# Patient Record
Sex: Male | Born: 1940 | Race: Asian | Hispanic: No | Marital: Married | Smoking: Never smoker
Health system: Southern US, Community
[De-identification: ages and names within clinical notes are randomized; demographics above are authoritative.]

## PROBLEM LIST (undated history)

## (undated) DIAGNOSIS — K5000 Crohn's disease of small intestine without complications: Secondary | ICD-10-CM

## (undated) DIAGNOSIS — I10 Essential (primary) hypertension: Secondary | ICD-10-CM

## (undated) DIAGNOSIS — E785 Hyperlipidemia, unspecified: Secondary | ICD-10-CM

## (undated) DIAGNOSIS — K3580 Unspecified acute appendicitis: Secondary | ICD-10-CM

## (undated) DIAGNOSIS — N400 Enlarged prostate without lower urinary tract symptoms: Secondary | ICD-10-CM

## (undated) HISTORY — PX: HERNIA REPAIR: SHX51

## (undated) HISTORY — DX: Essential (primary) hypertension: I10

## (undated) HISTORY — DX: Unspecified acute appendicitis: K35.80

## (undated) HISTORY — DX: Hyperlipidemia, unspecified: E78.5

## (undated) HISTORY — DX: Benign prostatic hyperplasia without lower urinary tract symptoms: N40.0

---

## 1998-03-08 ENCOUNTER — Ambulatory Visit (HOSPITAL_COMMUNITY): Admission: RE | Admit: 1998-03-08 | Discharge: 1998-03-08 | Payer: Self-pay | Admitting: *Deleted

## 1998-03-20 ENCOUNTER — Ambulatory Visit (HOSPITAL_COMMUNITY): Admission: RE | Admit: 1998-03-20 | Discharge: 1998-03-20 | Payer: Self-pay | Admitting: Family Medicine

## 1998-04-03 ENCOUNTER — Ambulatory Visit (HOSPITAL_COMMUNITY): Admission: RE | Admit: 1998-04-03 | Discharge: 1998-04-03 | Payer: Self-pay | Admitting: Family Medicine

## 1998-05-02 ENCOUNTER — Ambulatory Visit (HOSPITAL_COMMUNITY): Admission: RE | Admit: 1998-05-02 | Discharge: 1998-05-02 | Payer: Self-pay | Admitting: Family Medicine

## 2000-02-04 ENCOUNTER — Ambulatory Visit (HOSPITAL_COMMUNITY): Admission: RE | Admit: 2000-02-04 | Discharge: 2000-02-04 | Payer: Self-pay | Admitting: *Deleted

## 2000-02-04 ENCOUNTER — Encounter: Payer: Self-pay | Admitting: *Deleted

## 2007-06-28 ENCOUNTER — Ambulatory Visit (HOSPITAL_BASED_OUTPATIENT_CLINIC_OR_DEPARTMENT_OTHER): Admission: RE | Admit: 2007-06-28 | Discharge: 2007-06-28 | Payer: Self-pay | Admitting: Surgery

## 2011-01-20 NOTE — Op Note (Signed)
NAMEPSALM, Erik Aguirre                   ACCOUNT NO.:  [id number]   MEDICAL RECORD NO.:  [id number]          PATIENT TYPE:  AMB   LOCATION:  [facility]                         FACILITY:  [facility]   PHYSICIAN:  Wilmon Arms. Corliss Skains, M.D. DATE OF BIRTH:  Aug 25, 1941   DATE OF PROCEDURE:  06/28/2007  DATE OF DISCHARGE:                               OPERATIVE REPORT   PREOPERATIVE DIAGNOSIS:  Recurrent left inguinal hernia.   POSTOPERATIVE DIAGNOSIS:  Recurrent left inguinal hernia.   PROCEDURE PERFORMED:  Open repair of left inguinal hernia with mesh   SURGEON:  Wilmon Arms. Corliss Skains, M.D., FACS   ANESTHESIA:  General.   INDICATIONS:  The patient is a 70 year old male who underwent a  laparoscopic left inguinal hernia repair in [place], [place] in 1995.  Apparently the patient recurred his hernia in 2000 and noted a bulge at  that time.  For some reason he chose not to have anything done about it  for the last eight years.  It has gradually become much larger and has  caused more discomfort.  He finally presents for repair.   DESCRIPTION OF PROCEDURE:  The patient brought to the operating room,  placed in supine position on the operating room table.  After an  adequate level of general anesthesia was obtained, the patient's left  groin was shaved, prepped with Betadine and draped in sterile fashion.  A time-out was taken ensure proper patient, proper procedure.  The  patient's left groin was infiltrated with 0.25% Marcaine.  Then an  incision was made above the inguinal ligament.  Dissection was carried  down the external oblique fascia.  The fascia was examined and there was  a split noted in the external oblique fascia.  This area was opened  along direction of its fibers down to the external ring.  Blunt  dissection was used to dissect around the spermatic cord.  This was  retracted with a Penrose drain.  A large direct defect was noted.  Careful blunt as well as cautery dissection was used to free up  the  direct hernia sac.  This was densely adherent to the spermatic cord.  The spermatic cord was carefully skeletonized.  I could identify a  hernia sac which seemed to be ligated with a metallic clip.  However,  this appeared to be the distal portion of the hernia sac heading down  into the scrotum.  We did not have records from the patient's previous  operation but it appears that they may have divided his indirect hernia  sac and ligated it with a clip.  This portion of the hernia sac was  allowed to retract back down into the scrotum.  We further skeletonized  the spermatic cord looking for more proximal internal hernia sac and  none was identified.  Therefore we repaired the direct defect by  imbricating the floor of the inguinal canal with a 0 PDS suture.  3  inches x 6 inches Ultra Pro mesh was cut into keyhole shape and used to  reinforce the floor of the inguinal canal.  It  was secured beginning at  the pubic tubercle with 2-0 Vicryl sutures.  This was attached to the  shelving edge inferiorly and the internal oblique fascia superiorly.  The tails were tucked around the spermatic cord, sewn together and  tucked underneath the external oblique fascia.  The fascia was  reapproximated with 2-0 Vicryl in running fashion.  3-0 Vicryls used to  close the subcutaneous tissues and 4-0 Monocryl was used to close the  skin.  Steri-Strips and clean dressings were applied.  The patient was  then extubated and brought to recovery in stable condition.  All sponge,  instrument, needle counts were correct.      Wilmon Arms. Tsuei, M.D.  Electronically Signed     MKT/MEDQ  D:  06/28/2007  T:  06/29/2007  Job:  [id number]

## 2011-06-17 LAB — I-STAT 8, (EC8 V) (CONVERTED LAB)
Acid-Base Excess: 2
BUN: 25 — ABNORMAL HIGH
Bicarbonate: 27.9 — ABNORMAL HIGH
Chloride: 105
Glucose, Bld: 100 — ABNORMAL HIGH
HCT: 45
Hemoglobin: 15.3
Operator id: 268271
Potassium: 4.2
Sodium: 139
TCO2: 29
pCO2, Ven: 48.2
pH, Ven: 7.371 — ABNORMAL HIGH

## 2013-03-17 DIAGNOSIS — N138 Other obstructive and reflux uropathy: Secondary | ICD-10-CM | POA: Insufficient documentation

## 2013-03-17 DIAGNOSIS — N401 Enlarged prostate with lower urinary tract symptoms: Secondary | ICD-10-CM | POA: Insufficient documentation

## 2013-03-17 DIAGNOSIS — N400 Enlarged prostate without lower urinary tract symptoms: Secondary | ICD-10-CM | POA: Insufficient documentation

## 2013-03-17 DIAGNOSIS — R350 Frequency of micturition: Secondary | ICD-10-CM | POA: Insufficient documentation

## 2013-08-08 ENCOUNTER — Encounter: Payer: Self-pay | Admitting: Internal Medicine

## 2013-08-09 ENCOUNTER — Ambulatory Visit (INDEPENDENT_AMBULATORY_CARE_PROVIDER_SITE_OTHER): Payer: Medicare Other | Admitting: Internal Medicine

## 2013-08-09 ENCOUNTER — Encounter: Payer: Self-pay | Admitting: Internal Medicine

## 2013-08-09 ENCOUNTER — Encounter (INDEPENDENT_AMBULATORY_CARE_PROVIDER_SITE_OTHER): Payer: Self-pay

## 2013-08-09 VITALS — BP 140/84 | HR 74 | Temp 97.9°F | Ht 65.0 in | Wt 123.0 lb

## 2013-08-09 DIAGNOSIS — R058 Other specified cough: Secondary | ICD-10-CM

## 2013-08-09 DIAGNOSIS — R05 Cough: Secondary | ICD-10-CM

## 2013-08-09 DIAGNOSIS — R059 Cough, unspecified: Secondary | ICD-10-CM

## 2013-08-09 DIAGNOSIS — R918 Other nonspecific abnormal finding of lung field: Secondary | ICD-10-CM

## 2013-08-09 NOTE — Patient Instructions (Addendum)
The nodule is tiny and not likely to be anything at all but needs a limited CT scan in 6-12  months per Dr Andria Meuse (I will contact her to arrange)  As long as having any cough or throat congestion, take pepcid 20 mg after breakfast and bedtime  GERD (REFLUX)  is an extremely common cause of respiratory symptoms, many times with no significant heartburn at all.    It can be treated with medication, but also with lifestyle changes including avoidance of late meals, excessive alcohol, smoking cessation, and avoid fatty foods, chocolate, peppermint, colas, red wine, and acidic juices such as orange juice.  NO MINT OR MENTHOL PRODUCTS SO NO COUGH DROPS  USE SUGARLESS CANDY INSTEAD (jolley ranchers or Stover's)  NO OIL BASED VITAMINS - use powdered substitutes - NO FISH OIL

## 2013-08-09 NOTE — Progress Notes (Signed)
   Subjective:    Patient ID: Erik Aguirre, male    DOB: [birth year]   MRN: [id number]  HPI  72 yo male montagnard never smoker  referred to pulmonary clinic 08/09/13 by Dr Erik Aguirre for cough since Oct 2014 with abn cxr.    08/09/2013 1st [facility] office visit/ Erik Aguirre cc acute onset cough Oct 14th 2014  worse when lie down productive of yellow mucus better p abx but still some intermittent throat congestion at bedtime.    No obvious day to day or daytime variabilty or assoc sobor cp or chest tightness, subjective wheeze overt sinus or hb symptoms. No unusual exp hx or h/o childhood pna/ asthma or knowledge of premature birth.  Sleeping ok without nocturnal  or early am exacerbation  of respiratory  c/o's or need for noct saba. Also denies any obvious fluctuation of symptoms with weather or environmental changes or other aggravating or alleviating factors except as outlined above   Current Medications, Allergies, Complete Past Medical History, Past Surgical History, Family History, and Social History were reviewed in [employer] record.         Review of Systems  Constitutional: Positive for appetite change and unexpected weight change. Negative for fever, chills and activity change.  HENT: Negative for congestion, dental problem, postnasal drip, rhinorrhea, sneezing, sore throat, trouble swallowing and voice change.   Eyes: Negative for visual disturbance.  Respiratory: Negative for cough, choking and shortness of breath.   Cardiovascular: Negative for chest pain and leg swelling.  Gastrointestinal: Negative for nausea, vomiting and abdominal pain.  Genitourinary: Negative for difficulty urinating.  Musculoskeletal: Negative for arthralgias.  Skin: Negative for rash.  Psychiatric/Behavioral: Negative for behavioral problems and confusion.       Objective:   Physical Exam  amb vietnamese male nad  Wt Readings from Last 3 Encounters:  08/09/13  123 lb (55.792 kg)      HEENT: nl dentition, turbinates, and orophanx. Nl external ear canals without cough reflex   NECK :  without JVD/Nodes/TM/ nl carotid upstrokes bilaterally   LUNGS: no acc muscle use, clear to A and P bilaterally without cough on insp or exp maneuvers   CV:  RRR  no s3 or murmur or increase in P2, no edema   ABD:  soft and nontender with nl excursion in the supine position. No bruits or organomegaly, bowel sounds nl  MS:  warm without deformities, calf tenderness, cyanosis or clubbing  SKIN: warm and dry without lesions    NEURO:  alert, approp, no deficits         Assessment & Plan:

## 2013-08-11 DIAGNOSIS — R918 Other nonspecific abnormal finding of lung field: Secondary | ICD-10-CM | POA: Insufficient documentation

## 2013-08-11 DIAGNOSIS — R058 Other specified cough: Secondary | ICD-10-CM | POA: Insufficient documentation

## 2013-08-11 DIAGNOSIS — R05 Cough: Secondary | ICD-10-CM | POA: Insufficient documentation

## 2013-08-11 NOTE — Assessment & Plan Note (Signed)
See CT Chest 07/31/13 from [facility] c/w granulomas  Very unlikely to be Ca in this never smoker with central calcification in the larger nodulee but to be sure reasonable to repeat CT in 6-12 months (placed in our tickle file to be sure this gets done but will derfer to Dr Justice Britain to schedule through [facility] again for apples to apples comparison

## 2013-08-11 NOTE — Assessment & Plan Note (Addendum)
Classic Upper airway cough syndrome, so named because it's frequently impossible to sort out how much is  CR/sinusitis with freq throat clearing (which can be related to primary GERD)   vs  causing  secondary (" extra esophageal")  GERD from wide swings in gastric pressure that occur with throat clearing, often  promoting self use of mint and menthol lozenges that reduce the lower esophageal sphincter tone and exacerbate the problem further in a cyclical fashion.   These are the same pts (now being labeled as having "irritable larynx syndrome" by some cough centers) who not infrequently have a history of having failed to tolerate ace inhibitors,  dry powder inhalers or biphosphonates or report having atypical reflux symptoms that don't respond to standard doses of PPI , and are easily confused as having aecopd or asthma flares by even experienced allergists/ pulmonologists.   rec add pepcid prn and follow diet, f/u if not better   See instructions for specific recommendations which were reviewed directly with the patient who was given a copy with highlighter outlining the key components.

## 2013-08-22 ENCOUNTER — Ambulatory Visit (INDEPENDENT_AMBULATORY_CARE_PROVIDER_SITE_OTHER): Payer: Medicare Other | Admitting: Family Medicine

## 2013-08-22 VITALS — BP 130/72 | HR 72 | Temp 97.7°F | Resp 18 | Ht 65.5 in | Wt 120.0 lb

## 2013-08-22 DIAGNOSIS — R634 Abnormal weight loss: Secondary | ICD-10-CM

## 2013-08-22 DIAGNOSIS — R195 Other fecal abnormalities: Secondary | ICD-10-CM

## 2013-08-22 DIAGNOSIS — R05 Cough: Secondary | ICD-10-CM

## 2013-08-22 DIAGNOSIS — Z8639 Personal history of other endocrine, nutritional and metabolic disease: Secondary | ICD-10-CM

## 2013-08-22 DIAGNOSIS — Z8709 Personal history of other diseases of the respiratory system: Secondary | ICD-10-CM

## 2013-08-22 DIAGNOSIS — Z87898 Personal history of other specified conditions: Secondary | ICD-10-CM

## 2013-08-22 DIAGNOSIS — Z862 Personal history of diseases of the blood and blood-forming organs and certain disorders involving the immune mechanism: Secondary | ICD-10-CM

## 2013-08-22 DIAGNOSIS — R059 Cough, unspecified: Secondary | ICD-10-CM

## 2013-08-22 DIAGNOSIS — E782 Mixed hyperlipidemia: Secondary | ICD-10-CM

## 2013-08-22 LAB — POCT CBC
Granulocyte percent: 68.3 %G (ref 37–80)
HCT, POC: 48.8 % (ref 43.5–53.7)
Hemoglobin: 14.9 g/dL (ref 14.1–18.1)
Lymph, poc: 1.9 (ref 0.6–3.4)
MCH, POC: 30.1 pg (ref 27–31.2)
MCHC: 30.5 g/dL — AB (ref 31.8–35.4)
MCV: 98.5 fL — AB (ref 80–97)
MID (cbc): 0.4 (ref 0–0.9)
MPV: 8.5 fL (ref 0–99.8)
POC Granulocyte: 4.9 (ref 2–6.9)
POC LYMPH PERCENT: 26.4 %L (ref 10–50)
POC MID %: 5.3 %M (ref 0–12)
Platelet Count, POC: 270 10*3/uL (ref 142–424)
RBC: 4.95 M/uL (ref 4.69–6.13)
RDW, POC: 14.8 %
WBC: 7.2 10*3/uL (ref 4.6–10.2)

## 2013-08-22 LAB — COMPREHENSIVE METABOLIC PANEL
ALT: 14 U/L (ref 0–53)
Albumin: 4.1 g/dL (ref 3.5–5.2)
CO2: 30 mEq/L (ref 19–32)
Calcium: 9.4 mg/dL (ref 8.4–10.5)
Chloride: 102 mEq/L (ref 96–112)
Potassium: 4.3 mEq/L (ref 3.5–5.3)
Sodium: 142 mEq/L (ref 135–145)
Total Protein: 6.7 g/dL (ref 6.0–8.3)

## 2013-08-22 LAB — LIPID PANEL: Cholesterol: 199 mg/dL (ref 0–200)

## 2013-08-22 LAB — IFOBT (OCCULT BLOOD): IFOBT: POSITIVE

## 2013-08-22 LAB — POCT URINALYSIS DIPSTICK
Bilirubin, UA: NEGATIVE
Glucose, UA: NEGATIVE
Ketones, UA: NEGATIVE
Leukocytes, UA: NEGATIVE
Nitrite, UA: NEGATIVE
Protein, UA: NEGATIVE
Spec Grav, UA: 1.02
Urobilinogen, UA: 0.2
pH, UA: 7

## 2013-08-22 LAB — POCT UA - MICROSCOPIC ONLY
Bacteria, U Microscopic: NEGATIVE
Casts, Ur, LPF, POC: NEGATIVE
Crystals, Ur, HPF, POC: NEGATIVE
Mucus, UA: NEGATIVE
RBC, urine, microscopic: NEGATIVE
WBC, Ur, HPF, POC: NEGATIVE
Yeast, UA: NEGATIVE

## 2013-08-22 LAB — GLUCOSE, POCT (MANUAL RESULT ENTRY): POC Glucose: 102 mg/dl — AB (ref 70–99)

## 2013-08-22 LAB — POCT GLYCOSYLATED HEMOGLOBIN (HGB A1C): Hemoglobin A1C: 6

## 2013-08-22 NOTE — Progress Notes (Signed)
Subjective: 72 year old [place] man who is here with a history of having lost about 10 pounds of weight over the last year unexplained. He eats well. He does not feel bad. He has been to a Dr. Andria Meuse who saw him previously, but due to changes in his insurance that Dr. is no longer in his network so he is coming here. 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 The patient had been set to see Dr. Sherene Sires who recommended a followup CT in a proximal to 6 months. The patient also had a chronic cough. Dr. Sherene Sires placed him on some Pepcid for that, and it is improved some though he still has a little problem there. He has no other things going on that he knows of to cause him to lose weight. He has been told he might be a borderline diabetic. He also does have some nocturia 2 or 3 times.  Dr. Elnoria Howard did a screening colonoscopy on him in 2011 and was told to recheck in 10 years  HEENT unremarkable with no complaints. Cardiovascular no complaints. Respiratory no complaints except as noted above. GI no complaints. Eats well. GU has to get up 2-3 times at night to urinate but otherwise is well. Musculoskeletal unremarkable and he does get regular exercise. Dermatologic unremarkable  Objective: Pleasant alert gentleman in no major distress. His TMs are normal. Eyes PERRLA. He does have xanthelasma on both of his lower orbital rims, more on the left than the right. His throat is clear. Neck supple without significant nodes. Chest is clear to auscultation. Heart regular without murmurs. Abdomen soft without masses or tenderness. Good femoral pulses. Normal male external genitalia with testes descended. No hernias are noted. No masses noted. Digital rectal exam reveals prostate gland B. mildly enlarged but no nodules. Stool was checked for occult blood. Extremities are unremarkable.  Assessment: Weight loss History of pulmonary nodule, to be rechecked  in 6 months History of hyperglycemia Xanthelasma Mild BPH with mild nocturia Stool positive for occult blood ( noted he has had a colonoscopy 3 years ago)  Plan: Check labs including a CBC, glucose, he was sure, hemoglobin A1c, urinalysis, C. met, TSH, and lipids to assess the weight loss.  Results for orders placed in visit on 08/22/13  POCT CBC      Result Value Range   WBC 7.2  4.6 - 10.2 K/uL   Lymph, poc 1.9  0.6 - 3.4   POC LYMPH PERCENT 26.4  10 - 50 %L   MID (cbc) 0.4  0 - 0.9   POC MID % 5.3  0 - 12 %M   POC Granulocyte 4.9  2 - 6.9   Granulocyte percent 68.3  37 - 80 %G   RBC 4.95  4.69 - 6.13 M/uL   Hemoglobin 14.9  14.1 - 18.1 g/dL   HCT, POC 16.1  09.6 - 53.7 %   MCV 98.5 (*) 80 - 97 fL   MCH, POC 30.1  27 - 31.2 pg   MCHC 30.5 (*) 31.8 - 35.4 g/dL   RDW, POC 04.5     Platelet Count, POC 270  142 - 424 K/uL   MPV 8.5  0 - 99.8 fL  GLUCOSE, POCT (MANUAL RESULT ENTRY)      Result Value Range   POC Glucose 102 (*) 70 - 99 mg/dl  IFOBT (OCCULT BLOOD)      Result Value Range  IFOBT Positive    POCT GLYCOSYLATED HEMOGLOBIN (HGB A1C)      Result Value Range   Hemoglobin A1C 6.0    POCT UA - MICROSCOPIC ONLY      Result Value Range   WBC, Ur, HPF, POC neg     RBC, urine, microscopic neg     Bacteria, U Microscopic neg     Mucus, UA neg     Epithelial cells, urine per micros 0-3     Crystals, Ur, HPF, POC neg     Casts, Ur, LPF, POC neg     Yeast, UA neg    POCT URINALYSIS DIPSTICK      Result Value Range   Color, UA yellow     Clarity, UA clear     Glucose, UA neg     Bilirubin, UA neg     Ketones, UA neg     Spec Grav, UA 1.020     Blood, UA trace-lysed     pH, UA 7.0     Protein, UA neg     Urobilinogen, UA 0.2     Nitrite, UA neg     Leukocytes, UA Negative     Return in 3 months  Even though the occult blood was positive I do not feel like additional: Workup is indicated yet since he had a normal exam 3 years ago. However if he continues to  lose weight that may become necessary.

## 2013-08-22 NOTE — Patient Instructions (Signed)
I will let you know the rest of your tests in a few days when they come back from the lab   Return in 3 months for a recheck  Please try to make yourself he asked her her, especially things like more rice or more fried foods which will give you more calories.  Return if worse at any time.

## 2013-08-24 ENCOUNTER — Encounter: Payer: Self-pay | Admitting: Family Medicine

## 2013-08-28 ENCOUNTER — Telehealth: Payer: Self-pay | Admitting: Family Medicine

## 2013-08-28 NOTE — Telephone Encounter (Signed)
Patient call about labs could you please look at them so we can call patient about labs

## 2013-08-29 NOTE — Telephone Encounter (Signed)
Call: Call patient. The labs were good overall. He needs to be drinking more water. Mail them a copy of the labs. Return at any time if further problems

## 2013-08-30 NOTE — Telephone Encounter (Signed)
Pt.notified

## 2013-09-06 ENCOUNTER — Ambulatory Visit (INDEPENDENT_AMBULATORY_CARE_PROVIDER_SITE_OTHER): Payer: Medicare Other | Admitting: Family Medicine

## 2013-09-06 VITALS — BP 130/80 | HR 64 | Temp 98.0°F | Resp 18 | Ht 64.75 in | Wt 120.0 lb

## 2013-09-06 DIAGNOSIS — R059 Cough, unspecified: Secondary | ICD-10-CM

## 2013-09-06 DIAGNOSIS — R05 Cough: Secondary | ICD-10-CM

## 2013-09-06 MED ORDER — HYDROCODONE-HOMATROPINE 5-1.5 MG/5ML PO SYRP: 5.0000 mL | ORAL_SOLUTION | ORAL | Status: DC | PRN

## 2013-09-06 MED ORDER — AMOXICILLIN 875 MG PO TABS: 875.0000 mg | ORAL_TABLET | Freq: Two times a day (BID) | ORAL | Status: DC

## 2013-09-06 NOTE — Patient Instructions (Addendum)
Stop the Pepcid for now. If you're doing worse resumed taking it.  Take the cough syrup 1 teaspoon every 4-6 hours when needed for coughing. It will make you sleepy, so best to use at nighttime.  Get plenty of rest and drink lots of water, tea, juice  If you're not feeling better by Friday, began taking the antibiotic amoxicillin. Do not get it filled yet, but wait to see if you're doing better with just the cough syrup.

## 2013-09-06 NOTE — Progress Notes (Signed)
Subjective: 72 year old patient known to me who started having a cough on Saturday. Buddy daily and today it is been worse. The cough bothers him at nighttime. He is productive of some slight phlegm. He does not smoke. He does not feel bad or have a sore throat.  Last time he was in here we were concerned about his weight loss. He has not lost any more weight in the last couple of weeks which is encouraging.  Objective: TMs are normal. Throat clear but mildly erythematous neck supple without significant nodes. Chest is clear. Heart regular without murmurs.  Assessment: Cough, probable viral etiology  Plan: Hycodan cough syrup Drink lots of fluids If not improving by Friday we will start him on an antibiotic but I do not think it is necessary at this time.

## 2013-12-20 ENCOUNTER — Ambulatory Visit (INDEPENDENT_AMBULATORY_CARE_PROVIDER_SITE_OTHER): Payer: Medicare HMO | Admitting: Family Medicine

## 2013-12-20 VITALS — BP 128/80 | HR 80 | Temp 97.3°F | Resp 16 | Ht 64.5 in | Wt 123.4 lb

## 2013-12-20 DIAGNOSIS — R9389 Abnormal findings on diagnostic imaging of other specified body structures: Secondary | ICD-10-CM

## 2013-12-20 DIAGNOSIS — R7309 Other abnormal glucose: Secondary | ICD-10-CM

## 2013-12-20 DIAGNOSIS — I10 Essential (primary) hypertension: Secondary | ICD-10-CM

## 2013-12-20 DIAGNOSIS — K921 Melena: Secondary | ICD-10-CM

## 2013-12-20 LAB — BASIC METABOLIC PANEL
BUN: 15 mg/dL (ref 6–23)
CO2: 29 mEq/L (ref 19–32)
CREATININE: 1.2 mg/dL (ref 0.50–1.35)
Calcium: 9.5 mg/dL (ref 8.4–10.5)
Chloride: 102 mEq/L (ref 96–112)
GLUCOSE: 102 mg/dL — AB (ref 70–99)
Potassium: 4.9 mEq/L (ref 3.5–5.3)
Sodium: 139 mEq/L (ref 135–145)

## 2013-12-20 LAB — POCT GLYCOSYLATED HEMOGLOBIN (HGB A1C): Hemoglobin A1C: 6

## 2013-12-20 MED ORDER — ATENOLOL-CHLORTHALIDONE 50-25 MG PO TABS: 1.0000 | ORAL_TABLET | Freq: Every day | ORAL | Status: DC

## 2013-12-20 NOTE — Progress Notes (Signed)
[facility] [address], [place] [address] [phone]  Date:  12/20/2013   Name:  Erik MangoKien P Winemiller   DOB:  1940/11/06   MRN:  [id number]  PCP:  Dow AdolphSTEVENS, LISA LAJUANA, PA    Chief Complaint: Medication Refill   History of Present Illness:  Erik Aguirre is a 73 y.o. very pleasant male patient who presents with the following: needs medication refill of atenolol/ chlorthlidone.  Also states he needs follow-up from an abnormal chest x-ray that was taken 6 mths ago showing a nodule.  States cough has improved and he is no longer losing weight.  No complaints today.   Upon chart review it looks like he saw Dr. Sherene SiresWert in the fall, and the plan was to repeat a CT scan of his chest.  CT of his chest from [facility] from 07/2013 is scanned into chart and recommended a follow-up CT in 3 months.   Otherwise he is feeling well and has no other concerns today  Also noted that he did have a positive FOBT in the past and it does not appear that he has had a colonoscopy.   Patient Active Problem List   Diagnosis Date Noted  . 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 Upper airway cough syndrome 08/11/2013    Past Medical History  Diagnosis Date  . Hypertension     History reviewed. No pertinent past surgical history.  History  Substance Use Topics  . Smoking status: Never Smoker   . Smokeless tobacco: Never Used  . Alcohol Use: No    Family History  Problem Relation Age of Onset  . Throat cancer Brother   . Asthma Son     No Known Allergies  Medication list has been reviewed and updated.  Current Outpatient Prescriptions on File Prior to Visit  Medication Sig Dispense Refill  . atenolol-chlorthalidone (TENORETIC) 50-25 MG per tablet Take 1 tablet by mouth daily.      Marland Kitchen. amoxicillin (AMOXIL) 875 MG tablet Take 1 tablet (875 mg total) by mouth 2 (two) times daily.  14 tablet  0  . famotidine (PEPCID) 20 MG tablet Take 20 mg by mouth 2 (two) times daily.      Marland Kitchen.  HYDROcodone-homatropine (HYCODAN) 5-1.5 MG/5ML syrup Take 5 mLs by mouth every 4 (four) hours as needed for cough.  120 mL  0   No current facility-administered medications on file prior to visit.    Review of Systems: Gen: denies fevers, wt loss, night sweats Cardio - denies chest pain, palp  Resp- denies sob, cough, wheeze GI -denies nausea, vomiting, and bloody stools Ext- denies muscle pain or weakness  Wt Readings from Last 3 Encounters:  12/20/13 123 lb 6.4 oz (55.974 kg)  09/06/13 120 lb (54.432 kg)  08/22/13 120 lb (54.432 kg)     Physical Examination: Filed Vitals:   12/20/13 1057  BP: 128/80  Pulse: 80  Temp: 97.3 F (36.3 C)  Resp: 16   Filed Vitals:   12/20/13 1057  Height: 5' 4.5" (1.638 m)  Weight: 123 lb 6.4 oz (55.974 kg)   Body mass index is 20.86 kg/(m^2). Ideal Body Weight: Weight in (lb) to have BMI = 25: 147.6  GEN: WDWN, NAD, Non-toxic, A & O x 3, slim build, looks well HEENT: Atraumatic, Normocephalic. Neck supple. No masses, No LAD. External cholesterol deposits visible under both eyes Ears and Nose: No external deformity. CV: RRR, No M/G/R. No JVD. No thrill. No extra  heart sounds. PULM: CTA B, no wheezes, crackles, rhonchi. No retractions. No resp. distress. No accessory muscle use. EXTR: No c/c/e.  5/5 strength in upper and lower extremities.  PSYCH: Normally interactive. Conversant. Not depressed or anxious appearing.  Calm demeanor.   Results for orders placed in visit on 12/20/13  POCT GLYCOSYLATED HEMOGLOBIN (HGB A1C)      Result Value Ref Range   Hemoglobin A1C 6.0      Assessment and Plan:  HTN (hypertension) - Plan: atenolol-chlorthalidone (TENORETIC) 50-25 MG per tablet, Basic metabolic panel  Elevated glucose - Plan: POCT glycosylated hemoglobin (Hb A1C)  Blood in stool - Plan: Ambulatory referral to Gastroenterology  Abnormal chest CT - Plan: CT Chest Wo Contrast   1. Hypertension -  Refill atenolol/ chlorthalidone.   Check BMP.  BP looks fine.  2. Positive FOBT - refer to GI 3. Pulm nodules - repeat CT per pulmonologist recs  4. Reassured that his hemoglobin A1c is stable. Continue exercise and await BMP  Signed Abbe AmsterdamJessica Copland, MD

## 2013-12-20 NOTE — Patient Instructions (Addendum)
We will order your follow-up CT chest- we will give you a call about this.  Your blood pressure looks good- continue to take your medication and we will be in touch with your other labs asap  Your hemoglobin a1c continues to show that your glucose is borderline- keep working on your diet and we can check again in 6 months.

## 2013-12-21 ENCOUNTER — Encounter: Payer: Self-pay | Admitting: Family Medicine

## 2013-12-29 ENCOUNTER — Ambulatory Visit
Admission: RE | Admit: 2013-12-29 | Discharge: 2013-12-29 | Disposition: A | Discharge status: Home / Self Care | Payer: Commercial Managed Care - HMO | Source: Ambulatory Visit | Attending: Family Medicine | Admitting: Family Medicine

## 2013-12-29 DIAGNOSIS — R9389 Abnormal findings on diagnostic imaging of other specified body structures: Secondary | ICD-10-CM

## 2013-12-31 ENCOUNTER — Other Ambulatory Visit: Payer: Self-pay | Admitting: Family Medicine

## 2013-12-31 ENCOUNTER — Encounter: Payer: Self-pay | Admitting: Family Medicine

## 2013-12-31 DIAGNOSIS — R918 Other nonspecific abnormal finding of lung field: Secondary | ICD-10-CM

## 2014-01-09 ENCOUNTER — Ambulatory Visit (INDEPENDENT_AMBULATORY_CARE_PROVIDER_SITE_OTHER): Payer: Medicare HMO | Admitting: Family Medicine

## 2014-01-09 VITALS — BP 124/76 | HR 65 | Temp 97.6°F | Resp 16 | Ht 63.75 in | Wt 124.0 lb

## 2014-01-09 DIAGNOSIS — R05 Cough: Secondary | ICD-10-CM

## 2014-01-09 DIAGNOSIS — R059 Cough, unspecified: Secondary | ICD-10-CM

## 2014-01-09 LAB — POCT CBC
Granulocyte percent: 61.1 %G (ref 37–80)
HEMATOCRIT: 39.3 % — AB (ref 43.5–53.7)
HEMOGLOBIN: 12.8 g/dL — AB (ref 14.1–18.1)
Lymph, poc: 2.5 (ref 0.6–3.4)
MCH: 30.9 pg (ref 27–31.2)
MCHC: 32.6 g/dL (ref 31.8–35.4)
MCV: 95 fL (ref 80–97)
MID (cbc): 0.5 (ref 0–0.9)
MPV: 7.6 fL (ref 0–99.8)
POC Granulocyte: 4.8 (ref 2–6.9)
POC LYMPH PERCENT: 32.1 %L (ref 10–50)
POC MID %: 6.8 %M (ref 0–12)
Platelet Count, POC: 313 10*3/uL (ref 142–424)
RBC: 4.14 M/uL — AB (ref 4.69–6.13)
RDW, POC: 13.3 %
WBC: 7.8 10*3/uL (ref 4.6–10.2)

## 2014-01-09 MED ORDER — BENZONATATE 100 MG PO CAPS: 100.0000 mg | ORAL_CAPSULE | Freq: Three times a day (TID) | ORAL | Status: DC | PRN

## 2014-01-09 NOTE — Progress Notes (Signed)
[facility] [address], [place] [address] [phone]  Date:  01/09/2014   Name:  Erik Aguirre   DOB:  [birth year]   MRN:  [id number]  PCP:  Dow AdolphSTEVENS, LISA LAJUANA, PA    Chief Complaint: Cough   History of Present Illness:  Erik Aguirre is a 73 y.o. very pleasant male patient who presents with the following:  He was seen here about 3 weeks ago for a BP check.  He is here today wih a cough for about 5 days. He is coughing up some mucus.  He has not noted a fever or chills.  He is coughing more at night.   He had a chest CT last month.   He has not noted a ST, runny nose, or earache.  No GI symptoms   Patient Active Problem List   Diagnosis Date Noted  . 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 Upper airway cough syndrome 08/11/2013    Past Medical History  Diagnosis Date  . Hypertension     No past surgical history on file.  History  Substance Use Topics  . Smoking status: Never Smoker   . Smokeless tobacco: Never Used  . Alcohol Use: No    Family History  Problem Relation Age of Onset  . Throat cancer Brother   . Asthma Son     Allergies  Allergen Reactions  . Lisinopril Cough    Medication list has been reviewed and updated.  Current Outpatient Prescriptions on File Prior to Visit  Medication Sig Dispense Refill  . atenolol-chlorthalidone (TENORETIC) 50-25 MG per tablet Take 1 tablet by mouth daily.  90 tablet  3  . famotidine (PEPCID) 20 MG tablet Take 20 mg by mouth 2 (two) times daily.      Marland Kitchen. amoxicillin (AMOXIL) 875 MG tablet Take 1 tablet (875 mg total) by mouth 2 (two) times daily.  14 tablet  0  . HYDROcodone-homatropine (HYCODAN) 5-1.5 MG/5ML syrup Take 5 mLs by mouth every 4 (four) hours as needed for cough.  120 mL  0   No current facility-administered medications on file prior to visit.    Review of Systems:  As per HPI- otherwise negative.   Physical Examination: Filed Vitals:   01/09/14 1802  BP: 124/76   Pulse: 65  Temp: 97.6 F (36.4 C)  Resp: 16   Filed Vitals:   01/09/14 1802  Height: 5' 3.75" (1.619 m)  Weight: 124 lb (56.246 kg)   Body mass index is 21.46 kg/(m^2). Ideal Body Weight: Weight in (lb) to have BMI = 25: 144.2  GEN: WDWN, NAD, Non-toxic, A & O x 3, looks well, slim build HEENT: Atraumatic, Normocephalic. Neck supple. No masses, No LAD.  Bilateral TM wnl, oropharynx normal.  PEERL,EOMI.   Ears and Nose: No external deformity. CV: RRR, No M/G/R. No JVD. No thrill. No extra heart sounds. PULM: CTA B, no wheezes, crackles, rhonchi. No retractions. No resp. distress. No accessory muscle use. EXTR: No c/c/e NEURO Normal gait.  PSYCH: Normally interactive. Conversant. Not depressed or anxious appearing.  Calm demeanor.   Results for orders placed in visit on 01/09/14  POCT CBC      Result Value Ref Range   WBC 7.8  4.6 - 10.2 K/uL   Lymph, poc 2.5  0.6 - 3.4   POC LYMPH PERCENT 32.1  10 - 50 %L   MID (cbc) 0.5  0 - 0.9   POC MID % 6.8  0 - 12 %M   POC Granulocyte 4.8  2 - 6.9   Granulocyte percent 61.1  37 - 80 %G   RBC 4.14 (*) 4.69 - 6.13 M/uL   Hemoglobin 12.8 (*) 14.1 - 18.1 g/dL   HCT, POC 56.239.3 (*) 13.043.5 - 53.7 %   MCV 95.0  80 - 97 fL   MCH, POC 30.9  27 - 31.2 pg   MCHC 32.6  31.8 - 35.4 g/dL   RDW, POC 86.513.3     Platelet Count, POC 313  142 - 424 K/uL   MPV 7.6  0 - 99.8 fL    Assessment and Plan: Cough - Plan: POCT CBC, benzonatate (TESSALON) 100 MG capsule  Likely viral illness.  Tessalon as needed.  He has been referred to GI as he is anemic.    See patient instructions for more details.    Signed Abbe AmsterdamJessica Copland, MD

## 2014-01-09 NOTE — Patient Instructions (Signed)
Use the tessalon perles as needed for cough.  It seems that you have a viral infection and will likely be better in a few days.  Let me know if you do not feel better in the next few days- Sooner if worse.

## 2014-01-22 ENCOUNTER — Encounter: Payer: Self-pay | Admitting: Family Medicine

## 2014-07-02 ENCOUNTER — Ambulatory Visit (INDEPENDENT_AMBULATORY_CARE_PROVIDER_SITE_OTHER): Payer: Medicare HMO | Admitting: Family Medicine

## 2014-07-02 VITALS — BP 128/76 | HR 64 | Temp 97.4°F | Resp 16 | Ht 66.0 in | Wt 127.4 lb

## 2014-07-02 DIAGNOSIS — I10 Essential (primary) hypertension: Secondary | ICD-10-CM

## 2014-07-02 DIAGNOSIS — I152 Hypertension secondary to endocrine disorders: Secondary | ICD-10-CM | POA: Insufficient documentation

## 2014-07-02 DIAGNOSIS — E1159 Type 2 diabetes mellitus with other circulatory complications: Secondary | ICD-10-CM | POA: Insufficient documentation

## 2014-07-02 DIAGNOSIS — R7303 Prediabetes: Secondary | ICD-10-CM

## 2014-07-02 DIAGNOSIS — E785 Hyperlipidemia, unspecified: Secondary | ICD-10-CM

## 2014-07-02 DIAGNOSIS — E119 Type 2 diabetes mellitus without complications: Secondary | ICD-10-CM | POA: Insufficient documentation

## 2014-07-02 DIAGNOSIS — R7309 Other abnormal glucose: Secondary | ICD-10-CM

## 2014-07-02 DIAGNOSIS — R918 Other nonspecific abnormal finding of lung field: Secondary | ICD-10-CM

## 2014-07-02 DIAGNOSIS — Z23 Encounter for immunization: Secondary | ICD-10-CM

## 2014-07-02 LAB — COMPREHENSIVE METABOLIC PANEL
ALT: 13 U/L (ref 0–53)
AST: 14 U/L (ref 0–37)
Albumin: 4.1 g/dL (ref 3.5–5.2)
Alkaline Phosphatase: 57 U/L (ref 39–117)
BUN: 18 mg/dL (ref 6–23)
CALCIUM: 9.5 mg/dL (ref 8.4–10.5)
CHLORIDE: 101 meq/L (ref 96–112)
CO2: 31 meq/L (ref 19–32)
CREATININE: 1.14 mg/dL (ref 0.50–1.35)
Glucose, Bld: 121 mg/dL — ABNORMAL HIGH (ref 70–99)
Potassium: 4.7 mEq/L (ref 3.5–5.3)
Sodium: 139 mEq/L (ref 135–145)
Total Bilirubin: 0.9 mg/dL (ref 0.2–1.2)
Total Protein: 7 g/dL (ref 6.0–8.3)

## 2014-07-02 LAB — HEMOGLOBIN A1C
Hgb A1c MFr Bld: 6.6 % — ABNORMAL HIGH (ref <5.7)
MEAN PLASMA GLUCOSE: 143 mg/dL — AB (ref <117)

## 2014-07-02 LAB — LIPID PANEL
CHOL/HDL RATIO: 4.9 ratio
Cholesterol: 194 mg/dL (ref 0–200)
HDL: 40 mg/dL (ref >39)
LDL Cholesterol: 109 mg/dL — ABNORMAL HIGH (ref 0–99)
Triglycerides: 224 mg/dL — ABNORMAL HIGH (ref <150)
VLDL: 45 mg/dL — ABNORMAL HIGH (ref 0–40)

## 2014-07-02 NOTE — Patient Instructions (Signed)
Your blood pressure looks fine.  Continue your medication for blood pressure We will set up a CT scan for your lungs and will be in touch with you I will also be in touch with the rest of your labs You got a flu shot and pneumonia shot today.

## 2014-07-02 NOTE — Progress Notes (Addendum)
[facility] [address], [place] [id number] [phone]  Date:  07/02/2014   Name:  Erik BargeKien Tsukamoto   DOB:  [birth year]   MRN:  [id number]  PCP:  Dow AdolphSTEVENS, LISA LAJUANA, PA    Chief Complaint: Diabetes, Hyperlipidemia and Immunizations   History of Present Illness:  Erik Aguirre is a 73 y.o. very pleasant male patient who presents with the following:  Here today to follow-up his chronic health isuses. He has HTN and pre- diabetes. He is fasting today for labs and would like a cholesterol check also  He is on tenoretic for his BP. He also notes that he had a CT of his chest a few months ago and though he was to do a follow-up, and this may now be due.    CT 12/29/2013 IMPRESSION:  1. Multiple tiny pulmonary nodules bilaterally, as above. These are  all nonspecific, with the largest lesion measuring 8 x 5 mm in the  superior segment of the right lower lobe. Given the central  calcification, this may represent an unusual appearing granuloma  with some surrounding scarring. However, follow-up imaging is  recommended to ensure stability of this finding, with a repeat chest  CT recommended in 6 months.  2. Atherosclerosis, including left main and 2 vessel coronary artery  disease. Assessment for potential risk factor modification, dietary  therapy or pharmacologic therapy may be warranted, if clinically  indicated.   He would like a flu shot and also needs prevnar   Lab Results  Component Value Date   HGBA1C 6.0 12/20/2013     Patient Active Problem List   Diagnosis Date Noted  . 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 Upper airway cough syndrome 08/11/2013    Past Medical History  Diagnosis Date  . Hypertension     History reviewed. No pertinent past surgical history.  History  Substance Use Topics  . Smoking status: Never Smoker   . Smokeless tobacco: Never Used  . Alcohol Use: No    Family History  Problem Relation Age of Onset  . Throat  cancer Brother   . Asthma Son     Allergies  Allergen Reactions  . Lisinopril Cough    Medication list has been reviewed and updated.  Current Outpatient Prescriptions on File Prior to Visit  Medication Sig Dispense Refill  . atenolol-chlorthalidone (TENORETIC) 50-25 MG per tablet Take 1 tablet by mouth daily.  90 tablet  3  . benzonatate (TESSALON) 100 MG capsule Take 1 capsule (100 mg total) by mouth 3 (three) times daily as needed for cough.  40 capsule  0  . famotidine (PEPCID) 20 MG tablet Take 20 mg by mouth 2 (two) times daily.       No current facility-administered medications on file prior to visit.    Review of Systems:  As per HPI- otherwise negative.   Physical Examination: Filed Vitals:   07/02/14 1037  BP: 128/76  Pulse: 64  Temp: 97.4 F (36.3 C)  Resp: 16   Filed Vitals:   07/02/14 1037  Height: 5\' 6"  (1.676 m)  Weight: 127 lb 6.4 oz (57.788 kg)   Body mass index is 20.57 kg/(m^2). Ideal Body Weight: Weight in (lb) to have BMI = 25: 154.6  GEN: WDWN, NAD, Non-toxic, A & O x 3, looks well, slim HEENT: Atraumatic, Normocephalic. Neck supple. No masses, No LAD. Ears and Nose: No external deformity. CV: RRR, No M/G/R. No JVD. No thrill. No extra heart sounds.  PULM: CTA B, no wheezes, crackles, rhonchi. No retractions. No resp. distress. No accessory muscle use. ABD: S, NT, ND EXTR: No c/c/e NEURO Normal gait.  PSYCH: Normally interactive. Conversant. Not depressed or anxious appearing.  Calm demeanor.    Assessment and Plan: Pre-diabetes - Plan: Hemoglobin A1c  Essential hypertension - Plan: Comprehensive metabolic panel  Dyslipidemia - Plan: Comprehensive metabolic panel, Lipid panel  Multiple pulmonary nodules - Plan: CT Chest W Contrast  Immunization due - Plan: Pneumococcal conjugate vaccine 13-valent IM, Flu Vaccine QUAD 36+ mos IM  Await labs and ordered CT for him today.   Will be in touch with labs. Also will need to send him a  reminder about colonoscopy with labs     Signed Abbe AmsterdamJessica Eytan Carrigan, MD  Called 10/27 to go over labs.  Recommended that we start a cholesterol medication and he is ok with this.  Will send pravachol to his drug store. Plan recheck in 4 months

## 2014-07-03 ENCOUNTER — Encounter: Payer: Self-pay | Admitting: Family Medicine

## 2014-07-03 MED ORDER — PRAVASTATIN SODIUM 20 MG PO TABS
20.0000 mg | ORAL_TABLET | Freq: Every day | ORAL | Status: DC
Start: 2014-07-03 — End: 2015-06-21

## 2014-07-03 NOTE — Addendum Note (Signed)
Addended by: Abbe AmsterdamOPLAND, Chanan Detwiler C on: 07/03/2014 12:49 PM   Modules accepted: Orders

## 2014-07-10 ENCOUNTER — Ambulatory Visit
Admission: RE | Admit: 2014-07-10 | Discharge: 2014-07-10 | Disposition: A | Discharge status: Home / Self Care | Payer: Commercial Managed Care - HMO | Source: Ambulatory Visit | Attending: Family Medicine | Admitting: Family Medicine

## 2014-07-10 DIAGNOSIS — R918 Other nonspecific abnormal finding of lung field: Secondary | ICD-10-CM

## 2014-07-10 MED ORDER — IOHEXOL 300 MG/ML  SOLN
75.0000 mL | Freq: Once | INTRAMUSCULAR | Status: AC | PRN
  Administered 2014-07-10: 75 mL via INTRAVENOUS

## 2014-07-16 ENCOUNTER — Telehealth: Payer: Self-pay | Admitting: Family Medicine

## 2014-07-16 DIAGNOSIS — R911 Solitary pulmonary nodule: Secondary | ICD-10-CM

## 2014-07-16 NOTE — Telephone Encounter (Signed)
Called to let him know that CT is stable, they do recommend a follow-up in 6 months that I will order.  He states understanding.

## 2014-07-27 ENCOUNTER — Other Ambulatory Visit: Payer: Self-pay | Admitting: Internal Medicine

## 2014-07-27 DIAGNOSIS — R918 Other nonspecific abnormal finding of lung field: Secondary | ICD-10-CM

## 2014-11-01 ENCOUNTER — Ambulatory Visit (INDEPENDENT_AMBULATORY_CARE_PROVIDER_SITE_OTHER): Payer: Commercial Managed Care - HMO | Admitting: Family Medicine

## 2014-11-01 VITALS — BP 122/74 | HR 57 | Temp 97.5°F | Resp 18 | Ht 65.5 in | Wt 127.0 lb

## 2014-11-01 DIAGNOSIS — R1013 Epigastric pain: Secondary | ICD-10-CM

## 2014-11-01 DIAGNOSIS — R7309 Other abnormal glucose: Secondary | ICD-10-CM

## 2014-11-01 DIAGNOSIS — R7303 Prediabetes: Secondary | ICD-10-CM

## 2014-11-01 DIAGNOSIS — E785 Hyperlipidemia, unspecified: Secondary | ICD-10-CM | POA: Insufficient documentation

## 2014-11-01 LAB — COMPREHENSIVE METABOLIC PANEL
ALBUMIN: 3.7 g/dL (ref 3.5–5.2)
ALK PHOS: 52 U/L (ref 39–117)
ALT: 16 U/L (ref 0–53)
AST: 18 U/L (ref 0–37)
BUN: 26 mg/dL — AB (ref 6–23)
CO2: 26 mEq/L (ref 19–32)
CREATININE: 1.28 mg/dL (ref 0.50–1.35)
Calcium: 9.2 mg/dL (ref 8.4–10.5)
Chloride: 98 mEq/L (ref 96–112)
Glucose, Bld: 185 mg/dL — ABNORMAL HIGH (ref 70–99)
Potassium: 4.4 mEq/L (ref 3.5–5.3)
Sodium: 136 mEq/L (ref 135–145)
Total Bilirubin: 0.4 mg/dL (ref 0.2–1.2)
Total Protein: 6.1 g/dL (ref 6.0–8.3)

## 2014-11-01 LAB — POCT CBC
Granulocyte percent: 81.5 %G — AB (ref 37–80)
HCT, POC: 45.3 % (ref 43.5–53.7)
HEMOGLOBIN: 14.5 g/dL (ref 14.1–18.1)
Lymph, poc: 1.8 (ref 0.6–3.4)
MCH, POC: 29.7 pg (ref 27–31.2)
MCHC: 32 g/dL (ref 31.8–35.4)
MCV: 92.9 fL (ref 80–97)
MID (cbc): 0.7 (ref 0–0.9)
MPV: 7.1 fL (ref 0–99.8)
POC GRANULOCYTE: 11.2 — AB (ref 2–6.9)
POC LYMPH PERCENT: 13.1 %L (ref 10–50)
POC MID %: 5.4 %M (ref 0–12)
Platelet Count, POC: 275 10*3/uL (ref 142–424)
RBC: 4.88 M/uL (ref 4.69–6.13)
RDW, POC: 13.9 %
WBC: 13.8 10*3/uL — AB (ref 4.6–10.2)

## 2014-11-01 LAB — AMYLASE: Amylase: 37 U/L (ref 0–105)

## 2014-11-01 LAB — LIPASE

## 2014-11-01 LAB — POCT GLYCOSYLATED HEMOGLOBIN (HGB A1C): Hemoglobin A1C: 6.3

## 2014-11-01 MED ORDER — GI COCKTAIL ~~LOC~~
30.0000 mL | Freq: Once | ORAL | Status: AC
  Administered 2014-11-01: 30 mL via ORAL

## 2014-11-01 MED ORDER — SUCRALFATE 1 G PO TABS
1.0000 g | ORAL_TABLET | Freq: Three times a day (TID) | ORAL | Status: DC
Start: 2014-11-01 — End: 2014-11-06

## 2014-11-01 NOTE — Patient Instructions (Signed)
I think that you may have a problem with your pancreas.  I will call you later on today with the rest of your labs and we will decide what to do next.   In the meantime eat a bland diet, drink plenty of liquids

## 2014-11-01 NOTE — Progress Notes (Signed)
[facility] [address], [place] [address] [phone]  Date:  11/01/2014   Name:  Erik Aguirre   DOB:  1940/09/22   MRN:  [id number]  PCP:  Dow Adolph, PA    Chief Complaint: Abdominal Pain   History of Present Illness:  Brandyn Lowrey is a 74 y.o. very pleasant male patient who presents with the following:  See last OV 07/01/14- noted to have pre-diabetes, HTN and dyslipidemia.  Most recent A1c 6%. He notes epigastric pain for about 3 days, which seems to be getting worse.  He has not noted any difference in his sx with eating.   No vomiting.  He did have diarrhea today- 3 stools.  He has tried some OTC pepto but it did not seem to help.   He does not note any burning in his chest or acid taste in his mouth.  No CP or SOB He has not had this in the past   Patient Active Problem List   Diagnosis Date Noted  . HTN (hypertension) 07/02/2014  . Pre-diabetes 07/02/2014  . 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 Upper airway cough syndrome 08/11/2013    Past Medical History  Diagnosis Date  . Hypertension     History reviewed. No pertinent past surgical history.  History  Substance Use Topics  . Smoking status: Never Smoker   . Smokeless tobacco: Never Used  . Alcohol Use: No    Family History  Problem Relation Age of Onset  . Throat cancer Brother   . Asthma Son     Allergies  Allergen Reactions  . Lisinopril Cough    Medication list has been reviewed and updated.  Current Outpatient Prescriptions on File Prior to Visit  Medication Sig Dispense Refill  . atenolol-chlorthalidone (TENORETIC) 50-25 MG per tablet Take 1 tablet by mouth daily. 90 tablet 3  . famotidine (PEPCID) 20 MG tablet Take 20 mg by mouth 2 (two) times daily.    . pravastatin (PRAVACHOL) 20 MG tablet Take 1 tablet (20 mg total) by mouth daily. 90 tablet 3  . benzonatate (TESSALON) 100 MG capsule Take 1 capsule (100 mg total) by mouth 3 (three) times daily as  needed for cough. (Patient not taking: Reported on 11/01/2014) 40 capsule 0   No current facility-administered medications on file prior to visit.    Review of Systems:  As per HPI- otherwise negative.   Physical Examination: Filed Vitals:   11/01/14 1422  BP: 122/74  Pulse: 57  Temp: 97.5 F (36.4 C)  Resp: 18   Filed Vitals:   11/01/14 1422  Height: 5' 5.5" (1.664 m)  Weight: 127 lb (57.607 kg)   Body mass index is 20.81 kg/(m^2). Ideal Body Weight: Weight in (lb) to have BMI = 25: 152.2  GEN: WDWN, NAD, Non-toxic, A & O x 3, slim build, looks well HEENT: Atraumatic, Normocephalic. Neck supple. No masses, No LAD.  Bilateral TM wnl, oropharynx normal.  PEERL,EOMI.   Ears and Nose: No external deformity. CV: RRR, No M/G/R. No JVD. No thrill. No extra heart sounds. PULM: CTA B, no wheezes, crackles, rhonchi. No retractions. No resp. distress. No accessory muscle use. ABD: S, NT, ND, +BS. No rebound. No HSM.  He endorses the epigastric/ LUQ as the area of discomfort but there is no reproducible tenderness  EXTR: No c/c/e NEURO Normal gait.  PSYCH: Normally interactive. Conversant. Not depressed or anxious appearing.  Calm demeanor.   Reviewed CT of his  chest from 07/2014.  Discussed with radiologist reading CT body today.  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 but OW normal, no sign of AAA at this level GI cocktail: reduced his pain  Results for orders placed or performed in visit on 11/01/14  Comprehensive metabolic panel  Result Value Ref Range   Sodium 136 135 - 145 mEq/L   Potassium 4.4 3.5 - 5.3 mEq/L   Chloride 98 96 - 112 mEq/L   CO2 26 19 - 32 mEq/L   Glucose, Bld 185 (H) 70 - 99 mg/dL   BUN 26 (H) 6 - 23 mg/dL   Creat 1.611.28 0.960.50 - 0.451.35 mg/dL   Total Bilirubin 0.4 0.2 - 1.2 mg/dL   Alkaline Phosphatase 52 39 - 117 U/L   AST 18 0 - 37 U/L   ALT 16 0 - 53 U/L   Total Protein 6.1 6.0 - 8.3 g/dL   Albumin 3.7 3.5 - 5.2 g/dL   Calcium  9.2 8.4 - 40.910.5 mg/dL  Amylase  Result Value Ref Range   Amylase 37 0 - 105 U/L  Lipase  Result Value Ref Range   Lipase <10 0 - 75 U/L  POCT CBC  Result Value Ref Range   WBC 13.8 (A) 4.6 - 10.2 K/uL   Lymph, poc 1.8 0.6 - 3.4   POC LYMPH PERCENT 13.1 10 - 50 %L   MID (cbc) 0.7 0 - 0.9   POC MID % 5.4 0 - 12 %M   POC Granulocyte 11.2 (A) 2 - 6.9   Granulocyte percent 81.5 (A) 37 - 80 %G   RBC 4.88 4.69 - 6.13 M/uL   Hemoglobin 14.5 14.1 - 18.1 g/dL   HCT, POC 81.145.3 91.443.5 - 53.7 %   MCV 92.9 80 - 97 fL   MCH, POC 29.7 27 - 31.2 pg   MCHC 32.0 31.8 - 35.4 g/dL   RDW, POC 78.213.9 %   Platelet Count, POC 275 142 - 424 K/uL   MPV 7.1 0 - 99.8 fL  POCT glycosylated hemoglobin (Hb A1C)  Result Value Ref Range   Hemoglobin A1C 6.3     Assessment and Plan: Abdominal pain, epigastric - Plan: POCT CBC, gi cocktail (Maalox,Lidocaine,Donnatal), Comprehensive metabolic panel, Amylase, Lipase, sucralfate (CARAFATE) 1 G tablet, CANCELED: Comprehensive metabolic panel, CANCELED: Amylase, CANCELED: Lipase  Dyslipidemia - Plan: POCT glycosylated hemoglobin (Hb A1C)  Prediabetes  Epigastric pain/ LUQ pain.  Labs are reassuring that this is not pancreatitis.  He did improve with GI cocktail although sx not completley gone.  Called him with stat labs, plan to try carafate for a few days.  I will call and check on him in the morning.  A1c still in pre-diabetic range  Signed Abbe AmsterdamJessica Olive Zmuda, MD

## 2014-11-02 ENCOUNTER — Telehealth: Payer: Self-pay | Admitting: Family Medicine

## 2014-11-02 NOTE — Telephone Encounter (Signed)
Called him to check on him.  He states that he is about the same. Advised that he may need further imaging or testing and he should come in today or go to the ER if he is worse.  He states he is not worse, would prefer to wait and see how he is tomorrow.  He agrees to come into clinic tomorrow for a recheck if not improved

## 2014-11-03 ENCOUNTER — Ambulatory Visit (HOSPITAL_COMMUNITY)
Admission: RE | Admit: 2014-11-03 | Discharge: 2014-11-03 | Disposition: A | Discharge status: Home / Self Care | Payer: Commercial Managed Care - HMO | Source: Ambulatory Visit | Attending: Family Medicine | Admitting: Family Medicine

## 2014-11-03 ENCOUNTER — Ambulatory Visit (INDEPENDENT_AMBULATORY_CARE_PROVIDER_SITE_OTHER): Payer: Medicare HMO | Admitting: Family Medicine

## 2014-11-03 ENCOUNTER — Inpatient Hospital Stay (HOSPITAL_COMMUNITY)
Admission: EM | Admit: 2014-11-03 | Discharge: 2014-11-06 | DRG: 392 | Disposition: A | Discharge status: Home / Self Care | Payer: Commercial Managed Care - HMO | Attending: Internal Medicine | Admitting: Internal Medicine

## 2014-11-03 ENCOUNTER — Encounter (HOSPITAL_COMMUNITY): Payer: Self-pay

## 2014-11-03 ENCOUNTER — Ambulatory Visit (INDEPENDENT_AMBULATORY_CARE_PROVIDER_SITE_OTHER): Payer: Commercial Managed Care - HMO

## 2014-11-03 ENCOUNTER — Encounter (HOSPITAL_COMMUNITY): Payer: Self-pay | Admitting: Emergency Medicine

## 2014-11-03 VITALS — BP 106/64 | HR 66 | Temp 98.1°F | Resp 16 | Ht 65.0 in | Wt 124.0 lb

## 2014-11-03 DIAGNOSIS — K389 Disease of appendix, unspecified: Secondary | ICD-10-CM | POA: Diagnosis present

## 2014-11-03 DIAGNOSIS — R1010 Upper abdominal pain, unspecified: Secondary | ICD-10-CM

## 2014-11-03 DIAGNOSIS — R110 Nausea: Secondary | ICD-10-CM

## 2014-11-03 DIAGNOSIS — I10 Essential (primary) hypertension: Secondary | ICD-10-CM | POA: Diagnosis present

## 2014-11-03 DIAGNOSIS — K529 Noninfective gastroenteritis and colitis, unspecified: Principal | ICD-10-CM | POA: Diagnosis present

## 2014-11-03 DIAGNOSIS — K5000 Crohn's disease of small intestine without complications: Secondary | ICD-10-CM

## 2014-11-03 DIAGNOSIS — E785 Hyperlipidemia, unspecified: Secondary | ICD-10-CM | POA: Diagnosis present

## 2014-11-03 DIAGNOSIS — R1013 Epigastric pain: Secondary | ICD-10-CM | POA: Insufficient documentation

## 2014-11-03 DIAGNOSIS — Z79899 Other long term (current) drug therapy: Secondary | ICD-10-CM

## 2014-11-03 DIAGNOSIS — R109 Unspecified abdominal pain: Secondary | ICD-10-CM | POA: Diagnosis present

## 2014-11-03 DIAGNOSIS — E1159 Type 2 diabetes mellitus with other circulatory complications: Secondary | ICD-10-CM | POA: Diagnosis present

## 2014-11-03 DIAGNOSIS — I152 Hypertension secondary to endocrine disorders: Secondary | ICD-10-CM | POA: Diagnosis present

## 2014-11-03 DIAGNOSIS — E876 Hypokalemia: Secondary | ICD-10-CM | POA: Diagnosis present

## 2014-11-03 DIAGNOSIS — K3580 Unspecified acute appendicitis: Secondary | ICD-10-CM | POA: Diagnosis not present

## 2014-11-03 HISTORY — DX: Crohn's disease of small intestine without complications: K50.00

## 2014-11-03 LAB — CBC WITH DIFFERENTIAL/PLATELET
BASOS PCT: 0 % (ref 0–1)
Basophils Absolute: 0 10*3/uL (ref 0.0–0.1)
Eosinophils Absolute: 0.1 10*3/uL (ref 0.0–0.7)
Eosinophils Relative: 1 % (ref 0–5)
HCT: 41.5 % (ref 39.0–52.0)
Hemoglobin: 13.8 g/dL (ref 13.0–17.0)
Lymphocytes Relative: 23 % (ref 12–46)
Lymphs Abs: 2.8 10*3/uL (ref 0.7–4.0)
MCH: 30.7 pg (ref 26.0–34.0)
MCHC: 33.3 g/dL (ref 30.0–36.0)
MCV: 92.2 fL (ref 78.0–100.0)
MONO ABS: 0.6 10*3/uL (ref 0.1–1.0)
Monocytes Relative: 5 % (ref 3–12)
NEUTROS PCT: 71 % (ref 43–77)
Neutro Abs: 8.6 10*3/uL — ABNORMAL HIGH (ref 1.7–7.7)
PLATELETS: 274 10*3/uL (ref 150–400)
RBC: 4.5 MIL/uL (ref 4.22–5.81)
RDW: 13.6 % (ref 11.5–15.5)
WBC: 12.1 10*3/uL — ABNORMAL HIGH (ref 4.0–10.5)

## 2014-11-03 LAB — URINALYSIS, ROUTINE W REFLEX MICROSCOPIC
Bilirubin Urine: NEGATIVE
Glucose, UA: 250 mg/dL — AB
KETONES UR: NEGATIVE mg/dL
Leukocytes, UA: NEGATIVE
Nitrite: NEGATIVE
Protein, ur: NEGATIVE mg/dL
Specific Gravity, Urine: 1.039 — ABNORMAL HIGH (ref 1.005–1.030)
UROBILINOGEN UA: 0.2 mg/dL (ref 0.0–1.0)
pH: 5 (ref 5.0–8.0)

## 2014-11-03 LAB — COMPREHENSIVE METABOLIC PANEL
ALT: 16 U/L (ref 0–53)
AST: 20 U/L (ref 0–37)
Albumin: 3.6 g/dL (ref 3.5–5.2)
Alkaline Phosphatase: 65 U/L (ref 39–117)
Anion gap: 8 (ref 5–15)
BUN: 24 mg/dL — ABNORMAL HIGH (ref 6–23)
CALCIUM: 8.9 mg/dL (ref 8.4–10.5)
CHLORIDE: 98 mmol/L (ref 96–112)
CO2: 31 mmol/L (ref 19–32)
Creatinine, Ser: 1.17 mg/dL (ref 0.50–1.35)
GFR calc non Af Amer: 60 mL/min — ABNORMAL LOW (ref >90)
GFR, EST AFRICAN AMERICAN: 70 mL/min — AB (ref >90)
GLUCOSE: 130 mg/dL — AB (ref 70–99)
Potassium: 3.7 mmol/L (ref 3.5–5.1)
SODIUM: 137 mmol/L (ref 135–145)
TOTAL PROTEIN: 6.8 g/dL (ref 6.0–8.3)
Total Bilirubin: 0.3 mg/dL (ref 0.3–1.2)

## 2014-11-03 LAB — POCT CBC
Granulocyte percent: 80.8 %G — AB (ref 37–80)
HCT, POC: 45.6 % (ref 43.5–53.7)
Hemoglobin: 14.6 g/dL (ref 14.1–18.1)
Lymph, poc: 1.9 (ref 0.6–3.4)
MCH, POC: 30.2 pg (ref 27–31.2)
MCHC: 32.1 g/dL (ref 31.8–35.4)
MCV: 93.9 fL (ref 80–97)
MID (cbc): 0.9 (ref 0–0.9)
MPV: 7 fL (ref 0–99.8)
POC Granulocyte: 11.8 — AB (ref 2–6.9)
POC LYMPH PERCENT: 13 %L (ref 10–50)
POC MID %: 6.2 %M (ref 0–12)
Platelet Count, POC: 273 10*3/uL (ref 142–424)
RBC: 4.85 M/uL (ref 4.69–6.13)
RDW, POC: 14.7 %
WBC: 14.6 10*3/uL — AB (ref 4.6–10.2)

## 2014-11-03 LAB — URINE MICROSCOPIC-ADD ON

## 2014-11-03 LAB — LIPASE, BLOOD: LIPASE: 20 U/L (ref 11–59)

## 2014-11-03 MED ORDER — HYDROCODONE-ACETAMINOPHEN 5-325 MG PO TABS: 1.0000 | ORAL_TABLET | ORAL | Status: DC | PRN

## 2014-11-03 MED ORDER — CIPROFLOXACIN IN D5W 400 MG/200ML IV SOLN
400.0000 mg | Freq: Once | INTRAVENOUS | Status: AC
  Administered 2014-11-03: 400 mg via INTRAVENOUS
  Filled 2014-11-03: qty 200

## 2014-11-03 MED ORDER — MORPHINE SULFATE 2 MG/ML IJ SOLN: 2.0000 mg | INTRAMUSCULAR | Status: DC | PRN

## 2014-11-03 MED ORDER — ONDANSETRON HCL 4 MG/2ML IJ SOLN: 4.0000 mg | Freq: Four times a day (QID) | INTRAMUSCULAR | Status: DC | PRN

## 2014-11-03 MED ORDER — SODIUM CHLORIDE 0.9 % IV SOLN
INTRAVENOUS | Status: DC
Start: 2014-11-03 — End: 2014-11-05

## 2014-11-03 MED ORDER — METRONIDAZOLE IN NACL 5-0.79 MG/ML-% IV SOLN
500.0000 mg | Freq: Three times a day (TID) | INTRAVENOUS | Status: DC
  Administered 2014-11-04 – 2014-11-06 (×7): 500 mg via INTRAVENOUS
  Filled 2014-11-03 (×9): qty 100

## 2014-11-03 MED ORDER — ATENOLOL 50 MG PO TABS
50.0000 mg | ORAL_TABLET | Freq: Every day | ORAL | Status: DC
  Administered 2014-11-04 – 2014-11-05 (×2): 50 mg via ORAL
  Filled 2014-11-03 (×3): qty 1

## 2014-11-03 MED ORDER — ACETAMINOPHEN 325 MG PO TABS: 650.0000 mg | ORAL_TABLET | Freq: Four times a day (QID) | ORAL | Status: DC | PRN

## 2014-11-03 MED ORDER — ENOXAPARIN SODIUM 40 MG/0.4ML ~~LOC~~ SOLN
40.0000 mg | Freq: Every day | SUBCUTANEOUS | Status: DC
  Administered 2014-11-04 – 2014-11-05 (×3): 40 mg via SUBCUTANEOUS
  Filled 2014-11-03 (×4): qty 0.4

## 2014-11-03 MED ORDER — ACETAMINOPHEN 650 MG RE SUPP: 650.0000 mg | Freq: Four times a day (QID) | RECTAL | Status: DC | PRN

## 2014-11-03 MED ORDER — CIPROFLOXACIN IN D5W 400 MG/200ML IV SOLN
400.0000 mg | Freq: Two times a day (BID) | INTRAVENOUS | Status: DC
  Administered 2014-11-04 – 2014-11-05 (×4): 400 mg via INTRAVENOUS
  Filled 2014-11-03 (×5): qty 200

## 2014-11-03 MED ORDER — IOHEXOL 300 MG/ML  SOLN
100.0000 mL | Freq: Once | INTRAMUSCULAR | Status: AC | PRN
  Administered 2014-11-03: 80 mL via INTRAVENOUS

## 2014-11-03 MED ORDER — ONDANSETRON HCL 4 MG PO TABS: 4.0000 mg | ORAL_TABLET | Freq: Four times a day (QID) | ORAL | Status: DC | PRN

## 2014-11-03 MED ORDER — IOHEXOL 300 MG/ML  SOLN
50.0000 mL | Freq: Once | INTRAMUSCULAR | Status: AC | PRN
Start: 2014-11-03 — End: 2014-11-03
  Administered 2014-11-03: 50 mL via ORAL

## 2014-11-03 MED ORDER — METRONIDAZOLE IN NACL 5-0.79 MG/ML-% IV SOLN: 500.0000 mg | Freq: Once | INTRAVENOUS | Status: DC

## 2014-11-03 NOTE — ED Notes (Signed)
Pt c/o abdominal pain, sent in after CT showed abnormal wall thickening of ileum and abnormal appendix.  Denies nausea, vomiting, diarrhea.

## 2014-11-03 NOTE — ED Notes (Signed)
Dr Gross at bedside

## 2014-11-03 NOTE — ED Notes (Signed)
Dr. Wofford at bedside 

## 2014-11-03 NOTE — ED Notes (Signed)
Report given to floor All questions answered by this nurse Room is in the process of being cleaned by housekeeping Will transport patient up to floor shortly

## 2014-11-03 NOTE — ED Provider Notes (Signed)
CSN: [id number]     Arrival date & time 11/03/14  1730 History   First MD Initiated Contact with Patient 11/03/14 2041     Chief Complaint  Patient presents with  . Abdominal Pain     (Consider location/radiation/quality/duration/timing/severity/associated sxs/prior Treatment) Patient is a 74 y.o. male presenting with abdominal pain.  Abdominal Pain Pain location: lower abdominal. Pain quality: sharp   Pain radiates to:  Does not radiate Pain severity:  Moderate Onset quality:  Gradual Duration:  5 days Timing:  Constant Progression:  Unchanged Chronicity:  New Context comment:  Saw doctor earlier in the week, given sucralfate.  Pain then moved from epigastrium to lower abdominal about two days ago. Relieved by:  Nothing Exacerbated by: not worse with food. Associated symptoms: no constipation, no diarrhea, no dysuria, no fever, no nausea and no vomiting     Past Medical History  Diagnosis Date  . Hypertension    History reviewed. No pertinent past surgical history. Family History  Problem Relation Age of Onset  . Throat cancer Brother   . Asthma Son    History  Substance Use Topics  . Smoking status: Never Smoker   . Smokeless tobacco: Never Used  . Alcohol Use: No    Review of Systems  Constitutional: Negative for fever.  Gastrointestinal: Positive for abdominal pain. Negative for nausea, vomiting, diarrhea and constipation.  Genitourinary: Negative for dysuria.  All other systems reviewed and are negative.     Allergies  Lisinopril  Home Medications   Prior to Admission medications   Medication Sig Start Date End Date Taking? Authorizing Provider  atenolol-chlorthalidone (TENORETIC) 50-25 MG per tablet Take 1 tablet by mouth daily. 12/20/13  Yes Gwenlyn Found Copland, MD  sucralfate (CARAFATE) 1 G tablet Take 1 tablet (1 g total) by mouth 4 (four) times daily -  with meals and at bedtime. 11/01/14  Yes Gwenlyn Found Copland, MD  benzonatate (TESSALON) 100 MG  capsule Take 1 capsule (100 mg total) by mouth 3 (three) times daily as needed for cough. Patient not taking: Reported on 11/03/2014 01/09/14   Pearline Cables, MD  pravastatin (PRAVACHOL) 20 MG tablet Take 1 tablet (20 mg total) by mouth daily. Patient not taking: Reported on 11/03/2014 07/03/14   Gwenlyn Found Copland, MD   BP 145/83 mmHg  Pulse 74  Temp(Src) 98 F (36.7 C) (Oral)  Resp 20  SpO2 99% Physical Exam  Constitutional: He is oriented to person, place, and time. He appears well-developed and well-nourished. No distress.  HENT:  Head: Normocephalic and atraumatic.  Mouth/Throat: Oropharynx is clear and moist.  Eyes: Conjunctivae are normal. Pupils are equal, round, and reactive to light. No scleral icterus.  Neck: Neck supple.  Cardiovascular: Normal rate, regular rhythm, normal heart sounds and intact distal pulses.   No murmur heard. Pulmonary/Chest: Effort normal and breath sounds normal. No stridor. No respiratory distress. He has no wheezes. He has no rales.  Abdominal: Soft. He exhibits no distension. There is tenderness in the right lower quadrant, suprapubic area and left lower quadrant. There is no rigidity, no rebound and no guarding.  Musculoskeletal: Normal range of motion. He exhibits no edema.  Neurological: He is alert and oriented to person, place, and time.  Skin: Skin is warm and dry. No rash noted.  Psychiatric: He has a normal mood and affect. His behavior is normal.  Nursing note and vitals reviewed.   ED Course  Procedures (including critical care time) Labs Review Labs Reviewed  CBC WITH DIFFERENTIAL/PLATELET - Abnormal; Notable for the following:    WBC 12.1 (*)    Neutro Abs 8.6 (*)    All other components within normal limits  URINALYSIS, ROUTINE W REFLEX MICROSCOPIC - Abnormal; Notable for the following:    Specific Gravity, Urine 1.039 (*)    Glucose, UA 250 (*)    Hgb urine dipstick SMALL (*)    All other components within normal limits   URINE MICROSCOPIC-ADD ON  COMPREHENSIVE METABOLIC PANEL  LIPASE, BLOOD    Imaging Review Dg Abd 1 View  11/03/2014   CLINICAL DATA:  Followup abdominal pain. Does not feel better. Stool is dark brown.  EXAM: ABDOMEN - 1 VIEW  COMPARISON:  None.  FINDINGS: Bowel gas pattern is nonobstructive. Surgical clips are identified within the pelvis. Visualized osseous structures have a normal appearance. No organomegaly.  IMPRESSION: Negative.   Electronically Signed   By: Norva Pavlov M.D.   On: 11/03/2014 13:47   Ct Abdomen Pelvis W Contrast  11/03/2014   CLINICAL DATA:  Epigastric abdominal pain, nausea x4 days, history of hernia repair  EXAM: CT ABDOMEN AND PELVIS WITH CONTRAST  TECHNIQUE: Multidetector CT imaging of the abdomen and pelvis was performed using the standard protocol following bolus administration of intravenous contrast.  CONTRAST:  80 mL Omnipaque 300 IV  COMPARISON:  None.  FINDINGS: Lower chest:  Lung bases are clear.  Hepatobiliary:  Liver is within normal limits.  Gallbladder is unremarkable. No intrahepatic or extrahepatic ductal dilatation.  Pancreas: Within normal limits.  Spleen: Within normal limits.  Adrenals/Urinary Tract: Adrenal glands are unremarkable.  5 mm cyst in the medial left upper kidney (series 5/image 8). Right kidney is within normal limits. No hydronephrosis.  Bladder is mildly trabeculated but otherwise within normal limits.  Stomach/Bowel: Stomach is within normal limits.  No evidence of bowel obstruction.  Abnormal wall thickening involving a loop of ileum in the right mid abdomen (series 2/ images 50-59). Differential considerations include infectious/inflammatory enteritis or small bowel lymphoma.  Appendix is abnormally thickened, measuring up to 8 mm (series 2/image 55), with a 9 mm fluid density lesion at its tip (series 2/image 49). This appearance may reflect acute appendicitis, but is more suggestive of chronic inflammation. An underlying appendiceal  lesion such as mucinous neoplasm, appendiceal mucocele, or less likely appendiceal carcinoid is also possible.  Vascular/Lymphatic: Ectasia of the infrarenal abdominal aorta, measuring 2.6 x 2.7 cm just above the iliac bifurcation (series 2/image 45).  Atherosclerotic calcifications of the abdominal aorta and branch vessels.  No suspicious abdominopelvic lymphadenopathy.  Reproductive: Prostatomegaly.  Other: No abdominopelvic ascites.  Postsurgical changes related to bilateral inguinal hernia repair.  Musculoskeletal: Degenerative changes of the visualized thoracolumbar spine.  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  Abnormal appendix, favored to reflect chronic inflammation, less likely acute appendicitis. Underlying appendiceal lesion/neoplasm is not excluded given the presence of a low-density lesion at the tip of the appendix.  These results will be called to the ordering clinician or representative by the Radiology Department at the imaging location.   Electronically Signed   By: Charline Bills M.D.   On: 11/03/2014 17:10     EKG Interpretation None      MDM   Final diagnoses:  Abdominal pain, unspecified abdominal location  Segmental ileitis, without complications    74 yo male who was referred  to the ED after his PCP obtained a CT scan which showed inflammation of ileus and appendix.  Pt generally well appearing.  I discussed his case with Dr. Michaell CowingGross (general surgery), who will evaluate patient.  Dr. Allena KatzPatel will admit.    Merrie RoofJohn David Dierre Crevier III, MD 11/04/14 [phone]

## 2014-11-03 NOTE — Consult Note (Signed)
[facility]  [address]  [place], [place] [address] Phone: [phone] FAX: [phone]     Erik Aguirre  1941/03/03 [id number]  CARE TEAM:  PCP: Dow Adolph, PA  Outpatient Care Team: Patient Care Team: Dow Adolph, [place] as PCP - General (Physician Assistant)  Inpatient Treatment Team: Treatment Team: Attending Provider: Merrie Roof, MD; Technician: Justice Britain, NT; Registered Nurse: Binnie Rail, RN; Registered Nurse: Vanna Scotland, RN; Consulting Physician: Bishop Limbo, MD  This patient is a 74 y.o.male who presents today for surgical evaluation at the request of Dr Loretha Stapler, [facility] ED.   Reason for evaluation: Abd pain.  ?Ileitis?  Appendicitis?  Pleasant Asian gentleman originally from [place].  No prior abdominal surgeries.  History of left inguinal hernia repairs 2.  Has had some abdominal pain for the past 5 days.  Initially periumbilical but now more in the lower abdomen.  Went to urgent care Center.  Concern perhaps for heartburn.  Started on Carafate.  Did not improve.  Persistent problem.  CAT scan done.  Concern for thickened loop of ileum and appendix may be abnormal.  They recommended going to the emergency room.  Wife and room.  Nurse in room.  Patient denies any nausea or vomiting.  No history of Crohn's or ulcerative colitis.  No sick contacts or travel history.  His never had a colonoscopy.  Denies much heartburn or reflux.  No severe pain with eating usually.  Past Medical History  Diagnosis Date  . Hypertension     History reviewed. No pertinent past surgical history.  History   Social History  . Marital Status: Married    Spouse Name: N/A  . Number of Children: N/A  . Years of Education: N/A   Occupational History  . Retired    Social History Main Topics  . Smoking status: Never Smoker   . Smokeless tobacco: Never Used  . Alcohol Use: No  . Drug Use: No  .  Sexual Activity: Not on file   Other Topics Concern  . Not on file   Social History Narrative    Family History  Problem Relation Age of Onset  . Throat cancer Brother   . Asthma Son     Current Facility-Administered Medications  Medication Dose Route Frequency Provider Last Rate Last Dose  . ciprofloxacin (CIPRO) IVPB 400 mg  400 mg Intravenous Once Candyce Churn III, MD 200 mL/hr at 11/03/14 2211 400 mg at 11/03/14 2211  . metroNIDAZOLE (FLAGYL) IVPB 500 mg  500 mg Intravenous Once Candyce Churn III, MD       Current Outpatient Prescriptions  Medication Sig Dispense Refill  . atenolol-chlorthalidone (TENORETIC) 50-25 MG per tablet Take 1 tablet by mouth daily. 90 tablet 3  . sucralfate (CARAFATE) 1 G tablet Take 1 tablet (1 g total) by mouth 4 (four) times daily -  with meals and at bedtime. 40 tablet 0  . benzonatate (TESSALON) 100 MG capsule Take 1 capsule (100 mg total) by mouth 3 (three) times daily as needed for cough. (Patient not taking: Reported on 11/03/2014) 40 capsule 0  . pravastatin (PRAVACHOL) 20 MG tablet Take 1 tablet (20 mg total) by mouth daily. (Patient not taking: Reported on 11/03/2014) 90 tablet 3     Allergies  Allergen Reactions  . Lisinopril Cough    ROS: Constitutional:  No fevers, chills, sweats.  Weight stable Eyes:  No vision changes, No  discharge HENT:  No sore throats, nasal drainage Lymph: No neck swelling, No bruising easily Pulmonary:  No cough, productive sputum CV: No orthopnea, PND  Patient walks 30 minutes without difficulty.  No exertional chest/neck/shoulder/arm pain. GI: No personal nor family history of GI/colon cancer, inflammatory bowel disease, irritable bowel syndrome, allergy such as Celiac Sprue, dietary/dairy problems, colitis, ulcers nor gastritis.  No recent sick contacts/gastroenteritis.  No travel outside the country.  No changes in diet. Renal: No UTIs, No hematuria Genital:  No drainage, bleeding,  masses Musculoskeletal: No severe joint pain.  Good ROM major joints Skin:  No sores or lesions.  No rashes Heme/Lymph:  No easy bleeding.  No swollen lymph nodes Neuro: No focal weakness/numbness.  No seizures Psych: No suicidal ideation.  No hallucinations  BP 155/86 mmHg  Pulse 68  Temp(Src) 98 F (36.7 C) (Oral)  Resp 20  SpO2 97%  Physical Exam: General: Pt awake/alert/oriented x4 in no major acute distress Eyes: PERRL, normal EOM. Sclera nonicteric Neuro: CN II-XII intact w/o focal sensory/motor deficits. Lymph: No head/neck/groin lymphadenopathy Psych:  No delerium/psychosis/paranoia HENT: Normocephalic, Mucus membranes moist.  No thrush Neck: Supple, No tracheal deviation Chest: No pain.  Good respiratory excursion. CV:  Pulses intact.  Regular rhythm Abdomen: Soft, Nondistended.  Min tender lower abdomen.  No incarcerated hernias.  No umbilical hernia.  No diastases recti. GU: Normal external male genitalia.  Scar in left groin.  No recurrent internal hernias. Ext:  SCDs BLE.  No significant edema.  No cyanosis Skin: No petechiae / purpurea.  No major sores Musculoskeletal: No severe joint pain.  Good ROM major joints   Results:   Labs: Results for orders placed or performed during the hospital encounter of 11/03/14 (from the past 48 hour(s))  Urinalysis, Routine w reflex microscopic     Status: Abnormal   Collection Time: 11/03/14  8:43 PM  Result Value Ref Range   Color, Urine YELLOW YELLOW   APPearance CLEAR CLEAR   Specific Gravity, Urine 1.039 (H) 1.005 - 1.030   pH 5.0 5.0 - 8.0   Glucose, UA 250 (A) NEGATIVE mg/dL   Hgb urine dipstick SMALL (A) NEGATIVE   Bilirubin Urine NEGATIVE NEGATIVE   Ketones, ur NEGATIVE NEGATIVE mg/dL   Protein, ur NEGATIVE NEGATIVE mg/dL   Urobilinogen, UA 0.2 0.0 - 1.0 mg/dL   Nitrite NEGATIVE NEGATIVE   Leukocytes, UA NEGATIVE NEGATIVE  Urine microscopic-add on     Status: None   Collection Time: 11/03/14  8:43 PM  Result  Value Ref Range   WBC, UA 0-2 <3 WBC/hpf   Bacteria, UA RARE RARE  CBC with Differential     Status: Abnormal   Collection Time: 11/03/14  8:50 PM  Result Value Ref Range   WBC 12.1 (H) 4.0 - 10.5 K/uL   RBC 4.50 4.22 - 5.81 MIL/uL   Hemoglobin 13.8 13.0 - 17.0 g/dL   HCT 41.5 39.0 - 52.0 %   MCV 92.2 78.0 - 100.0 fL   MCH 30.7 26.0 - 34.0 pg   MCHC 33.3 30.0 - 36.0 g/dL   RDW 13.6 11.5 - 15.5 %   Platelets 274 150 - 400 K/uL   Neutrophils Relative % 71 43 - 77 %   Neutro Abs 8.6 (H) 1.7 - 7.7 K/uL   Lymphocytes Relative 23 12 - 46 %   Lymphs Abs 2.8 0.7 - 4.0 K/uL   Monocytes Relative 5 3 - 12 %   Monocytes Absolute 0.6 0.1 - 1.0  K/uL   Eosinophils Relative 1 0 - 5 %   Eosinophils Absolute 0.1 0.0 - 0.7 K/uL   Basophils Relative 0 0 - 1 %   Basophils Absolute 0.0 0.0 - 0.1 K/uL  Comprehensive metabolic panel     Status: Abnormal   Collection Time: 11/03/14  8:50 PM  Result Value Ref Range   Sodium 137 135 - 145 mmol/L   Potassium 3.7 3.5 - 5.1 mmol/L   Chloride 98 96 - 112 mmol/L   CO2 31 19 - 32 mmol/L   Glucose, Bld 130 (H) 70 - 99 mg/dL   BUN 24 (H) 6 - 23 mg/dL   Creatinine, Ser 1.17 0.50 - 1.35 mg/dL   Calcium 8.9 8.4 - 10.5 mg/dL   Total Protein 6.8 6.0 - 8.3 g/dL   Albumin 3.6 3.5 - 5.2 g/dL   AST 20 0 - 37 U/L   ALT 16 0 - 53 U/L   Alkaline Phosphatase 65 39 - 117 U/L   Total Bilirubin 0.3 0.3 - 1.2 mg/dL   GFR calc non Af Amer 60 (L) >90 mL/min   GFR calc Af Amer 70 (L) >90 mL/min    Comment: (NOTE) The eGFR has been calculated using the CKD EPI equation. This calculation has not been validated in all clinical situations. eGFR's persistently <90 mL/min signify possible Chronic Kidney Disease.    Anion gap 8 5 - 15  Lipase, blood     Status: None   Collection Time: 11/03/14  8:50 PM  Result Value Ref Range   Lipase 20 11 - 59 U/L    Imaging / Studies: Dg Abd 1 View  11/03/2014   CLINICAL DATA:  Followup abdominal pain. Does not feel better. Stool is  dark brown.  EXAM: ABDOMEN - 1 VIEW  COMPARISON:  None.  FINDINGS: Bowel gas pattern is nonobstructive. Surgical clips are identified within the pelvis. Visualized osseous structures have a normal appearance. No organomegaly.  IMPRESSION: Negative.   Electronically Signed   By: Nolon Nations M.D.   On: 11/03/2014 13:47   Ct Abdomen Pelvis W Contrast  11/03/2014   CLINICAL DATA:  Epigastric abdominal pain, nausea x4 days, history of hernia repair  EXAM: CT ABDOMEN AND PELVIS WITH CONTRAST  TECHNIQUE: Multidetector CT imaging of the abdomen and pelvis was performed using the standard protocol following bolus administration of intravenous contrast.  CONTRAST:  80 mL Omnipaque 300 IV  COMPARISON:  None.  FINDINGS: Lower chest:  Lung bases are clear.  Hepatobiliary:  Liver is within normal limits.  Gallbladder is unremarkable. No intrahepatic or extrahepatic ductal dilatation.  Pancreas: Within normal limits.  Spleen: Within normal limits.  Adrenals/Urinary Tract: Adrenal glands are unremarkable.  5 mm cyst in the medial left upper kidney (series 5/image 8). Right kidney is within normal limits. No hydronephrosis.  Bladder is mildly trabeculated but otherwise within normal limits.  Stomach/Bowel: Stomach is within normal limits.  No evidence of bowel obstruction.  Abnormal wall thickening involving a loop of ileum in the right mid abdomen (series 2/ images 50-59). Differential considerations include infectious/inflammatory enteritis or small bowel lymphoma.  Appendix is abnormally thickened, measuring up to 8 mm (series 2/image 55), with a 9 mm fluid density lesion at its tip (series 2/image 49). This appearance may reflect acute appendicitis, but is more suggestive of chronic inflammation. An underlying appendiceal lesion such as mucinous neoplasm, appendiceal mucocele, or less likely appendiceal carcinoid is also possible.  Vascular/Lymphatic: Ectasia of the infrarenal abdominal aorta,  measuring 2.6 x 2.7 cm just  above the iliac bifurcation (series 2/image 45).  Atherosclerotic calcifications of the abdominal aorta and branch vessels.  No suspicious abdominopelvic lymphadenopathy.  Reproductive: Prostatomegaly.  Other: No abdominopelvic ascites.  Postsurgical changes related to bilateral inguinal hernia repair.  Musculoskeletal: Degenerative changes of the visualized thoracolumbar spine.  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  Abnormal appendix, favored to reflect chronic inflammation, less likely acute appendicitis. Underlying appendiceal lesion/neoplasm is not excluded given the presence of a low-density lesion at the tip of the appendix.  These results will be called to the ordering clinician or representative by the Radiology Department at the imaging location.   Electronically Signed   By: Julian Hy M.D.   On: 11/03/2014 17:10    Medications / Allergies: per chart  Antibiotics: Anti-infectives    Start     Dose/Rate Route Frequency Ordered Stop   11/03/14 2145  ciprofloxacin (CIPRO) IVPB 400 mg     400 mg 200 mL/hr over 60 Minutes Intravenous  Once 11/03/14 2143     11/03/14 2145  metroNIDAZOLE (FLAGYL) IVPB 500 mg     500 mg 100 mL/hr over 60 Minutes Intravenous  Once 11/03/14 2143        Assessment  Erik Aguirre  74 y.o. male       Problem List:  Active Problems:   Segmental ileitis   Abdominal pain   Thickened segment of ileum.  Not at terminal ileum.  Most likely inflammatory such is allergic.  Appendix with perhaps small mass at hip but no major stranding or perforation.  Uncertain etiology  Plan:  IV fluids.  IV antibiotics.  Serial exams  Medical and gastroenterology consultations.  See if gastroenterology thinks the patient may have Crohn's or inflammatory bowel disease.  See if this can resolve  nonoperatively.  Patient may benefit from elective appendectomy at some point if the ileitis resolves and that is the only issue left.  Does not seem consistent with appendicitis.  He does not have peritonitis.  Perhaps can be transition to outpatient care if meets basic discharge goals and has planned follow-up with gastroenterology.  May benefit from capsule endoscopy and colonoscopy to evaluate the area.  There is no peritonitis or shock.  I would not rush to operate on this gentleman at this point.  We will follow closely.  If he has obstructing symptoms and fails antibiotic and/or immunosuppressive management, may benefit from ileal cecal resection with appendectomy.  Would not rush to do that at this point  -VTE prophylaxis- SCDs, etc  -mobilize as tolerated to help recovery    Adin Hector, M.D., F.A.C.S. Gastrointestinal and Minimally Invasive Surgery [facility] [address] [place], [place] [address] [phone] Main / Paging   11/03/2014  Note: Portions of this report may have been transcribed using voice recognition software. Every effort was made to ensure accuracy; however, inadvertent computerized transcription errors may be present.   Any transcriptional errors that result from this process are unintentional.

## 2014-11-03 NOTE — ED Notes (Signed)
Patient sent to ED after abnormal CT scan from earlier today Patient with c/o ongoing abdominal pain which he currently rates 2/10 Patient denies N/V/D Patient alert and oriented x 4 Patient ambulatory from triage without difficulty VS updated and stable Patient denies further needs at this time

## 2014-11-03 NOTE — H&P (Signed)
[facility] History and Physical  Patient: Erik Aguirre  MRN: [id number]  DOB: 1940-10-31  DOS: the patient was seen and examined on 11/03/2014 PCP: Dow Adolph, PA  Chief Complaint: Abdominal pain  HPI: [name] is a 74 y.o. male with Past medical history of hypertension. Patient presents with complaints of abdominal pain presentation since last one to 2 weeks. The pain is located in his umbilical area. The pain is really Endocrine. Patient denies any nausea vomiting diarrhea or constipation or active bleeding. Denies any fever but has some chills. Denies any chest pain shortness of breath cough. Denies any travel outside of status. Denies any recent change in his diet. Denies any recent change in his medication. With this complaint the patient was seen in the urgent care and had an CT scan ordered. A CT scan was showing entritis that the patient was sent here for further workup.  The patient is coming from home. And at his baseline independent for most of his ADL.  Review of Systems: as mentioned in the history of present illness.  A Comprehensive review of the other systems is negative.  Past Medical History  Diagnosis Date  . Hypertension    History reviewed. No pertinent past surgical history. Social History:  reports that he has never smoked. He has never used smokeless tobacco. He reports that he does not drink alcohol or use illicit drugs.  Allergies  Allergen Reactions  . Lisinopril Cough    Family History  Problem Relation Age of Onset  . Throat cancer Brother   . Asthma Son     Prior to Admission medications   Medication Sig Start Date End Date Taking? Authorizing Provider  atenolol-chlorthalidone (TENORETIC) 50-25 MG per tablet Take 1 tablet by mouth daily. 12/20/13  Yes Gwenlyn Found Copland, MD  sucralfate (CARAFATE) 1 G tablet Take 1 tablet (1 g total) by mouth 4 (four) times daily -  with meals and at bedtime. 11/01/14  Yes Gwenlyn Found Copland,  MD  benzonatate (TESSALON) 100 MG capsule Take 1 capsule (100 mg total) by mouth 3 (three) times daily as needed for cough. Patient not taking: Reported on 11/03/2014 01/09/14   Pearline Cables, MD  pravastatin (PRAVACHOL) 20 MG tablet Take 1 tablet (20 mg total) by mouth daily. Patient not taking: Reported on 11/03/2014 07/03/14   Pearline Cables, MD    Physical Exam: Filed Vitals:   11/03/14 2100 11/03/14 2202 11/03/14 2204 11/03/14 2251  BP: 127/79 155/86    Pulse: 64  68 61  Temp:      TempSrc:      Resp:      SpO2: 98%  97% 97%    General: Alert, Awake and Oriented to Time, Place and Person. Appear in mild distress Eyes: PERRL ENT: Oral Mucosa clear moist. Neck: no JVD Cardiovascular: S1 and S2 Present, no Murmur, Peripheral Pulses Present Respiratory: Bilateral Air entry equal and Decreased, Clear to Auscultation, noCrackles, no wheezes Abdomen: Bowel Sound present, Soft and lower abdominal tender Skin: no Rash Extremities: no Pedal edema, no calf tenderness Neurologic: Grossly no focal neuro deficit.  Labs on Admission:  CBC:  Recent Labs Lab 11/01/14 1510 11/03/14 1242 11/03/14 2050  WBC 13.8* 14.6* 12.1*  NEUTROABS  --   --  8.6*  HGB 14.5 14.6 13.8  HCT 45.3 45.6 41.5  MCV 92.9 93.9 92.2  PLT  --   --  274    CMP     Component Value Date/Time  NA 137 11/03/2014 2050   K 3.7 11/03/2014 2050   CL 98 11/03/2014 2050   CO2 31 11/03/2014 2050   GLUCOSE 130* 11/03/2014 2050   BUN 24* 11/03/2014 2050   CREATININE 1.17 11/03/2014 2050   CREATININE 1.28 11/01/2014 1533   CALCIUM 8.9 11/03/2014 2050   PROT 6.8 11/03/2014 2050   ALBUMIN 3.6 11/03/2014 2050   AST 20 11/03/2014 2050   ALT 16 11/03/2014 2050   ALKPHOS 65 11/03/2014 2050   BILITOT 0.3 11/03/2014 2050   GFRNONAA 60* 11/03/2014 2050   GFRAA 70* 11/03/2014 2050     Recent Labs Lab 11/01/14 1533 11/03/14 2050  LIPASE <10 20  AMYLASE 37  --     No results for input(s): CKTOTAL, CKMB,  CKMBINDEX, TROPONINI in the last 168 hours. BNP (last 3 results) No results for input(s): BNP in the last 8760 hours.  ProBNP (last 3 results) No results for input(s): PROBNP in the last 8760 hours.   Radiological Exams on Admission: Dg Abd 1 View  11/03/2014   CLINICAL DATA:  Followup abdominal pain. Does not feel better. Stool is dark brown.  EXAM: ABDOMEN - 1 VIEW  COMPARISON:  None.  FINDINGS: Bowel gas pattern is nonobstructive. Surgical clips are identified within the pelvis. Visualized osseous structures have a normal appearance. No organomegaly.  IMPRESSION: Negative.   Electronically Signed   By: Norva PavlovElizabeth  Brown M.D.   On: 11/03/2014 13:47   Ct Abdomen Pelvis W Contrast  11/03/2014   CLINICAL DATA:  Epigastric abdominal pain, nausea x4 days, history of hernia repair  EXAM: CT ABDOMEN AND PELVIS WITH CONTRAST  TECHNIQUE: Multidetector CT imaging of the abdomen and pelvis was performed using the standard protocol following bolus administration of intravenous contrast.  CONTRAST:  80 mL Omnipaque 300 IV  COMPARISON:  None.  FINDINGS: Lower chest:  Lung bases are clear.  Hepatobiliary:  Liver is within normal limits.  Gallbladder is unremarkable. No intrahepatic or extrahepatic ductal dilatation.  Pancreas: Within normal limits.  Spleen: Within normal limits.  Adrenals/Urinary Tract: Adrenal glands are unremarkable.  5 mm cyst in the medial left upper kidney (series 5/image 8). Right kidney is within normal limits. No hydronephrosis.  Bladder is mildly trabeculated but otherwise within normal limits.  Stomach/Bowel: Stomach is within normal limits.  No evidence of bowel obstruction.  Abnormal wall thickening involving a loop of ileum in the right mid abdomen (series 2/ images 50-59). Differential considerations include infectious/inflammatory enteritis or small bowel lymphoma.  Appendix is abnormally thickened, measuring up to 8 mm (series 2/image 55), with a 9 mm fluid density lesion at its tip  (series 2/image 49). This appearance may reflect acute appendicitis, but is more suggestive of chronic inflammation. An underlying appendiceal lesion such as mucinous neoplasm, appendiceal mucocele, or less likely appendiceal carcinoid is also possible.  Vascular/Lymphatic: Ectasia of the infrarenal abdominal aorta, measuring 2.6 x 2.7 cm just above the iliac bifurcation (series 2/image 45).  Atherosclerotic calcifications of the abdominal aorta and branch vessels.  No suspicious abdominopelvic lymphadenopathy.  Reproductive: Prostatomegaly.  Other: No abdominopelvic ascites.  Postsurgical changes related to bilateral inguinal hernia repair.  Musculoskeletal: Degenerative changes of the visualized thoracolumbar spine.  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  Abnormal appendix, favored to reflect chronic inflammation, less likely acute appendicitis. Underlying appendiceal lesion/neoplasm is not excluded given the presence of a  low-density lesion at the tip of the appendix.  These results will be called to the ordering clinician or representative by the Radiology Department at the imaging location.   Electronically Signed   By: Charline Bills M.D.   On: 11/03/2014 17:10    Assessment/Plan Principal Problem:   Enteritis Active Problems:   HTN (hypertension)   Dyslipidemia   Segmental ileitis   Abdominal pain   1. Enteritis The patient is presenting with complaints of abdominal pain. A CT scan is showing inflammation after his ileum as well as appendix. Possible differential is also brought in as a small bowel lymphoma. Gen. surgery is consulted and will be following up with the patient. We will follow the recommendation. Patient will be treated with Cipro and Flagyl and IV fluids and when necessary Zofran when necessary pain  medications. Patient will remain nothing by mouth except medications.  2. Hypertension. Continuing atenolol and holding of the diuretics.  Advance goals of care discussion: Full code   Consults: Gen. surgery  DVT Prophylaxis: subcutaneous Heparin. Nutrition: Nothing by mouth so medications  Family Communication: Wife was present at bedside, opportunity was given to ask question and all questions were answered satisfactorily at the time of interview. Disposition: Admitted to inpatient in med-surge unit.  Author: Lynden Oxford, MD [facility] Pager: [phone] 11/03/2014, 10:54 PM    If 7PM-7AM, please contact night-coverage [email or web address] Password [email or web address]

## 2014-11-03 NOTE — Progress Notes (Addendum)
° °  Subjective:    Patient ID: Erik Aguirre, male    DOB: 01/20/1941, 74 y.o.   MRN: [id number] This chart was scribed for Elvina SidleKurt Lauenstein, MD by Littie Deedsichard Sun, Medical Scribe. This patient was seen in Room 8 and the patient's care was started at 12:12 PM.   HPI HPI Comments: Erik Aguirre is a 74 y.o. male with a hx of HTN and pre-diabetes who presents to the [facility] for a follow-up for abdominal pain that started 4 days ago. He saw Dr. Patsy Lageropland for his pain 2 days ago; he was given medications and has been taking them as prescribed. Patient has not improved since then. He notes that his stool is a dark brown color. Patient denies fever, vomiting, and diarrhea.  Patient is retired.  Review of Systems  Constitutional: Negative for fever.  Gastrointestinal: Positive for abdominal pain. Negative for vomiting and diarrhea.       Objective:   Physical Exam CONSTITUTIONAL: Well developed/well nourished HEAD: Normocephalic/atraumatic EYES: EOM/PERRL ENMT: Mucous membranes moist NECK: supple no meningeal signs SPINE: entire spine nontender CV: S1/S2 noted, no murmurs/rubs/gallops noted LUNGS: Lungs are clear to auscultation bilaterally, no apparent distress ABDOMEN: soft, nontender with no HSM or mass GU: no cva tenderness NEURO: Pt is awake/alert, moves all extremitiesx4 EXTREMITIES: pulses normal, full ROM SKIN: warm, color normal PSYCH: no abnormalities of mood noted  [facility] reading (PRIMARY) by  Dr. Milus GlazierLauenstein: KUB nonspecific gas pattern  Results for orders placed or performed in visit on 11/03/14  POCT CBC  Result Value Ref Range   WBC 14.6 (A) 4.6 - 10.2 K/uL   Lymph, poc 1.9 0.6 - 3.4   POC LYMPH PERCENT 13.0 10 - 50 %L   MID (cbc) 0.9 0 - 0.9   POC MID % 6.2 0 - 12 %M   POC Granulocyte 11.8 (A) 2 - 6.9   Granulocyte percent 80.8 (A) 37 - 80 %G   RBC 4.85 4.69 - 6.13 M/uL   Hemoglobin 14.6 14.1 - 18.1 g/dL   HCT, POC 13.245.6 44.043.5 - 53.7 %   MCV 93.9 80 - 97 fL     MCH, POC 30.2 27 - 31.2 pg   MCHC 32.1 31.8 - 35.4 g/dL   RDW, POC 10.214.7 %   Platelet Count, POC 273 142 - 424 K/uL   MPV 7.0 0 - 99.8 fL        Assessment & Plan:   Given the increasing white count, CT scan is indicated  This chart was scribed in my presence and reviewed by me personally.    ICD-9-CM ICD-10-CM   1. Pain of upper abdomen 789.09 R10.10 POCT CBC     DG Abd 1 View     CT Abdomen Pelvis W Contrast     Signed, Elvina SidleKurt Lauenstein, MD

## 2014-11-04 ENCOUNTER — Other Ambulatory Visit: Payer: Self-pay | Admitting: Family Medicine

## 2014-11-04 ENCOUNTER — Encounter (HOSPITAL_COMMUNITY): Payer: Self-pay | Admitting: Surgery

## 2014-11-04 DIAGNOSIS — E876 Hypokalemia: Secondary | ICD-10-CM

## 2014-11-04 DIAGNOSIS — R935 Abnormal findings on diagnostic imaging of other abdominal regions, including retroperitoneum: Secondary | ICD-10-CM

## 2014-11-04 DIAGNOSIS — R1031 Right lower quadrant pain: Secondary | ICD-10-CM

## 2014-11-04 LAB — COMPREHENSIVE METABOLIC PANEL
ALK PHOS: 46 U/L (ref 39–117)
ALT: 12 U/L (ref 0–53)
AST: 14 U/L (ref 0–37)
Albumin: 2.9 g/dL — ABNORMAL LOW (ref 3.5–5.2)
Anion gap: 7 (ref 5–15)
BILIRUBIN TOTAL: 0.6 mg/dL (ref 0.3–1.2)
BUN: 19 mg/dL (ref 6–23)
CO2: 30 mmol/L (ref 19–32)
CREATININE: 1.12 mg/dL (ref 0.50–1.35)
Calcium: 8.2 mg/dL — ABNORMAL LOW (ref 8.4–10.5)
Chloride: 101 mmol/L (ref 96–112)
GFR calc Af Amer: 73 mL/min — ABNORMAL LOW (ref >90)
GFR calc non Af Amer: 63 mL/min — ABNORMAL LOW (ref >90)
GLUCOSE: 112 mg/dL — AB (ref 70–99)
POTASSIUM: 3.3 mmol/L — AB (ref 3.5–5.1)
Sodium: 138 mmol/L (ref 135–145)
Total Protein: 5.6 g/dL — ABNORMAL LOW (ref 6.0–8.3)

## 2014-11-04 LAB — CBC
HEMATOCRIT: 38.9 % — AB (ref 39.0–52.0)
Hemoglobin: 12.8 g/dL — ABNORMAL LOW (ref 13.0–17.0)
MCH: 30.1 pg (ref 26.0–34.0)
MCHC: 32.9 g/dL (ref 30.0–36.0)
MCV: 91.5 fL (ref 78.0–100.0)
PLATELETS: 233 10*3/uL (ref 150–400)
RBC: 4.25 MIL/uL (ref 4.22–5.81)
RDW: 13.6 % (ref 11.5–15.5)
WBC: 9.8 10*3/uL (ref 4.0–10.5)

## 2014-11-04 LAB — SEDIMENTATION RATE: SED RATE: 17 mm/h — AB (ref 0–16)

## 2014-11-04 LAB — C-REACTIVE PROTEIN: CRP: 4.8 mg/dL — ABNORMAL HIGH (ref <0.60)

## 2014-11-04 MED ORDER — POTASSIUM CHLORIDE 10 MEQ/100ML IV SOLN
10.0000 meq | INTRAVENOUS | Status: AC
  Administered 2014-11-04 (×3): 10 meq via INTRAVENOUS
  Filled 2014-11-04 (×3): qty 100

## 2014-11-04 NOTE — Progress Notes (Signed)
Patient ID: Erik Aguirre, male   DOB: 1941/03/16, 74 y.o.   MRN: [id number]    Subjective: States abdominal pain is better, not resolved but clearly improved.  R mid abd and RLQ  Objective: Vital signs in last 24 hours: Temp:  [97.4 F (36.3 C)-98.1 F (36.7 C)] 97.7 F (36.5 C) (02/28 0427) Pulse Rate:  [61-91] 91 (02/28 0427) Resp:  [16-20] 18 (02/28 0427) BP: (106-157)/(61-97) 110/61 mmHg (02/28 0427) SpO2:  [95 %-99 %] 95 % (02/28 0427) Weight:  [121 lb 8 oz (55.112 kg)-124 lb (56.246 kg)] 121 lb 8 oz (55.112 kg) (02/27 2334) Last BM Date: 11/03/14  Intake/Output from previous day: 02/27 0701 - 02/28 0700 In: 350 [P.O.:150; IV Piggyback:200] Out: -  Intake/Output this shift:    General appearance: alert, cooperative and no distress GI: abnormal findings:  mild tenderness in the RLQ  Lab Results:   Recent Labs  11/03/14 2050 11/04/14 0417  WBC 12.1* 9.8  HGB 13.8 12.8*  HCT 41.5 38.9*  PLT 274 233   BMET  Recent Labs  11/03/14 2050 11/04/14 0417  NA 137 138  K 3.7 3.3*  CL 98 101  CO2 31 30  GLUCOSE 130* 112*  BUN 24* 19  CREATININE 1.17 1.12  CALCIUM 8.9 8.2*     Studies/Results: Dg Abd 1 View  11/03/2014   CLINICAL DATA:  Followup abdominal pain. Does not feel better. Stool is dark brown.  EXAM: ABDOMEN - 1 VIEW  COMPARISON:  None.  FINDINGS: Bowel gas pattern is nonobstructive. Surgical clips are identified within the pelvis. Visualized osseous structures have a normal appearance. No organomegaly.  IMPRESSION: Negative.   Electronically Signed   By: Norva Pavlov M.D.   On: 11/03/2014 13:47   Ct Abdomen Pelvis W Contrast  11/03/2014   CLINICAL DATA:  Epigastric abdominal pain, nausea x4 days, history of hernia repair  EXAM: CT ABDOMEN AND PELVIS WITH CONTRAST  TECHNIQUE: Multidetector CT imaging of the abdomen and pelvis was performed using the standard protocol following bolus administration of intravenous contrast.  CONTRAST:  80 mL Omnipaque 300 IV   COMPARISON:  None.  FINDINGS: Lower chest:  Lung bases are clear.  Hepatobiliary:  Liver is within normal limits.  Gallbladder is unremarkable. No intrahepatic or extrahepatic ductal dilatation.  Pancreas: Within normal limits.  Spleen: Within normal limits.  Adrenals/Urinary Tract: Adrenal glands are unremarkable.  5 mm cyst in the medial left upper kidney (series 5/image 8). Right kidney is within normal limits. No hydronephrosis.  Bladder is mildly trabeculated but otherwise within normal limits.  Stomach/Bowel: Stomach is within normal limits.  No evidence of bowel obstruction.  Abnormal wall thickening involving a loop of ileum in the right mid abdomen (series 2/ images 50-59). Differential considerations include infectious/inflammatory enteritis or small bowel lymphoma.  Appendix is abnormally thickened, measuring up to 8 mm (series 2/image 55), with a 9 mm fluid density lesion at its tip (series 2/image 49). This appearance may reflect acute appendicitis, but is more suggestive of chronic inflammation. An underlying appendiceal lesion such as mucinous neoplasm, appendiceal mucocele, or less likely appendiceal carcinoid is also possible.  Vascular/Lymphatic: Ectasia of the infrarenal abdominal aorta, measuring 2.6 x 2.7 cm just above the iliac bifurcation (series 2/image 45).  Atherosclerotic calcifications of the abdominal aorta and branch vessels.  No suspicious abdominopelvic lymphadenopathy.  Reproductive: Prostatomegaly.  Other: No abdominopelvic ascites.  Postsurgical changes related to bilateral inguinal hernia repair.  Musculoskeletal: Degenerative changes of the visualized thoracolumbar spine.  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  Abnormal appendix, favored to reflect chronic inflammation, less likely acute appendicitis.  Underlying appendiceal lesion/neoplasm is not excluded given the presence of a low-density lesion at the tip of the appendix.  These results will be called to the ordering clinician or representative by the Radiology Department at the imaging location.   Electronically Signed   By: Charline BillsSriyesh  Krishnan M.D.   On: 11/03/2014 17:10    Anti-infectives: Anti-infectives    Start     Dose/Rate Route Frequency Ordered Stop   11/04/14 1000  ciprofloxacin (CIPRO) IVPB 400 mg     400 mg 200 mL/hr over 60 Minutes Intravenous Every 12 hours 11/03/14 2301     11/03/14 2300  metroNIDAZOLE (FLAGYL) IVPB 500 mg     500 mg 100 mL/hr over 60 Minutes Intravenous Every 8 hours 11/03/14 2254     11/03/14 2145  ciprofloxacin (CIPRO) IVPB 400 mg     400 mg 200 mL/hr over 60 Minutes Intravenous  Once 11/03/14 2143 11/03/14 2311   11/03/14 2145  metroNIDAZOLE (FLAGYL) IVPB 500 mg  Status:  Discontinued     500 mg 100 mL/hr over 60 Minutes Intravenous  Once 11/03/14 2143 11/03/14 2332      Assessment/Plan: RLQ pain, ileitis vs lymphoma.  Abnormal appendix that appears chronic, ?mucocele or carcinoid.  Doubt acute appendicitis. Agree with GI eval for SB, will need at least repeat CT to make sure this resolves.  Likely eventual elective surgery for appendectomy or possible SBR depending on subsequent workup.    LOS: 1 day    [name] T 11/04/2014

## 2014-11-04 NOTE — Consult Note (Signed)
Reason for Consult: Abnormal CT scan Referring Physician: [facility]  [place] HPI: This is a 74 year old male with a PMH of HTN admitted for an abnormal CT scan.  He was complaining of abdominal pain for the past 5 days and initially it was thought to be secondary to GERD.  He did not respond to treatment and then a CT scan was performed, which revealed and abnormal thickening of the ileum in the right mid abdomen.  The raises the possibility of a small bowel lymphoma or an inflammatory process.  Additionally, the appendix was abnormally thickened and Surgery feels that he can be treated electively.    Past Medical History  Diagnosis Date  . Hypertension     History reviewed. No pertinent past surgical history.  Family History  Problem Relation Age of Onset  . Throat cancer Brother   . Asthma Son     Social History:  reports that he has never smoked. He has never used smokeless tobacco. He reports that he does not drink alcohol or use illicit drugs.  Allergies:  Allergies  Allergen Reactions  . Lisinopril Cough    Medications:  Scheduled: . atenolol  50 mg Oral Daily  . ciprofloxacin  400 mg Intravenous Q12H  . enoxaparin (LOVENOX) injection  40 mg Subcutaneous QHS  . metronidazole  500 mg Intravenous Q8H   Continuous: . sodium chloride      Results for orders placed or performed during the hospital encounter of 11/03/14 (from the past 24 hour(s))  Urinalysis, Routine w reflex microscopic     Status: Abnormal   Collection Time: 11/03/14  8:43 PM  Result Value Ref Range   Color, Urine YELLOW YELLOW   APPearance CLEAR CLEAR   Specific Gravity, Urine 1.039 (H) 1.005 - 1.030   pH 5.0 5.0 - 8.0   Glucose, UA 250 (A) NEGATIVE mg/dL   Hgb urine dipstick SMALL (A) NEGATIVE   Bilirubin Urine NEGATIVE NEGATIVE   Ketones, ur NEGATIVE NEGATIVE mg/dL   Protein, ur NEGATIVE NEGATIVE mg/dL   Urobilinogen, UA 0.2 0.0 - 1.0 mg/dL   Nitrite NEGATIVE NEGATIVE   Leukocytes,  UA NEGATIVE NEGATIVE  Urine microscopic-add on     Status: None   Collection Time: 11/03/14  8:43 PM  Result Value Ref Range   WBC, UA 0-2 <3 WBC/hpf   Bacteria, UA RARE RARE  CBC with Differential     Status: Abnormal   Collection Time: 11/03/14  8:50 PM  Result Value Ref Range   WBC 12.1 (H) 4.0 - 10.5 K/uL   RBC 4.50 4.22 - 5.81 MIL/uL   Hemoglobin 13.8 13.0 - 17.0 g/dL   HCT 16.141.5 09.639.0 - 04.552.0 %   MCV 92.2 78.0 - 100.0 fL   MCH 30.7 26.0 - 34.0 pg   MCHC 33.3 30.0 - 36.0 g/dL   RDW 40.913.6 81.111.5 - 91.415.5 %   Platelets 274 150 - 400 K/uL   Neutrophils Relative % 71 43 - 77 %   Neutro Abs 8.6 (H) 1.7 - 7.7 K/uL   Lymphocytes Relative 23 12 - 46 %   Lymphs Abs 2.8 0.7 - 4.0 K/uL   Monocytes Relative 5 3 - 12 %   Monocytes Absolute 0.6 0.1 - 1.0 K/uL   Eosinophils Relative 1 0 - 5 %   Eosinophils Absolute 0.1 0.0 - 0.7 K/uL   Basophils Relative 0 0 - 1 %   Basophils Absolute 0.0 0.0 - 0.1 K/uL  Comprehensive metabolic panel  Status: Abnormal   Collection Time: 11/03/14  8:50 PM  Result Value Ref Range   Sodium 137 135 - 145 mmol/L   Potassium 3.7 3.5 - 5.1 mmol/L   Chloride 98 96 - 112 mmol/L   CO2 31 19 - 32 mmol/L   Glucose, Bld 130 (H) 70 - 99 mg/dL   BUN 24 (H) 6 - 23 mg/dL   Creatinine, Ser 1.61 0.50 - 1.35 mg/dL   Calcium 8.9 8.4 - 09.6 mg/dL   Total Protein 6.8 6.0 - 8.3 g/dL   Albumin 3.6 3.5 - 5.2 g/dL   AST 20 0 - 37 U/L   ALT 16 0 - 53 U/L   Alkaline Phosphatase 65 39 - 117 U/L   Total Bilirubin 0.3 0.3 - 1.2 mg/dL   GFR calc non Af Amer 60 (L) >90 mL/min   GFR calc Af Amer 70 (L) >90 mL/min   Anion gap 8 5 - 15  Lipase, blood     Status: None   Collection Time: 11/03/14  8:50 PM  Result Value Ref Range   Lipase 20 11 - 59 U/L  Comprehensive metabolic panel     Status: Abnormal   Collection Time: 11/04/14  4:17 AM  Result Value Ref Range   Sodium 138 135 - 145 mmol/L   Potassium 3.3 (L) 3.5 - 5.1 mmol/L   Chloride 101 96 - 112 mmol/L   CO2 30 19 - 32  mmol/L   Glucose, Bld 112 (H) 70 - 99 mg/dL   BUN 19 6 - 23 mg/dL   Creatinine, Ser 0.45 0.50 - 1.35 mg/dL   Calcium 8.2 (L) 8.4 - 10.5 mg/dL   Total Protein 5.6 (L) 6.0 - 8.3 g/dL   Albumin 2.9 (L) 3.5 - 5.2 g/dL   AST 14 0 - 37 U/L   ALT 12 0 - 53 U/L   Alkaline Phosphatase 46 39 - 117 U/L   Total Bilirubin 0.6 0.3 - 1.2 mg/dL   GFR calc non Af Amer 63 (L) >90 mL/min   GFR calc Af Amer 73 (L) >90 mL/min   Anion gap 7 5 - 15  CBC     Status: Abnormal   Collection Time: 11/04/14  4:17 AM  Result Value Ref Range   WBC 9.8 4.0 - 10.5 K/uL   RBC 4.25 4.22 - 5.81 MIL/uL   Hemoglobin 12.8 (L) 13.0 - 17.0 g/dL   HCT 40.9 (L) 81.1 - 91.4 %   MCV 91.5 78.0 - 100.0 fL   MCH 30.1 26.0 - 34.0 pg   MCHC 32.9 30.0 - 36.0 g/dL   RDW 78.2 95.6 - 21.3 %   Platelets 233 150 - 400 K/uL  Sedimentation rate     Status: Abnormal   Collection Time: 11/04/14  4:17 AM  Result Value Ref Range   Sed Rate 17 (H) 0 - 16 mm/hr     Dg Abd 1 View  11/03/2014   CLINICAL DATA:  Followup abdominal pain. Does not feel better. Stool is dark brown.  EXAM: ABDOMEN - 1 VIEW  COMPARISON:  None.  FINDINGS: Bowel gas pattern is nonobstructive. Surgical clips are identified within the pelvis. Visualized osseous structures have a normal appearance. No organomegaly.  IMPRESSION: Negative.   Electronically Signed   By: Norva Pavlov M.D.   On: 11/03/2014 13:47   Ct Abdomen Pelvis W Contrast  11/03/2014   CLINICAL DATA:  Epigastric abdominal pain, nausea x4 days, history of hernia repair  EXAM: CT  ABDOMEN AND PELVIS WITH CONTRAST  TECHNIQUE: Multidetector CT imaging of the abdomen and pelvis was performed using the standard protocol following bolus administration of intravenous contrast.  CONTRAST:  80 mL Omnipaque 300 IV  COMPARISON:  None.  FINDINGS: Lower chest:  Lung bases are clear.  Hepatobiliary:  Liver is within normal limits.  Gallbladder is unremarkable. No intrahepatic or extrahepatic ductal dilatation.   Pancreas: Within normal limits.  Spleen: Within normal limits.  Adrenals/Urinary Tract: Adrenal glands are unremarkable.  5 mm cyst in the medial left upper kidney (series 5/image 8). Right kidney is within normal limits. No hydronephrosis.  Bladder is mildly trabeculated but otherwise within normal limits.  Stomach/Bowel: Stomach is within normal limits.  No evidence of bowel obstruction.  Abnormal wall thickening involving a loop of ileum in the right mid abdomen (series 2/ images 50-59). Differential considerations include infectious/inflammatory enteritis or small bowel lymphoma.  Appendix is abnormally thickened, measuring up to 8 mm (series 2/image 55), with a 9 mm fluid density lesion at its tip (series 2/image 49). This appearance may reflect acute appendicitis, but is more suggestive of chronic inflammation. An underlying appendiceal lesion such as mucinous neoplasm, appendiceal mucocele, or less likely appendiceal carcinoid is also possible.  Vascular/Lymphatic: Ectasia of the infrarenal abdominal aorta, measuring 2.6 x 2.7 cm just above the iliac bifurcation (series 2/image 45).  Atherosclerotic calcifications of the abdominal aorta and branch vessels.  No suspicious abdominopelvic lymphadenopathy.  Reproductive: Prostatomegaly.  Other: No abdominopelvic ascites.  Postsurgical changes related to bilateral inguinal hernia repair.  Musculoskeletal: Degenerative changes of the visualized thoracolumbar spine.  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  Abnormal appendix, favored to reflect chronic inflammation, less likely acute appendicitis. Underlying appendiceal lesion/neoplasm is not excluded given the presence of a low-density lesion at the tip of the appendix.  These results will be called to the ordering clinician or representative  by the Radiology Department at the imaging location.   Electronically Signed   By: Charline Bills M.D.   On: 11/03/2014 17:10    ROS:  As stated above in the HPI otherwise negative.  Blood pressure 110/61, pulse 91, temperature 97.7 F (36.5 C), temperature source Oral, resp. rate 18, height  (1.651 m), weight 55.112 kg (121 lb 8 oz), SpO2 95 %.    PE: Gen: NAD, Alert and Oriented HEENT:  [place]/AT, EOMI Neck: Supple, no LAD Lungs: CTA Bilaterally CV: RRR without M/G/R ABM: Soft, NTND, some mild tenderness infraumbilical, +BS Ext: No C/C/E  Assessment/Plan: 1) Enteritis. 2) Chronic appendicitis.   It is difficult to discern the etiology of the small bowel findings, but he reports feeling mildly better.  His WBC has decreased.  This may be an infectious source versus inflammatory or malignant.  I agree with the current antibiotic regimen to see if he will respond.  The area in question is not easy to access with the standard endoscopy cameras, but a capsule endoscopy may be considered.  No evidence of any obstruction on the scan.  Plan: 1) Continue with Cipro/Flagyl.   Kiwana Deblasi D 11/04/2014, 11:24 AM

## 2014-11-04 NOTE — Progress Notes (Signed)
ANTIBIOTIC CONSULT NOTE - INITIAL  Pharmacy Consult for Cipro Indication: Intra-abdominal infection  Allergies  Allergen Reactions  . Lisinopril Cough    Patient Measurements: Height: 5\' 5"  (165.1 cm) Weight: 121 lb 8 oz (55.112 kg) IBW/kg (Calculated) : 61.5   Vital Signs: Temp: 97.9 F (36.6 C) (02/27 2334) Temp Source: Oral (02/27 2334) BP: 157/97 mmHg (02/27 2334) Pulse Rate: 70 (02/27 2334) Intake/Output from previous day:   Intake/Output from this shift:    Labs:  Recent Labs  11/01/14 1510 11/01/14 1533 11/03/14 1242 11/03/14 2050  WBC 13.8*  --  14.6* 12.1*  HGB 14.5  --  14.6 13.8  PLT  --   --   --  274  CREATININE  --  1.28  --  1.17   Estimated Creatinine Clearance: 43.8 mL/min (by C-G formula based on Cr of 1.17). No results for input(s): VANCOTROUGH, VANCOPEAK, VANCORANDOM, GENTTROUGH, GENTPEAK, GENTRANDOM, TOBRATROUGH, TOBRAPEAK, TOBRARND, AMIKACINPEAK, AMIKACINTROU, AMIKACIN in the last 72 hours.   Microbiology: No results found for this or any previous visit (from the past 720 hour(s)).  Medical History: Past Medical History  Diagnosis Date  . Hypertension     Assessment: [age] yoM c/o abdominal pain scan which showed inflammation of ileus and appendix.surgery consulted. Cipro per Rx and flagyl per MD for intra-abdominal infection.   Goal of Therapy:  Treat infection  Plan:   Cipro 400mg  IV q12h  F/u SCr/cultures as needed  Lorenza EvangelistGreen, Delwin Raczkowski R 11/04/2014,12:43 AM

## 2014-11-04 NOTE — Plan of Care (Signed)
Problem: Phase I Progression Outcomes Goal: Pain controlled with appropriate interventions Outcome: Progressing Hurts when pressed around his belly button

## 2014-11-04 NOTE — Plan of Care (Signed)
Problem: Phase I Progression Outcomes Goal: Hemodynamically stable Outcome: Progressing Consult with surgery and GI MD's

## 2014-11-04 NOTE — Progress Notes (Signed)
[facility]                                                                              Patient Demographics  Erik Aguirre, is a 74 y.o. male, DOB - 05-11-1941, ZOX:[id number]  Admit date - 11/03/2014   Admitting Physician Lynden Oxford, MD  Outpatient Primary MD for the patient is STEVENS, Lambert Keto, PA  LOS - 1   Chief Complaint  Patient presents with  . Abdominal Pain      HPI on 11/03/2014 by Dr. Lynden Oxford Erik Aguirre is a 74 y.o. male with Past medical history of hypertension. Patient presents with complaints of abdominal pain presentation since last one to 2 weeks. The pain is located in his umbilical area.  The pain is really Endocrine. Patient denies any nausea vomiting diarrhea or constipation or active bleeding. Denies any fever but has some chills. Denies any chest pain shortness of breath cough. Denies any travel outside of status. Denies any recent change in his diet. Denies any recent change in his medication. With this complaint the patient was seen in the urgent care and had an CT scan ordered. A CT scan was showing entritis that the patient was sent here for further workup. The patient is coming from home. And at his baseline independent for most of his ADL.  Assessment & Plan   Abdominal Pain/ ileitis vs lymphoma -Patient continues to have some abdominal pain, periumbilical and RLQ- however improved -CT abd/pelvis: Infectious/inflammatory enteritis, ?small bowel lymphoma, abnormal appendix -Leukocytosis improved -General surgery consulted and appreciated and recommended GI consult, elective appendectomy, repeat CT -Continue cipro and flagyl, IVF, pain control -Gastroenterology consulted and appreciated  Hypokalemia -Possibly secondary to diuretics -Will replace and continue to monitor BMP  Essential Hypertension -Continue atenolol -Chlorthalidone held  Hyperlipidemia -Statin held due to NPO status  Code Status: Full  Family Communication: Wife at  bedside  Disposition Plan: Admitted.  Pending GI evaluation.   Time Spent in minutes   30 minutes  Procedures  None  Consults   General surgery Gastroenterology  DVT Prophylaxis  Lovenox  Lab Results  Component Value Date   PLT 233 11/04/2014    Medications  Scheduled Meds: . atenolol  50 mg Oral Daily  . ciprofloxacin  400 mg Intravenous Q12H  . enoxaparin (LOVENOX) injection  40 mg Subcutaneous QHS  . metronidazole  500 mg Intravenous Q8H   Continuous Infusions: . sodium chloride     PRN Meds:.acetaminophen **OR** acetaminophen, HYDROcodone-acetaminophen, morphine injection, ondansetron **OR** ondansetron (ZOFRAN) IV  Antibiotics    Anti-infectives    Start     Dose/Rate Route Frequency Ordered Stop   11/04/14 1000  ciprofloxacin (CIPRO) IVPB 400 mg     400 mg 200 mL/hr over 60 Minutes Intravenous Every 12 hours 11/03/14 2301     11/03/14 2300  metroNIDAZOLE (FLAGYL) IVPB 500 mg     500 mg 100 mL/hr over 60 Minutes Intravenous Every 8 hours 11/03/14 2254     11/03/14 2145  ciprofloxacin (CIPRO) IVPB 400 mg     400 mg 200 mL/hr over 60 Minutes Intravenous  Once 11/03/14 2143 11/03/14 2311   11/03/14 2145  metroNIDAZOLE (FLAGYL) IVPB  500 mg  Status:  Discontinued     500 mg 100 mL/hr over 60 Minutes Intravenous  Once 11/03/14 2143 11/03/14 2332      Subjective:   Erik Aguirre seen and examined today. Patient feels his abdominal pain has improved.  He denies chest pain, shortness of breath.   Objective:   Filed Vitals:   11/03/14 2251 11/03/14 2257 11/03/14 2334 11/04/14 0427  BP:  155/86 157/97 110/61  Pulse: 61 63 70 91  Temp:   97.9 F (36.6 C) 97.7 F (36.5 C)  TempSrc:   Oral Oral  Resp:  18 18 18   Height:   5\' 5"  (1.651 m)   Weight:   55.112 kg (121 lb 8 oz)   SpO2: 97% 96% 96% 95%    Wt Readings from Last 3 Encounters:  11/03/14 55.112 kg (121 lb 8 oz)  11/03/14 56.246 kg (124 lb)  11/01/14 57.607 kg (127 lb)     Intake/Output Summary  (Last 24 hours) at 11/04/14 1107 Last data filed at 11/04/14 [id number]  Gross per 24 hour  Intake    350 ml  Output      0 ml  Net    350 ml    Exam  General: Well developed, well nourished, NAD, appears stated age  HEENT: NCAT, mucous membranes moist.   Cardiovascular: S1 S2 auscultated, no rubs, murmurs or gallops. Regular rate and rhythm.  Respiratory: Clear to auscultation bilaterally with equal chest rise  Abdomen: Soft, Umbilical and RLQ TTP, nondistended, + bowel sounds  Extremities: warm dry without cyanosis clubbing or edema  Neuro: AAOx3, nonfocal  Psych: Appropriate  Data Review   Micro Results No results found for this or any previous visit (from the past 240 hour(s)).  Radiology Reports Dg Abd 1 View  11/03/2014   CLINICAL DATA:  Followup abdominal pain. Does not feel better. Stool is dark brown.  EXAM: ABDOMEN - 1 VIEW  COMPARISON:  None.  FINDINGS: Bowel gas pattern is nonobstructive. Surgical clips are identified within the pelvis. Visualized osseous structures have a normal appearance. No organomegaly.  IMPRESSION: Negative.   Electronically Signed   By: Norva PavlovElizabeth  Brown M.D.   On: 11/03/2014 13:47   Ct Abdomen Pelvis W Contrast  11/03/2014   CLINICAL DATA:  Epigastric abdominal pain, nausea x4 days, history of hernia repair  EXAM: CT ABDOMEN AND PELVIS WITH CONTRAST  TECHNIQUE: Multidetector CT imaging of the abdomen and pelvis was performed using the standard protocol following bolus administration of intravenous contrast.  CONTRAST:  80 mL Omnipaque 300 IV  COMPARISON:  None.  FINDINGS: Lower chest:  Lung bases are clear.  Hepatobiliary:  Liver is within normal limits.  Gallbladder is unremarkable. No intrahepatic or extrahepatic ductal dilatation.  Pancreas: Within normal limits.  Spleen: Within normal limits.  Adrenals/Urinary Tract: Adrenal glands are unremarkable.  5 mm cyst in the medial left upper kidney (series 5/image 8). Right kidney is within normal  limits. No hydronephrosis.  Bladder is mildly trabeculated but otherwise within normal limits.  Stomach/Bowel: Stomach is within normal limits.  No evidence of bowel obstruction.  Abnormal wall thickening involving a loop of ileum in the right mid abdomen (series 2/ images 50-59). Differential considerations include infectious/inflammatory enteritis or small bowel lymphoma.  Appendix is abnormally thickened, measuring up to 8 mm (series 2/image 55), with a 9 mm fluid density lesion at its tip (series 2/image 49). This appearance may reflect acute appendicitis, but is more suggestive of chronic inflammation. An  underlying appendiceal lesion such as mucinous neoplasm, appendiceal mucocele, or less likely appendiceal carcinoid is also possible.  Vascular/Lymphatic: Ectasia of the infrarenal abdominal aorta, measuring 2.6 x 2.7 cm just above the iliac bifurcation (series 2/image 45).  Atherosclerotic calcifications of the abdominal aorta and branch vessels.  No suspicious abdominopelvic lymphadenopathy.  Reproductive: Prostatomegaly.  Other: No abdominopelvic ascites.  Postsurgical changes related to bilateral inguinal hernia repair.  Musculoskeletal: Degenerative changes of the visualized thoracolumbar spine.  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  Abnormal appendix, favored to reflect chronic inflammation, less likely acute appendicitis. Underlying appendiceal lesion/neoplasm is not excluded given the presence of a low-density lesion at the tip of the appendix.  These results will be called to the ordering clinician or representative by the Radiology Department at the imaging location.   Electronically Signed   By: Charline Bills M.D.   On: 11/03/2014 17:10    CBC  Recent Labs Lab 11/01/14 1510 11/03/14 1242 11/03/14 2050  11/04/14 0417  WBC 13.8* 14.6* 12.1* 9.8  HGB 14.5 14.6 13.8 12.8*  HCT 45.3 45.6 41.5 38.9*  PLT  --   --  274 233  MCV 92.9 93.9 92.2 91.5  MCH 29.7 30.2 30.7 30.1  MCHC 32.0 32.1 33.3 32.9  RDW  --   --  13.6 13.6  LYMPHSABS  --   --  2.8  --   MONOABS  --   --  0.6  --   EOSABS  --   --  0.1  --   BASOSABS  --   --  0.0  --     Chemistries   Recent Labs Lab 11/01/14 1533 11/03/14 2050 11/04/14 0417  NA 136 137 138  K 4.4 3.7 3.3*  CL 98 98 101  CO2 GLUCOSE 185* 130* 112*  BUN 26* 24* 19  CREATININE 1.28 1.17 1.12  CALCIUM 9.2 8.9 8.2*  AST ALT ALKPHOS 52 65 46  BILITOT 0.4 0.3 0.6   ------------------------------------------------------------------------------------------------------------------ estimated creatinine clearance is 45.8 mL/min (by C-G formula based on Cr of 1.12). ------------------------------------------------------------------------------------------------------------------  Recent Labs  11/01/14 1510  HGBA1C 6.3   ------------------------------------------------------------------------------------------------------------------ No results for input(s): CHOL, HDL, LDLCALC, TRIG, CHOLHDL, LDLDIRECT in the last 72 hours. ------------------------------------------------------------------------------------------------------------------ No results for input(s): TSH, T4TOTAL, T3FREE, THYROIDAB in the last 72 hours.  Invalid input(s): FREET3 ------------------------------------------------------------------------------------------------------------------ No results for input(s): VITAMINB12, FOLATE, FERRITIN, TIBC, IRON, RETICCTPCT in the last 72 hours.  Coagulation profile No results for input(s): INR, PROTIME in the last 168 hours.  No results for input(s): DDIMER in the last 72 hours.  Cardiac Enzymes No results for input(s): CKMB, TROPONINI, MYOGLOBIN in the last 168 hours.  Invalid input(s):  CK ------------------------------------------------------------------------------------------------------------------ Invalid input(s): POCBNP    Travante Knee D.O. on 11/04/2014 at 11:07 AM  Between 7am to 7pm - Pager - [phone]  After 7pm go to [email or web address] - password [email or web address]  And look for the night coverage person covering for me after hours  [facility] Office  [phone]

## 2014-11-05 LAB — BASIC METABOLIC PANEL
Anion gap: 7 (ref 5–15)
BUN: 17 mg/dL (ref 6–23)
CHLORIDE: 108 mmol/L (ref 96–112)
CO2: 25 mmol/L (ref 19–32)
Calcium: 8.3 mg/dL — ABNORMAL LOW (ref 8.4–10.5)
Creatinine, Ser: 1.2 mg/dL (ref 0.50–1.35)
GFR calc non Af Amer: 58 mL/min — ABNORMAL LOW (ref >90)
GFR, EST AFRICAN AMERICAN: 67 mL/min — AB (ref >90)
GLUCOSE: 93 mg/dL (ref 70–99)
POTASSIUM: 3.1 mmol/L — AB (ref 3.5–5.1)
Sodium: 140 mmol/L (ref 135–145)

## 2014-11-05 LAB — CBC
HCT: 39.1 % (ref 39.0–52.0)
HEMOGLOBIN: 12.8 g/dL — AB (ref 13.0–17.0)
MCH: 30.3 pg (ref 26.0–34.0)
MCHC: 32.7 g/dL (ref 30.0–36.0)
MCV: 92.7 fL (ref 78.0–100.0)
PLATELETS: 244 10*3/uL (ref 150–400)
RBC: 4.22 MIL/uL (ref 4.22–5.81)
RDW: 13.6 % (ref 11.5–15.5)
WBC: 7.7 10*3/uL (ref 4.0–10.5)

## 2014-11-05 MED ORDER — SODIUM CHLORIDE 0.9 % IV SOLN
INTRAVENOUS | Status: DC
  Administered 2014-11-05 (×2): via INTRAVENOUS
  Filled 2014-11-05 (×4): qty 1000

## 2014-11-05 MED ORDER — POTASSIUM CHLORIDE 10 MEQ/100ML IV SOLN
10.0000 meq | INTRAVENOUS | Status: AC
  Administered 2014-11-05 (×6): 10 meq via INTRAVENOUS
  Filled 2014-11-05 (×5): qty 100

## 2014-11-05 NOTE — Plan of Care (Signed)
Problem: Phase III Progression Outcomes Goal: Voiding independently Outcome: Completed/Met Date Met:  11/05/14 Voiding but not saving in urinal.

## 2014-11-05 NOTE — Progress Notes (Signed)
Subjective: Feeling better.  Much less abdominal pain.  Objective: Vital signs in last 24 hours: Temp:  [98.2 F (36.8 C)-98.3 F (36.8 C)] 98.2 F (36.8 C) (02/29 0443) Pulse Rate:  [60-99] 64 (02/29 0443) Resp:  [16-18] 18 (02/29 0443) BP: (118-142)/(76-83) 118/80 mmHg (02/29 0443) SpO2:  [95 %-100 %] 96 % (02/29 0443) Weight:  [55.974 kg (123 lb 6.4 oz)] 55.974 kg (123 lb 6.4 oz) (02/29 0500) Last BM Date: 11/04/14 (loose per pt x1)  Intake/Output from previous day: 02/28 0701 - 02/29 0700 In: 1617.5 [I.V.:917.5; IV Piggyback:700] Out: -  Intake/Output this shift:    General appearance: alert and no distress GI: soft, non-tender; bowel sounds normal; no masses,  no organomegaly  Lab Results:  Recent Labs  11/03/14 2050 11/04/14 0417 11/05/14 0500  WBC 12.1* 9.8 7.7  HGB 13.8 12.8* 12.8*  HCT 41.5 38.9* 39.1  PLT 274 233 244   BMET  Recent Labs  11/03/14 2050 11/04/14 0417 11/05/14 0500  NA 137 138 140  K 3.7 3.3* 3.1*  CL 98 101 108  CO2 GLUCOSE 130* 112* 93  BUN 24* 19 17  CREATININE 1.17 1.12 1.20  CALCIUM 8.9 8.2* 8.3*   LFT  Recent Labs  11/04/14 0417  PROT 5.6*  ALBUMIN 2.9*  AST 14  ALT 12  ALKPHOS 46  BILITOT 0.6   PT/INR No results for input(s): LABPROT, INR in the last 72 hours. Hepatitis Panel No results for input(s): HEPBSAG, HCVAB, HEPAIGM, HEPBIGM in the last 72 hours. C-Diff No results for input(s): CDIFFTOX in the last 72 hours. Fecal Lactopherrin No results for input(s): FECLLACTOFRN in the last 72 hours.  Studies/Results: Dg Abd 1 View  11/03/2014   CLINICAL DATA:  Followup abdominal pain. Does not feel better. Stool is dark brown.  EXAM: ABDOMEN - 1 VIEW  COMPARISON:  None.  FINDINGS: Bowel gas pattern is nonobstructive. Surgical clips are identified within the pelvis. Visualized osseous structures have a normal appearance. No organomegaly.  IMPRESSION: Negative.   Electronically Signed   By: Norva Pavlov  M.D.   On: 11/03/2014 13:47   Ct Abdomen Pelvis W Contrast  11/03/2014   CLINICAL DATA:  Epigastric abdominal pain, nausea x4 days, history of hernia repair  EXAM: CT ABDOMEN AND PELVIS WITH CONTRAST  TECHNIQUE: Multidetector CT imaging of the abdomen and pelvis was performed using the standard protocol following bolus administration of intravenous contrast.  CONTRAST:  80 mL Omnipaque 300 IV  COMPARISON:  None.  FINDINGS: Lower chest:  Lung bases are clear.  Hepatobiliary:  Liver is within normal limits.  Gallbladder is unremarkable. No intrahepatic or extrahepatic ductal dilatation.  Pancreas: Within normal limits.  Spleen: Within normal limits.  Adrenals/Urinary Tract: Adrenal glands are unremarkable.  5 mm cyst in the medial left upper kidney (series 5/image 8). Right kidney is within normal limits. No hydronephrosis.  Bladder is mildly trabeculated but otherwise within normal limits.  Stomach/Bowel: Stomach is within normal limits.  No evidence of bowel obstruction.  Abnormal wall thickening involving a loop of ileum in the right mid abdomen (series 2/ images 50-59). Differential considerations include infectious/inflammatory enteritis or small bowel lymphoma.  Appendix is abnormally thickened, measuring up to 8 mm (series 2/image 55), with a 9 mm fluid density lesion at its tip (series 2/image 49). This appearance may reflect acute appendicitis, but is more suggestive of chronic inflammation. An underlying appendiceal lesion such as mucinous neoplasm, appendiceal mucocele, or less likely appendiceal  carcinoid is also possible.  Vascular/Lymphatic: Ectasia of the infrarenal abdominal aorta, measuring 2.6 x 2.7 cm just above the iliac bifurcation (series 2/image 45).  Atherosclerotic calcifications of the abdominal aorta and branch vessels.  No suspicious abdominopelvic lymphadenopathy.  Reproductive: Prostatomegaly.  Other: No abdominopelvic ascites.  Postsurgical changes related to bilateral inguinal hernia  repair.  Musculoskeletal: Degenerative changes of the visualized thoracolumbar spine.  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  Abnormal appendix, favored to reflect chronic inflammation, less likely acute appendicitis. Underlying appendiceal lesion/neoplasm is not excluded given the presence of a low-density lesion at the tip of the appendix.  These results will be called to the ordering clinician or representative by the Radiology Department at the imaging location.   Electronically Signed   By: Charline BillsSriyesh  Krishnan M.D.   On: 11/03/2014 17:10    Medications:  Scheduled: . atenolol  50 mg Oral Daily  . ciprofloxacin  400 mg Intravenous Q12H  . enoxaparin (LOVENOX) injection  40 mg Subcutaneous QHS  . metronidazole  500 mg Intravenous Q8H  . potassium chloride  10 mEq Intravenous Q1 Hr x 5   Continuous: . sodium chloride 0.9 % 1,000 mL with potassium chloride 20 mEq infusion 75 mL/hr at 11/05/14 0636    Assessment/Plan: 1) Enteritis - Presumed infectious.   He is much better with antibiotics.    Plan: 1) Okay to D/C home today from GI standpoint. 2) Treated with Cipro/Flagyl for a total of 10 days. 3) I will have him follow up in 2 weeks.   LOS: 2 days   Erik Aguirre D 11/05/2014, 7:21 AM

## 2014-11-05 NOTE — Plan of Care (Signed)
Problem: Phase I Progression Outcomes Goal: Pain controlled with appropriate interventions Outcome: Progressing C/o pain 3/10 in abdomen. Denies need for prns.

## 2014-11-05 NOTE — Progress Notes (Signed)
Paged Camila LiOsman again. Unsure if got message.

## 2014-11-05 NOTE — Progress Notes (Addendum)
Paged MD on call Camila Lisman via [email or web address] about K+ of 3.1

## 2014-11-05 NOTE — Progress Notes (Signed)
[facility]                                                                              Patient Demographics  Erik Aguirre, is a 74 y.o. male, DOB - 05/31/1941, ZOX:[id number]  Admit date - 11/03/2014   Admitting Physician Lynden OxfordPranav Patel, MD  Outpatient Primary MD for the patient is STEVENS, Lambert KetoLISA LAJUANA, PA  LOS - 2   Chief Complaint  Patient presents with  . Abdominal Pain      HPI on 11/03/2014 by Dr. Lynden OxfordPranav Patel Erik Aguirre is a 74 y.o. male with Past medical history of hypertension. Patient presents with complaints of abdominal pain presentation since last one to 2 weeks. The pain is located in his umbilical area.  The pain is really Endocrine. Patient denies any nausea vomiting diarrhea or constipation or active bleeding. Denies any fever but has some chills. Denies any chest pain shortness of breath cough. Denies any travel outside of status. Denies any recent change in his diet. Denies any recent change in his medication. With this complaint the patient was seen in the urgent care and had an CT scan ordered. A CT scan was showing entritis that the patient was sent here for further workup. The patient is coming from home. And at his baseline independent for most of his ADL.  Assessment & Plan   Abdominal Pain/ Enteritis vs lymphoma -Patient continues to have some abdominal pain, periumbilical and RLQ- however improved -CT abd/pelvis: Infectious/inflammatory enteritis, ?small bowel lymphoma, abnormal appendix -Leukocytosis improved -General surgery consulted and appreciated and recommended GI consult, elective appendectomy, continue antibiotic course, then repeat CT  -Continue cipro and flagyl, IVF, pain control -Gastroenterology consulted and appreciated and recommended 10 days of antibiotics with outpatient follow up 2 weeks -Will advance diet today  -If patient is able to tolerate, will plan on d/c 11/06/2014  Hypokalemia -Possibly secondary to diuretics -Will replace and  continue to monitor BMP  Essential Hypertension -Continue atenolol -Chlorthalidone held  Hyperlipidemia -Statin was held due to NPO status -Will restart once patient is able to tolerate diet  Code Status: Full  Family Communication: Wife at bedside  Disposition Plan: Admitted.  Will advance patient's diet to clear liquid and as tolerated.  Possible d/c 11/06/14  Time Spent in minutes   30 minutes  Procedures  None  Consults   General surgery Gastroenterology  DVT Prophylaxis  Lovenox  Lab Results  Component Value Date   PLT 244 11/05/2014    Medications  Scheduled Meds: . atenolol  50 mg Oral Daily  . ciprofloxacin  400 mg Intravenous Q12H  . enoxaparin (LOVENOX) injection  40 mg Subcutaneous QHS  . metronidazole  500 mg Intravenous Q8H   Continuous Infusions: . sodium chloride 0.9 % 1,000 mL with potassium chloride 20 mEq infusion 75 mL/hr at 11/05/14 0636   PRN Meds:.acetaminophen **OR** acetaminophen, HYDROcodone-acetaminophen, morphine injection, ondansetron **OR** ondansetron (ZOFRAN) IV  Antibiotics    Anti-infectives    Start     Dose/Rate Route Frequency Ordered Stop   11/04/14 1000  ciprofloxacin (CIPRO) IVPB 400 mg     400 mg 200 mL/hr over 60 Minutes Intravenous Every 12 hours 11/03/14 2301  11/03/14 2300  metroNIDAZOLE (FLAGYL) IVPB 500 mg     500 mg 100 mL/hr over 60 Minutes Intravenous Every 8 hours 11/03/14 2254     11/03/14 2145  ciprofloxacin (CIPRO) IVPB 400 mg     400 mg 200 mL/hr over 60 Minutes Intravenous  Once 11/03/14 2143 11/03/14 2311   11/03/14 2145  metroNIDAZOLE (FLAGYL) IVPB 500 mg  Status:  Discontinued     500 mg 100 mL/hr over 60 Minutes Intravenous  Once 11/03/14 2143 11/03/14 2332      Subjective:   Erik Barge seen and examined today. Patient feels his abdominal pain has improved.  He denies chest pain, shortness of breath, nausea, vomiting.   Objective:   Filed Vitals:   11/04/14 2053 11/05/14 0443 11/05/14  0500 11/05/14 0946  BP: 142/83 118/80  126/69  Pulse: 62 64  60  Temp: 98.3 F (36.8 C) 98.2 F (36.8 C)    TempSrc: Oral Oral    Resp: 18 18    Height:      Weight:   55.974 kg (123 lb 6.4 oz)   SpO2: 98% 96%      Wt Readings from Last 3 Encounters:  11/05/14 55.974 kg (123 lb 6.4 oz)  11/03/14 56.246 kg (124 lb)  11/01/14 57.607 kg (127 lb)     Intake/Output Summary (Last 24 hours) at 11/05/14 1251 Last data filed at 11/05/14 1147  Gross per 24 hour  Intake 2886.25 ml  Output      0 ml  Net 2886.25 ml    Exam  General: Well developed, well nourished, NAD  HEENT: NCAT, mucous membranes moist.   Cardiovascular: S1 S2 auscultated, RRR   Respiratory: Clear to auscultation  Abdomen: Soft, RLQ TTP, nondistended, + bowel sounds  Neuro: AAOx3, nonfocal   Data Review   Micro Results No results found for this or any previous visit (from the past 240 hour(s)).  Radiology Reports Dg Abd 1 View  11/03/2014   CLINICAL DATA:  Followup abdominal pain. Does not feel better. Stool is dark brown.  EXAM: ABDOMEN - 1 VIEW  COMPARISON:  None.  FINDINGS: Bowel gas pattern is nonobstructive. Surgical clips are identified within the pelvis. Visualized osseous structures have a normal appearance. No organomegaly.  IMPRESSION: Negative.   Electronically Signed   By: Norva Pavlov M.D.   On: 11/03/2014 13:47   Ct Abdomen Pelvis W Contrast  11/03/2014   CLINICAL DATA:  Epigastric abdominal pain, nausea x4 days, history of hernia repair  EXAM: CT ABDOMEN AND PELVIS WITH CONTRAST  TECHNIQUE: Multidetector CT imaging of the abdomen and pelvis was performed using the standard protocol following bolus administration of intravenous contrast.  CONTRAST:  80 mL Omnipaque 300 IV  COMPARISON:  None.  FINDINGS: Lower chest:  Lung bases are clear.  Hepatobiliary:  Liver is within normal limits.  Gallbladder is unremarkable. No intrahepatic or extrahepatic ductal dilatation.  Pancreas: Within normal  limits.  Spleen: Within normal limits.  Adrenals/Urinary Tract: Adrenal glands are unremarkable.  5 mm cyst in the medial left upper kidney (series 5/image 8). Right kidney is within normal limits. No hydronephrosis.  Bladder is mildly trabeculated but otherwise within normal limits.  Stomach/Bowel: Stomach is within normal limits.  No evidence of bowel obstruction.  Abnormal wall thickening involving a loop of ileum in the right mid abdomen (series 2/ images 50-59). Differential considerations include infectious/inflammatory enteritis or small bowel lymphoma.  Appendix is abnormally thickened, measuring up to 8 mm (series 2/image  55), with a 9 mm fluid density lesion at its tip (series 2/image 49). This appearance may reflect acute appendicitis, but is more suggestive of chronic inflammation. An underlying appendiceal lesion such as mucinous neoplasm, appendiceal mucocele, or less likely appendiceal carcinoid is also possible.  Vascular/Lymphatic: Ectasia of the infrarenal abdominal aorta, measuring 2.6 x 2.7 cm just above the iliac bifurcation (series 2/image 45).  Atherosclerotic calcifications of the abdominal aorta and branch vessels.  No suspicious abdominopelvic lymphadenopathy.  Reproductive: Prostatomegaly.  Other: No abdominopelvic ascites.  Postsurgical changes related to bilateral inguinal hernia repair.  Musculoskeletal: Degenerative changes of the visualized thoracolumbar spine.  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  Abnormal appendix, favored to reflect chronic inflammation, less likely acute appendicitis. Underlying appendiceal lesion/neoplasm is not excluded given the presence of a low-density lesion at the tip of the appendix.  These results will be called to the ordering clinician or representative by the Radiology  Department at the imaging location.   Electronically Signed   By: Charline Bills M.D.   On: 11/03/2014 17:10    CBC  Recent Labs Lab 11/01/14 1510 11/03/14 1242 11/03/14 2050 11/04/14 0417 11/05/14 0500  WBC 13.8* 14.6* 12.1* 9.8 7.7  HGB 14.5 14.6 13.8 12.8* 12.8*  HCT 45.3 45.6 41.5 38.9* 39.1  PLT  --   --  274 233 244  MCV 92.9 93.9 92.2 91.5 92.7  MCH 29.7 30.2 30.7 30.1 30.3  MCHC 32.0 32.1 33.3 32.9 32.7  RDW  --   --  13.6 13.6 13.6  LYMPHSABS  --   --  2.8  --   --   MONOABS  --   --  0.6  --   --   EOSABS  --   --  0.1  --   --   BASOSABS  --   --  0.0  --   --     Chemistries   Recent Labs Lab 11/01/14 1533 11/03/14 2050 11/04/14 0417 11/05/14 0500  NA 136 137 138 140  K 4.4 3.7 3.3* 3.1*  CL 98 98 101 108  CO2 GLUCOSE 185* 130* 112* 93  BUN 26* 24* 19 17  CREATININE 1.28 1.17 1.12 1.20  CALCIUM 9.2 8.9 8.2* 8.3*  AST --   ALT --   ALKPHOS 52 65 46  --   BILITOT 0.4 0.3 0.6  --    ------------------------------------------------------------------------------------------------------------------ estimated creatinine clearance is 43.4 mL/min (by C-G formula based on Cr of 1.2). ------------------------------------------------------------------------------------------------------------------ No results for input(s): HGBA1C in the last 72 hours. ------------------------------------------------------------------------------------------------------------------ No results for input(s): CHOL, HDL, LDLCALC, TRIG, CHOLHDL, LDLDIRECT in the last 72 hours. ------------------------------------------------------------------------------------------------------------------ No results for input(s): TSH, T4TOTAL, T3FREE, THYROIDAB in the last 72 hours.  Invalid input(s): FREET3 ------------------------------------------------------------------------------------------------------------------ No results for input(s): VITAMINB12,  FOLATE, FERRITIN, TIBC, IRON, RETICCTPCT in the last 72 hours.  Coagulation profile No results for input(s): INR, PROTIME in the last 168 hours.  No results for input(s): DDIMER in the last 72 hours.  Cardiac Enzymes No results for input(s): CKMB, TROPONINI, MYOGLOBIN in the last 168 hours.  Invalid input(s): CK ------------------------------------------------------------------------------------------------------------------ Invalid input(s): POCBNP    Chiniqua Kilcrease D.O. on 11/05/2014 at 12:51 PM  Between 7am to 7pm - Pager - [phone]  After 7pm go to [email or web address] - password [facility]  And look for the night coverage person covering for me after hours  [facility] Office  [phone]

## 2014-11-05 NOTE — Progress Notes (Signed)
Subjective: He says he feels much better.  No pain or distension.  Started clears this AM.    Objective: Vital signs in last 24 hours: Temp:  [98.2 F (36.8 C)-98.3 F (36.8 C)] 98.2 F (36.8 C) (02/29 0443) Pulse Rate:  [60-99] 60 (02/29 0946) Resp:  [16-18] 18 (02/29 0443) BP: (118-142)/(69-83) 126/69 mmHg (02/29 0946) SpO2:  [95 %-100 %] 96 % (02/29 0443) Weight:  [123 lb 6.4 oz (55.974 kg)] 123 lb 6.4 oz (55.974 kg) (02/29 0500) Last BM Date: 11/04/14 (loose per pt x1) Nothing PO recorded 1 stool recorded Diet: clears started this AM Afebrile, VSS K+ 3.1, WBC is normal. Intake/Output from previous day: 02/28 0701 - 02/29 0700 In: 1617.5 [I.V.:917.5; IV Piggyback:700] Out: -   Intake/Output this shift:    General appearance: alert, cooperative and no distress GI: soft, non-tender; bowel sounds normal; no masses,  no organomegaly  Lab Results:   Recent Labs  11/04/14 0417 11/05/14 0500  WBC 9.8 7.7  HGB 12.8* 12.8*  HCT 38.9* 39.1  PLT 233 244    BMET  Recent Labs  11/04/14 0417 11/05/14 0500  NA 138 140  K 3.3* 3.1*  CL 101 108  CO2 30 25  GLUCOSE 112* 93  BUN 19 17  CREATININE 1.12 1.20  CALCIUM 8.2* 8.3*   PT/INR No results for input(s): LABPROT, INR in the last 72 hours.   Recent Labs Lab 11/01/14 1533 11/03/14 2050 11/04/14 0417  AST ALT ALKPHOS 52 65 46  BILITOT 0.4 0.3 0.6  PROT 6.1 6.8 5.6*  ALBUMIN 3.7 3.6 2.9*     Lipase     Component Value Date/Time   LIPASE 20 11/03/2014 2050     Studies/Results: Dg Abd 1 View  11/03/2014   CLINICAL DATA:  Followup abdominal pain. Does not feel better. Stool is dark brown.  EXAM: ABDOMEN - 1 VIEW  COMPARISON:  None.  FINDINGS: Bowel gas pattern is nonobstructive. Surgical clips are identified within the pelvis. Visualized osseous structures have a normal appearance. No organomegaly.  IMPRESSION: Negative.   Electronically Signed   By: Norva Pavlov M.D.   On:  11/03/2014 13:47   Ct Abdomen Pelvis W Contrast  11/03/2014   CLINICAL DATA:  Epigastric abdominal pain, nausea x4 days, history of hernia repair  EXAM: CT ABDOMEN AND PELVIS WITH CONTRAST  TECHNIQUE: Multidetector CT imaging of the abdomen and pelvis was performed using the standard protocol following bolus administration of intravenous contrast.  CONTRAST:  80 mL Omnipaque 300 IV  COMPARISON:  None.  FINDINGS: Lower chest:  Lung bases are clear.  Hepatobiliary:  Liver is within normal limits.  Gallbladder is unremarkable. No intrahepatic or extrahepatic ductal dilatation.  Pancreas: Within normal limits.  Spleen: Within normal limits.  Adrenals/Urinary Tract: Adrenal glands are unremarkable.  5 mm cyst in the medial left upper kidney (series 5/image 8). Right kidney is within normal limits. No hydronephrosis.  Bladder is mildly trabeculated but otherwise within normal limits.  Stomach/Bowel: Stomach is within normal limits.  No evidence of bowel obstruction.  Abnormal wall thickening involving a loop of ileum in the right mid abdomen (series 2/ images 50-59). Differential considerations include infectious/inflammatory enteritis or small bowel lymphoma.  Appendix is abnormally thickened, measuring up to 8 mm (series 2/image 55), with a 9 mm fluid density lesion at its tip (series 2/image 49). This appearance may reflect acute appendicitis, but is more suggestive of chronic inflammation. An  underlying appendiceal lesion such as mucinous neoplasm, appendiceal mucocele, or less likely appendiceal carcinoid is also possible.  Vascular/Lymphatic: Ectasia of the infrarenal abdominal aorta, measuring 2.6 x 2.7 cm just above the iliac bifurcation (series 2/image 45).  Atherosclerotic calcifications of the abdominal aorta and branch vessels.  No suspicious abdominopelvic lymphadenopathy.  Reproductive: Prostatomegaly.  Other: No abdominopelvic ascites.  Postsurgical changes related to bilateral inguinal hernia repair.   Musculoskeletal: Degenerative changes of the visualized thoracolumbar spine.  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  Abnormal appendix, favored to reflect chronic inflammation, less likely acute appendicitis. Underlying appendiceal lesion/neoplasm is not excluded given the presence of a low-density lesion at the tip of the appendix.  These results will be called to the ordering clinician or representative by the Radiology Department at the imaging location.   Electronically Signed   By: Charline BillsSriyesh  Krishnan M.D.   On: 11/03/2014 17:10    Medications: . atenolol  50 mg Oral Daily  . ciprofloxacin  400 mg Intravenous Q12H  . enoxaparin (LOVENOX) injection  40 mg Subcutaneous QHS  . metronidazole  500 mg Intravenous Q8H  . potassium chloride  10 mEq Intravenous Q1 Hr x 5    Assessment/Plan RLQ pain; ileitis vs lymphoma Day 3 of IV Cipro/flagyl Abnormal appendix probable chronic inflammation/possible neoplasm Hx of hypertension  Hypokalemia DVT:  Lovenox/SCD  Plan:  Agree with ongoing antibiotics, and advancing diet as he tolerates it,; replace K+. If he does well on antibiotics and PO, complete course for enteritis and then follow up with CT and discuss what to do about Appendix after repeat CT.    LOS: 2 days    [place] Coco 11/05/2014

## 2014-11-06 LAB — BASIC METABOLIC PANEL
Anion gap: 6 (ref 5–15)
BUN: 16 mg/dL (ref 6–23)
CALCIUM: 8.7 mg/dL (ref 8.4–10.5)
CO2: 26 mmol/L (ref 19–32)
CREATININE: 1.09 mg/dL (ref 0.50–1.35)
Chloride: 108 mmol/L (ref 96–112)
GFR, EST AFRICAN AMERICAN: 76 mL/min — AB (ref >90)
GFR, EST NON AFRICAN AMERICAN: 65 mL/min — AB (ref >90)
Glucose, Bld: 113 mg/dL — ABNORMAL HIGH (ref 70–99)
Potassium: 4.1 mmol/L (ref 3.5–5.1)
Sodium: 140 mmol/L (ref 135–145)

## 2014-11-06 MED ORDER — METRONIDAZOLE 500 MG PO TABS: 500.0000 mg | ORAL_TABLET | Freq: Three times a day (TID) | ORAL | Status: DC

## 2014-11-06 MED ORDER — CIPROFLOXACIN HCL 500 MG PO TABS: 500.0000 mg | ORAL_TABLET | Freq: Two times a day (BID) | ORAL | Status: AC

## 2014-11-06 NOTE — Discharge Summary (Signed)
Physician Discharge Summary  Erik Aguirre WUJ:[id number] DOB: 1940/11/30 DOA: 11/03/2014  PCP: Dow AdolphSTEVENS, LISA LAJUANA, PA  Admit date: 11/03/2014 Discharge date: 11/06/2014  Time spent: 45 minutes  Recommendations for Outpatient Follow-up:  Patient will be discharged home. He should follow-up with his primary care physician within one week of discharge. Patient will also need follow-up with Dr. Elnoria HowardHung, gastroenterologist within 2 weeks of discharge. Patient should also follow-up with general surgery. Patient to continue his medications as prescribed. Patient should follow a soft diet and advance to a heart healthy diet as tolerated.  Discharge Diagnoses:  Abdominal pain/enteritis  Hypokalemia Essential hypertension   Hyperlipidemia  Discharge Condition: Stable  Diet recommendation: Soft diet, advance slowly as tolerated to heart healthy  Filed Weights   11/03/14 2334 11/05/14 0500 11/05/14 2114  Weight: 55.112 kg (121 lb 8 oz) 55.974 kg (123 lb 6.4 oz) 58.877 kg (129 lb 12.8 oz)    History of present illness:  on 11/03/2014 by Dr. Lynden OxfordPranav Patel Erik BargeKien Kindall is a 74 y.o. male with Past medical history of hypertension. Patient presents with complaints of abdominal pain presentation since last one to 2 weeks. The pain is located in his umbilical area. The pain is really Endocrine. Patient denies any nausea vomiting diarrhea or constipation or active bleeding. Denies any fever but has some chills. Denies any chest pain shortness of breath cough. Denies any travel outside of status. Denies any recent change in his diet. Denies any recent change in his medication. With this complaint the patient was seen in the urgent care and had an CT scan ordered. A CT scan was showing entritis that the patient was sent here for further workup. The patient is coming from home. And at his baseline independent for most of his ADL.  Hospital Course:  Abdominal Pain/ Enteritis vs lymphoma -Patient continues to have  some abdominal pain, periumbilical and RLQ- however improved -CT abd/pelvis: Infectious/inflammatory enteritis, ?small bowel lymphoma, abnormal appendix -Leukocytosis improved -General surgery consulted and appreciated and recommended GI consult, elective appendectomy, continue antibiotic course, then repeat CT  -Initially placed on cipro and flagyl, IVF, pain control -Gastroenterology consulted and appreciated and recommended 10 days of antibiotics with outpatient follow up 2 weeks -Diet advanced and patient tolerated it well.  -He will need to continue PO Cipro and Flagyl and follow up with GI and Surgery   Hypokalemia -Possibly secondary to diuretics -Will replace and continue to monitor BMP  Essential Hypertension -Continue atenolol -Chlorthalidone held, but may be resumed at discharge  Hyperlipidemia -Statin was held due to NPO status however it seems the patient stopped his pravastatin due to symptoms. -Patient should follow-up with his primary care physician regarding his hyperlipidemia control.  Procedures  None  Consults  General surgery Gastroenterology  Discharge Exam: Filed Vitals:   11/06/14 0540  BP: 106/69  Pulse: 61  Temp: 98 F (36.7 C)  Resp: 18    Exam  General: Well developed, well nourished, NAD  HEENT: NCAT, mucous membranes moist.   Cardiovascular: S1 S2 auscultated, RRR  Respiratory: Clear to auscultation  Abdomen: Soft, RLQ TTP, nondistended, + bowel sounds  Neuro: AAOx3, nonfocal  Discharge Instructions      Discharge Instructions    Discharge instructions    Complete by:  As directed   Patient will be discharged home. He should follow-up with his primary care physician within one week of discharge. Patient will also need follow-up with Dr. Elnoria HowardHung, gastroenterologist within 2 weeks of discharge. Patient should also follow-up  with general surgery. Patient to continue his medications as prescribed. Patient should follow a soft diet  and advance to a heart healthy diet as tolerated.            Medication List    STOP taking these medications        sucralfate 1 G tablet  Commonly known as:  CARAFATE      TAKE these medications        atenolol-chlorthalidone 50-25 MG per tablet  Commonly known as:  TENORETIC  Take 1 tablet by mouth daily.     benzonatate 100 MG capsule  Commonly known as:  TESSALON  Take 1 capsule (100 mg total) by mouth 3 (three) times daily as needed for cough.     ciprofloxacin 500 MG tablet  Commonly known as:  CIPRO  Take 1 tablet (500 mg total) by mouth 2 (two) times daily.     metroNIDAZOLE 500 MG tablet  Commonly known as:  FLAGYL  Take 1 tablet (500 mg total) by mouth 3 (three) times daily.     pravastatin 20 MG tablet  Commonly known as:  PRAVACHOL  Take 1 tablet (20 mg total) by mouth daily.       Allergies  Allergen Reactions  . Lisinopril Cough   Follow-up Information    Follow up with HUNG,PATRICK D, MD. Schedule an appointment as soon as possible for a visit in 2 weeks.   Specialty:  Gastroenterology   Contact information:   [address] [place] [address] [phone]       Follow up with STEVENS, Lambert Keto, PA. Schedule an appointment as soon as possible for a visit in 1 week.   Specialty:  Physician Assistant   Why:  Hospital follow-up   Contact information:   [address] [place] [address] [phone]       Follow up with Ardeth Sportsman., MD. Schedule an appointment as soon as possible for a visit in 3 weeks.   Specialty:  General Surgery   Why:  Hospital follow up   Contact information:   [address] [place] [address] [phone]        The results of significant diagnostics from this hospitalization (including imaging, microbiology, ancillary and laboratory) are listed below for reference.    Significant Diagnostic Studies: Dg Abd 1 View  11/03/2014   CLINICAL DATA:  Followup  abdominal pain. Does not feel better. Stool is dark brown.  EXAM: ABDOMEN - 1 VIEW  COMPARISON:  None.  FINDINGS: Bowel gas pattern is nonobstructive. Surgical clips are identified within the pelvis. Visualized osseous structures have a normal appearance. No organomegaly.  IMPRESSION: Negative.   Electronically Signed   By: Norva Pavlov M.D.   On: 11/03/2014 13:47   Ct Abdomen Pelvis W Contrast  11/03/2014   CLINICAL DATA:  Epigastric abdominal pain, nausea x4 days, history of hernia repair  EXAM: CT ABDOMEN AND PELVIS WITH CONTRAST  TECHNIQUE: Multidetector CT imaging of the abdomen and pelvis was performed using the standard protocol following bolus administration of intravenous contrast.  CONTRAST:  80 mL Omnipaque 300 IV  COMPARISON:  None.  FINDINGS: Lower chest:  Lung bases are clear.  Hepatobiliary:  Liver is within normal limits.  Gallbladder is unremarkable. No intrahepatic or extrahepatic ductal dilatation.  Pancreas: Within normal limits.  Spleen: Within normal limits.  Adrenals/Urinary Tract: Adrenal glands are unremarkable.  5 mm cyst in the medial left upper kidney (series 5/image 8).  Right kidney is within normal limits. No hydronephrosis.  Bladder is mildly trabeculated but otherwise within normal limits.  Stomach/Bowel: Stomach is within normal limits.  No evidence of bowel obstruction.  Abnormal wall thickening involving a loop of ileum in the right mid abdomen (series 2/ images 50-59). Differential considerations include infectious/inflammatory enteritis or small bowel lymphoma.  Appendix is abnormally thickened, measuring up to 8 mm (series 2/image 55), with a 9 mm fluid density lesion at its tip (series 2/image 49). This appearance may reflect acute appendicitis, but is more suggestive of chronic inflammation. An underlying appendiceal lesion such as mucinous neoplasm, appendiceal mucocele, or less likely appendiceal carcinoid is also possible.  Vascular/Lymphatic: Ectasia of the  infrarenal abdominal aorta, measuring 2.6 x 2.7 cm just above the iliac bifurcation (series 2/image 45).  Atherosclerotic calcifications of the abdominal aorta and branch vessels.  No suspicious abdominopelvic lymphadenopathy.  Reproductive: Prostatomegaly.  Other: No abdominopelvic ascites.  Postsurgical changes related to bilateral inguinal hernia repair.  Musculoskeletal: Degenerative changes of the visualized thoracolumbar spine.  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  Abnormal appendix, favored to reflect chronic inflammation, less likely acute appendicitis. Underlying appendiceal lesion/neoplasm is not excluded given the presence of a low-density lesion at the tip of the appendix.  These results will be called to the ordering clinician or representative by the Radiology Department at the imaging location.   Electronically Signed   By: Charline Bills M.D.   On: 11/03/2014 17:10    Microbiology: No results found for this or any previous visit (from the past 240 hour(s)).   Labs: Basic Metabolic Panel:  Recent Labs Lab 11/01/14 1533 11/03/14 2050 11/04/14 0417 11/05/14 0500 11/06/14 0447  NA 136 137 138 140 140  K 4.4 3.7 3.3* 3.1* 4.1  CL 98 98 101 108 108  CO2 GLUCOSE 185* 130* 112* 93 113*  BUN 26* 24* CREATININE 1.28 1.17 1.12 1.20 1.09  CALCIUM 9.2 8.9 8.2* 8.3* 8.7   Liver Function Tests:  Recent Labs Lab 11/01/14 1533 11/03/14 2050 11/04/14 0417  AST ALT ALKPHOS 52 65 46  BILITOT 0.4 0.3 0.6  PROT 6.1 6.8 5.6*  ALBUMIN 3.7 3.6 2.9*    Recent Labs Lab 11/01/14 1533 11/03/14 2050  LIPASE <10 20  AMYLASE 37  --    No results for input(s): AMMONIA in the last 168 hours. CBC:  Recent Labs Lab 11/01/14 1510 11/03/14 1242 11/03/14 2050  11/04/14 0417 11/05/14 0500  WBC 13.8* 14.6* 12.1* 9.8 7.7  NEUTROABS  --   --  8.6*  --   --   HGB 14.5 14.6 13.8 12.8* 12.8*  HCT 45.3 45.6 41.5 38.9* 39.1  MCV 92.9 93.9 92.2 91.5 92.7  PLT  --   --  274 233 244   Cardiac Enzymes: No results for input(s): CKTOTAL, CKMB, CKMBINDEX, TROPONINI in the last 168 hours. BNP: BNP (last 3 results) No results for input(s): BNP in the last 8760 hours.  ProBNP (last 3 results) No results for input(s): PROBNP in the last 8760 hours.  CBG: No results for input(s): GLUCAP in the last 168 hours.     SignedEdsel Petrin  [facility] 11/06/2014, 8:52 AM

## 2014-11-06 NOTE — Discharge Instructions (Signed)
?  au b?ng °(Abdominal Pain) °Có nhi?u nguyên nhân d?n ??n ?au b?ng. Thông th??ng ?au b?ng là do m?t b?nh gây ra và s? không ?? n?u không ?i?u tr?. B?nh này có th? ???c theo dõi và ?i?u tr? t?i nhà. Chuyên gia ch?m sóc s?c kh?e s? ti?n hành khám th?c th? và có th? yêu c?u làm xét nghi?m máu và ch?p X quang ?? xác ??nh m?c ?? nghiêm tr?ng c?a c?n ?au b?ng. Tuy nhiên, trong nhi?u tr??ng h?p, ph?i m?t nhi?u th?i gian h?n ?? xác ??nh rõ nguyên nhân gây ?au b?ng. Tr??c khi tìm ra nguyên nhân, chuyên gia ch?m sóc s?c kh?e có th? không bi?t li?u quý v? có c?n làm thêm xét nghi?m ho?c ti?p t?c ?i?u tr? hay không. °H??NG D?N CH?M SÓC T?I NHÀ  °Theo dõi c?n ?au b?ng xem có b?t k? thay ??i nào không. Nh?ng hành ??ng sau có th? giúp lo?i b? b?t c? c?m giác khó ch?u nào quý v? ?ang b?. °· Ch? s? d?ng thu?c không c?n kê ??n ho?c thu?c c?n kê ??n theo ch? d?n c?a chuyên gia ch?m sóc s?c kh?e. °· Không dùng thu?c nhu?n tràng tr? khi ???c chuyên gia ch?m sóc s?c kh?e ch? ??nh. °· Th? dùng m?t ch? ?? ?n l?ng (n??c lu?c th?t, trà, ho?c n??c) theo ch? d?n c?a chuyên gia ch?m sóc s?c kh?e. Chuy?n d?n sang m?t ch? ?? ?n nh? n?u ch?u ???c. °?I KHÁM N?U: °· Quý v? b? ?au b?ng không rõ nguyên nhân. °· Quý v? b? ?au b?ng kèm theo bu?n nôn ho?c tiêu ch?y. °· Quý v? b? ?au khi ?i ti?u ho?c ?i ngoài. °· Quý v? b? c?n ?au b?ng làm th?c gi?c vào ban ?êm. °· Quý v? b? ?au b?ng n?ng thêm ho?c ?? h?n khi ?n. °· Quý v? b? ?au b?ng n?ng thêm khi ?n ?? ?n nhi?u ch?t béo. °· Quý v? b? s?t. °NGAY L?P T?C ?I KHÁM N?U:  °· C?n ?au không kh?i trong vòng 2 gi?. °· Quý v? v?n ti?p t?c b? ói (nôn m?a). °· Ch? có th? c?m th?y ?au ? m?t s? ph?n b?ng, ch?ng h?n nh? ? ph?n b?ng bên ph?i ho?c bên d??i trái. °· Phân c?a quý v? có máu ho?c có màu ?en nh? h?c ín. °??M B?O QUÝ V?: °· Hi?u rõ các h??ng d?n này. °· S? theo dõi tình tr?ng c?a mình. °· S? yêu c?u tr? giúp ngay l?p t?c n?u quý v? c?m th?y không kh?e ho?c th?y tr?m tr?ng h?n. °Document Released: 08/24/2005  Document Revised: 08/29/2013 °[employer]® Patient Information ©2015 [employer]. This information is not intended to replace advice given to you by your health care provider. Make sure you discuss any questions you have with your health care provider. ° °

## 2014-11-26 ENCOUNTER — Ambulatory Visit (HOSPITAL_COMMUNITY)
Admission: RE | Admit: 2014-11-26 | Discharge: 2014-11-26 | Disposition: A | Discharge status: Home / Self Care | Payer: Commercial Managed Care - HMO | Source: Ambulatory Visit | Attending: Family Medicine | Admitting: Family Medicine

## 2014-11-26 ENCOUNTER — Ambulatory Visit (INDEPENDENT_AMBULATORY_CARE_PROVIDER_SITE_OTHER): Payer: Commercial Managed Care - HMO | Admitting: Family Medicine

## 2014-11-26 ENCOUNTER — Encounter (HOSPITAL_COMMUNITY): Payer: Self-pay

## 2014-11-26 ENCOUNTER — Inpatient Hospital Stay (HOSPITAL_COMMUNITY)
Admission: EM | Admit: 2014-11-26 | Discharge: 2014-11-28 | DRG: 343 | Disposition: A | Discharge status: Home / Self Care | Payer: Commercial Managed Care - HMO | Attending: Surgery | Admitting: Surgery

## 2014-11-26 VITALS — BP 116/64 | HR 72 | Temp 98.0°F | Resp 18 | Ht 66.5 in | Wt 123.0 lb

## 2014-11-26 DIAGNOSIS — K4090 Unilateral inguinal hernia, without obstruction or gangrene, not specified as recurrent: Secondary | ICD-10-CM | POA: Diagnosis present

## 2014-11-26 DIAGNOSIS — I10 Essential (primary) hypertension: Secondary | ICD-10-CM | POA: Diagnosis present

## 2014-11-26 DIAGNOSIS — Z888 Allergy status to other drugs, medicaments and biological substances status: Secondary | ICD-10-CM

## 2014-11-26 DIAGNOSIS — I152 Hypertension secondary to endocrine disorders: Secondary | ICD-10-CM | POA: Diagnosis present

## 2014-11-26 DIAGNOSIS — E785 Hyperlipidemia, unspecified: Secondary | ICD-10-CM | POA: Diagnosis present

## 2014-11-26 DIAGNOSIS — R1031 Right lower quadrant pain: Secondary | ICD-10-CM

## 2014-11-26 DIAGNOSIS — E1159 Type 2 diabetes mellitus with other circulatory complications: Secondary | ICD-10-CM | POA: Diagnosis present

## 2014-11-26 DIAGNOSIS — R1013 Epigastric pain: Secondary | ICD-10-CM

## 2014-11-26 DIAGNOSIS — Z825 Family history of asthma and other chronic lower respiratory diseases: Secondary | ICD-10-CM | POA: Diagnosis not present

## 2014-11-26 DIAGNOSIS — R1030 Lower abdominal pain, unspecified: Secondary | ICD-10-CM | POA: Diagnosis present

## 2014-11-26 DIAGNOSIS — Z808 Family history of malignant neoplasm of other organs or systems: Secondary | ICD-10-CM | POA: Diagnosis not present

## 2014-11-26 DIAGNOSIS — K3580 Unspecified acute appendicitis: Secondary | ICD-10-CM

## 2014-11-26 DIAGNOSIS — K3589 Other acute appendicitis without perforation or gangrene: Secondary | ICD-10-CM

## 2014-11-26 DIAGNOSIS — E119 Type 2 diabetes mellitus without complications: Secondary | ICD-10-CM | POA: Diagnosis present

## 2014-11-26 HISTORY — DX: Crohn's disease of small intestine without complications: K50.00

## 2014-11-26 HISTORY — DX: Unspecified acute appendicitis: K35.80

## 2014-11-26 LAB — POCT CBC
Granulocyte percent: 56.5 %G (ref 37–80)
HCT, POC: 43.4 % — AB (ref 43.5–53.7)
Hemoglobin: 13.8 g/dL — AB (ref 14.1–18.1)
Lymph, poc: 2.5 (ref 0.6–3.4)
MCH: 29.8 pg (ref 27–31.2)
MCHC: 31.7 g/dL — AB (ref 31.8–35.4)
MCV: 94 fL (ref 80–97)
MID (cbc): 0.5 (ref 0–0.9)
MPV: 6.7 fL (ref 0–99.8)
POC Granulocyte: 3.8 (ref 2–6.9)
POC LYMPH %: 36.7 % (ref 10–50)
POC MID %: 6.8 %M (ref 0–12)
Platelet Count, POC: 351 10*3/uL (ref 142–424)
RBC: 4.62 M/uL — AB (ref 4.69–6.13)
RDW, POC: 15.1 %
WBC: 6.7 10*3/uL (ref 4.6–10.2)

## 2014-11-26 LAB — CBC WITH DIFFERENTIAL/PLATELET
BASOS PCT: 0 % (ref 0–1)
Basophils Absolute: 0 10*3/uL (ref 0.0–0.1)
Eosinophils Absolute: 0.1 10*3/uL (ref 0.0–0.7)
Eosinophils Relative: 2 % (ref 0–5)
HEMATOCRIT: 42.7 % (ref 39.0–52.0)
Hemoglobin: 14.2 g/dL (ref 13.0–17.0)
LYMPHS ABS: 2.3 10*3/uL (ref 0.7–4.0)
Lymphocytes Relative: 40 % (ref 12–46)
MCH: 30.6 pg (ref 26.0–34.0)
MCHC: 33.3 g/dL (ref 30.0–36.0)
MCV: 92 fL (ref 78.0–100.0)
Monocytes Absolute: 0.5 10*3/uL (ref 0.1–1.0)
Monocytes Relative: 8 % (ref 3–12)
NEUTROS ABS: 2.9 10*3/uL (ref 1.7–7.7)
Neutrophils Relative %: 50 % (ref 43–77)
PLATELETS: 299 10*3/uL (ref 150–400)
RBC: 4.64 MIL/uL (ref 4.22–5.81)
RDW: 13.9 % (ref 11.5–15.5)
WBC: 5.8 10*3/uL (ref 4.0–10.5)

## 2014-11-26 LAB — URINALYSIS, ROUTINE W REFLEX MICROSCOPIC
BILIRUBIN URINE: NEGATIVE
Glucose, UA: 100 mg/dL — AB
Hgb urine dipstick: NEGATIVE
KETONES UR: NEGATIVE mg/dL
Leukocytes, UA: NEGATIVE
NITRITE: NEGATIVE
PROTEIN: NEGATIVE mg/dL
Specific Gravity, Urine: 1.039 — ABNORMAL HIGH (ref 1.005–1.030)
UROBILINOGEN UA: 0.2 mg/dL (ref 0.0–1.0)
pH: 8 (ref 5.0–8.0)

## 2014-11-26 LAB — BASIC METABOLIC PANEL
Anion gap: 7 (ref 5–15)
BUN: 18 mg/dL (ref 6–23)
CHLORIDE: 102 mmol/L (ref 96–112)
CO2: 29 mmol/L (ref 19–32)
CREATININE: 0.99 mg/dL (ref 0.50–1.35)
Calcium: 9.1 mg/dL (ref 8.4–10.5)
GFR calc Af Amer: >90 mL/min (ref >90)
GFR calc non Af Amer: 79 mL/min — ABNORMAL LOW (ref >90)
Glucose, Bld: 78 mg/dL (ref 70–99)
Potassium: 3.9 mmol/L (ref 3.5–5.1)
Sodium: 138 mmol/L (ref 135–145)

## 2014-11-26 MED ORDER — IOHEXOL 300 MG/ML  SOLN
100.0000 mL | Freq: Once | INTRAMUSCULAR | Status: AC | PRN
  Administered 2014-11-26: 100 mL via INTRAVENOUS

## 2014-11-26 MED ORDER — ONDANSETRON HCL 4 MG/2ML IJ SOLN: 4.0000 mg | Freq: Four times a day (QID) | INTRAMUSCULAR | Status: DC | PRN

## 2014-11-26 MED ORDER — SODIUM CHLORIDE 0.9 % IV SOLN
INTRAVENOUS | Status: DC
  Administered 2014-11-26: 23:00:00 via INTRAVENOUS

## 2014-11-26 MED ORDER — SODIUM CHLORIDE 0.9 % IV SOLN
INTRAVENOUS | Status: DC
  Administered 2014-11-27: 22:00:00 via INTRAVENOUS

## 2014-11-26 MED ORDER — MORPHINE SULFATE 2 MG/ML IJ SOLN
1.0000 mg | INTRAMUSCULAR | Status: DC | PRN
  Administered 2014-11-27: 4 mg via INTRAVENOUS
  Administered 2014-11-27 (×2): 1 mg via INTRAVENOUS
  Filled 2014-11-26: qty 1
  Filled 2014-11-26: qty 2
  Filled 2014-11-26: qty 1

## 2014-11-26 MED ORDER — ENOXAPARIN SODIUM 40 MG/0.4ML ~~LOC~~ SOLN
40.0000 mg | SUBCUTANEOUS | Status: DC
  Administered 2014-11-26: 40 mg via SUBCUTANEOUS
  Filled 2014-11-26 (×2): qty 0.4

## 2014-11-26 MED ORDER — PANTOPRAZOLE SODIUM 40 MG IV SOLR
40.0000 mg | Freq: Every day | INTRAVENOUS | Status: DC
  Administered 2014-11-27 (×2): 40 mg via INTRAVENOUS
  Filled 2014-11-26 (×3): qty 40

## 2014-11-26 MED ORDER — IOHEXOL 300 MG/ML  SOLN
50.0000 mL | Freq: Once | INTRAMUSCULAR | Status: AC | PRN
  Administered 2014-11-26: 50 mL via ORAL

## 2014-11-26 MED ORDER — PIPERACILLIN-TAZOBACTAM 3.375 G IVPB
3.3750 g | Freq: Three times a day (TID) | INTRAVENOUS | Status: DC
  Administered 2014-11-27 – 2014-11-28 (×5): 3.375 g via INTRAVENOUS
  Filled 2014-11-26 (×8): qty 50

## 2014-11-26 NOTE — H&P (Addendum)
Erik Aguirre is an 74 y.o. male.   Chief Complaint: Lower abdominal pain HPI: this is a 74 year old male with a 3 day history of persistent, mild lower abdominal pain. No fever or chills. No nausea or vomiting. Bowels are moving well. He is eating well. He presented to Dr. Patsy Lageropland at urgent care. His white blood cell count was normal.  CT scan was performed. He appeared to have more inflammatory changes around the tip of the appendix and then the prior CT scan which was done on 11/03/2014. He was admitted at that time with a diagnosis of terminal ileitis and possible appendicitis/mucocele. His appendix was dilated at that time. He responded to antibiotics. He was discharged and was to have a planned follow-up with Dr. Michaell CowingGross  to schedule elective appendectomy. He finished his antibiotics 10 days ago and then the pain started 3 days ago as above. We were asked to see him because of the return of his pain and the new CT scan findings.  Past Medical History  Diagnosis Date  . Hypertension   . Hyperlipidemia     History reviewed. No pertinent past surgical history.  Family History  Problem Relation Age of Onset  . Throat cancer Brother   . Asthma Son    Social History:  reports that he has never smoked. He has never used smokeless tobacco. He reports that he does not drink alcohol or use illicit drugs.  Allergies:  Allergies  Allergen Reactions  . Lisinopril Cough     (Not in a hospital admission)  Results for orders placed or performed during the hospital encounter of 11/26/14 (from the past 48 hour(s))  Urinalysis, Routine w reflex microscopic     Status: Abnormal   Collection Time: 11/26/14  9:30 PM  Result Value Ref Range   Color, Urine YELLOW YELLOW   APPearance CLEAR CLEAR   Specific Gravity, Urine 1.039 (H) 1.005 - 1.030   pH 8.0 5.0 - 8.0   Glucose, UA 100 (A) NEGATIVE mg/dL   Hgb urine dipstick NEGATIVE NEGATIVE   Bilirubin Urine NEGATIVE NEGATIVE   Ketones, ur NEGATIVE  NEGATIVE mg/dL   Protein, ur NEGATIVE NEGATIVE mg/dL   Urobilinogen, UA 0.2 0.0 - 1.0 mg/dL   Nitrite NEGATIVE NEGATIVE   Leukocytes, UA NEGATIVE NEGATIVE    Comment: MICROSCOPIC NOT DONE ON URINES WITH NEGATIVE PROTEIN, BLOOD, LEUKOCYTES, NITRITE, OR GLUCOSE <1000 mg/dL.   No results found.  Review of Systems  Constitutional: Negative for fever, chills and malaise/fatigue.  Respiratory: Negative for shortness of breath.   Cardiovascular: Negative for chest pain.  Gastrointestinal: Positive for abdominal pain. Negative for nausea, vomiting and diarrhea.  Genitourinary: Negative for dysuria.    Blood pressure 183/109, pulse 71, temperature 97.9 F (36.6 C), temperature source Oral, resp. rate 20, SpO2 100 %. Physical Exam  Constitutional: He appears well-developed and well-nourished. No distress.  HENT:  Head: Normocephalic and atraumatic.  Eyes: No scleral icterus.  Cardiovascular: Normal rate and regular rhythm.   Respiratory: Effort normal and breath sounds normal.  GI: Soft. Bowel sounds are normal. There is tenderness (mild tenderness in lower abdominal areas with no guarding or mass).  Musculoskeletal: He exhibits no edema.  Neurological: He is alert.  Skin: Skin is warm and dry.  Psychiatric: He has a normal mood and affect. His behavior is normal.     Assessment/Plan 1. Recurrent appendicitis  2. Uncontrolled hypertension  3.  Recent h/o terminal ileitis of unknown etiology.  Plan: Admit to the hospital.  IV antibiotics. Medical consult to treat the hypertension. Will likely need appendectomy this admission, possibly tomorrow. We will reexamine him and discussed with him in the morning.  Paymon Rosensteel J 11/26/2014, 10:07 PM

## 2014-11-26 NOTE — Consult Note (Signed)
[facility] Consult Note  Erik Aguirre ZOX:[id number] DOB: [birth year] DOA: 11/26/2014  Referring physician: Avel Peace, MD PCP: Dow Adolph, [place]   Chief Complaint: Hypertension  HPI: Erik Aguirre is a 74 y.o. male presents with abdominal pain. Patient presented to the ED with lower abdominal pain. He states that pain has been going on for three days. He states that he has not been taking his medications for his blood pressure. As a result his pressure is elevated. Patient is normally on tenoretic for his blood pressure. He has no headache no chest pain noted. No palpitations noted either. Patient has no vomiting or nausea. He has actually been eating well and has been having normal bowel movements. The plan is to possibly take him to surgery in AM and needs his pressure to be under control.   Review of Systems:  Complete 12 point ROS is negative other than noted above in HPI   Past Medical History  Diagnosis Date  . Hypertension   . Hyperlipidemia    History reviewed. No pertinent past surgical history. Social History:  reports that he has never smoked. He has never used smokeless tobacco. He reports that he does not drink alcohol or use illicit drugs.  Allergies  Allergen Reactions  . Lisinopril Cough    Family History  Problem Relation Age of Onset  . Throat cancer Brother   . Asthma Son      Prior to Admission medications   Medication Sig Start Date End Date Taking? Authorizing Provider  atenolol-chlorthalidone (TENORETIC) 50-25 MG per tablet Take 1 tablet by mouth daily. 12/20/13  Yes Gwenlyn Found Copland, MD  benzonatate (TESSALON) 100 MG capsule Take 1 capsule (100 mg total) by mouth 3 (three) times daily as needed for cough. Patient not taking: Reported on 11/03/2014 01/09/14   Pearline Cables, MD  metroNIDAZOLE (FLAGYL) 500 MG tablet Take 1 tablet (500 mg total) by mouth 3 (three) times daily. Patient not taking: Reported on 11/26/2014 11/06/14   Nita Sells  Mikhail, DO  pravastatin (PRAVACHOL) 20 MG tablet Take 1 tablet (20 mg total) by mouth daily. Patient not taking: Reported on 11/26/2014 07/03/14   Pearline Cables, MD   Physical Exam: Filed Vitals:   11/26/14 2131  BP: 183/109  Pulse: 71  Temp: 97.9 F (36.6 C)  TempSrc: Oral  Resp: 20  SpO2: 100%    Wt Readings from Last 3 Encounters:  11/26/14 55.792 kg (123 lb)  11/05/14 58.877 kg (129 lb 12.8 oz)  11/03/14 56.246 kg (124 lb)    General:  Appears calm and comfortable Eyes: PERRL, normal lids, irises & conjunctiva ENT: grossly normal hearing, lips & tongue Neck: no LAD, masses or thyromegaly Cardiovascular: RRR, no m/r/g. No LE edema. Respiratory: CTA bilaterally, no w/r/r. Normal respiratory effort. Abdomen: soft, ntnd Skin: no rash or induration seen on limited exam Musculoskeletal: grossly normal tone BUE/BLE Psychiatric: grossly normal mood and affect, speech fluent and appropriate Neurologic: grossly non-focal.          Labs on Admission:  Basic Metabolic Panel: No results for input(s): NA, K, CL, CO2, GLUCOSE, BUN, CREATININE, CALCIUM, MG, PHOS in the last 168 hours. Liver Function Tests: No results for input(s): AST, ALT, ALKPHOS, BILITOT, PROT, ALBUMIN in the last 168 hours. No results for input(s): LIPASE, AMYLASE in the last 168 hours. No results for input(s): AMMONIA in the last 168 hours. CBC:  Recent Labs Lab 11/26/14 1701 11/26/14 2202  WBC 6.7 5.8  NEUTROABS  --  2.9  HGB 13.8* 14.2  HCT 43.4* 42.7  MCV 94.0 92.0  PLT  --  299   Cardiac Enzymes: No results for input(s): CKTOTAL, CKMB, CKMBINDEX, TROPONINI in the last 168 hours.  BNP (last 3 results) No results for input(s): BNP in the last 8760 hours.  ProBNP (last 3 results) No results for input(s): PROBNP in the last 8760 hours.  CBG: No results for input(s): GLUCAP in the last 168 hours.  Radiological Exams on Admission: Ct Abdomen Pelvis W Contrast  11/26/2014   CLINICAL  DATA:  Worsening subacute epigastric abdominal pain. Initial encounter.  EXAM: CT ABDOMEN AND PELVIS WITH CONTRAST  TECHNIQUE: Multidetector CT imaging of the abdomen and pelvis was performed using the standard protocol following bolus administration of intravenous contrast.  CONTRAST:  50mL OMNIPAQUE IOHEXOL 300 MG/ML SOLN, OMNIPAQUE IOHEXOL 300 MG/ML SOLN  COMPARISON:  CT of the abdomen and pelvis from 11/03/2014  FINDINGS: The visualized lung bases are clear.  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 The spleen is otherwise unremarkable. The liver is within normal limits. The gallbladder is within normal limits. The pancreas and adrenal glands are unremarkable.  The kidneys are unremarkable in appearance. There is no evidence of hydronephrosis. No renal or ureteral stones are seen. No perinephric stranding is appreciated.  No free fluid is identified. The previously noted inflamed loop of mid ileum has resolved. The visualized small bowel is partially filled with contrast and is unremarkable in appearance. The stomach is within normal limits. No acute vascular abnormalities are seen. 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 The aneurysmal portion measures 3.3 cm in transverse dimension and 2.5 cm in AP dimension. 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 There is mild ectasia of the left common iliac artery.  The appendix is dilated, slightly more prominent than on the prior study, extending towards the midline, measuring up to 1.1 cm in diameter. Surrounding soft tissue inflammation is mildly increased, extending along the mesentery. This is concerning for mildly worsening relatively chronic appendicitis. A few mildly prominent adjacent nodes are seen.  The bladder is mildly  distended and grossly unremarkable. The prostate remains normal in size, though centrally increased enhancement is again noted. Would correlate with PSA, to assess for underlying mass, and consider further evaluation as deemed clinically appropriate. No inguinal lymphadenopathy is seen.  No acute osseous abnormalities are identified.  IMPRESSION: 1. Mildly worsened appendicitis noted, with dilatation of the appendix to 1.1 cm in diameter, and mildly increased surrounding soft tissue inflammation, extending along the mesentery. Few mildly prominent adjacent lymph nodes seen. No evidence of perforation or abscess formation. Given that mild appendicitis was noted on the CT from February, this appears to reflect gradually worsening somewhat chronic appendicitis. 2. Previously noted mid ileal bowel inflammation has resolved; this likely reflected an acute infectious or inflammatory ileal process. The distal ileum is unremarkable in appearance; there is no evidence of terminal ileitis. 3. 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 This measures up to 3.3 cm in transverse dimension. Mild associated mural thrombus noted. Recommend followup by ultrasound in 3 years. This recommendation follows ACR consensus guidelines: White Paper of the ACR Incidental Findings Committee II on Vascular Findings. J  Am Coll Radiol 2013; 10:789-794 4. 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 Spleen otherwise unremarkable appearance. 5. Centrally increased prostatic enhancement again noted, though the prostate remains normal in size. Would correlate with PSA, to assess for underlying mass, and consider further evaluation as deemed clinically appropriate.  These results were called by telephone at the time of interpretation on 11/26/2014 at 10:23 pm to [facility], who verbally acknowledged these results.   Electronically Signed   By: Roanna RaiderJeffery  Chang M.D.   On: 11/26/2014 22:23     Assessment/Plan Active Problems:   HTN (hypertension)   Appendicitis   1. Hypertension -would start now on clonidine 0.1mg  po TID with sips of water -would monitor pressures closely overnight -titrate medications according to pressure readings  2. Appendicitis -per surgery to possibly go for surgery in am    Code Status: Full Code (must indicate code status--if unknown or must be presumed, indicate so) DVT Prophylaxis:SCD Family Communication: Wife (indicate person spoken with, if applicable, with phone number if by telephone) Disposition Plan: Home (indicate anticipated LOS)  Time spent: 60min  [facility],Susannah Carbin A [facility] Pager [phone]

## 2014-11-26 NOTE — ED Provider Notes (Signed)
CSN: [id number]     Arrival date & time 11/26/14  2119 History   First MD Initiated Contact with Patient 11/26/14 2127     Chief Complaint  Patient presents with  . Abdominal Pain     (Consider location/radiation/quality/duration/timing/severity/associated sxs/prior Treatment) HPI Comments: Patient here complaining of right lower quadrant abdominal pain 2 days. Patient seem has Dr. prior to arrival and had an outpatient abdominal CT which was positive for appendicitis. This report was called to me. He denies fever or chills. No treatment use prior to arrival  Patient is a 74 y.o. male presenting with abdominal pain. The history is provided by the patient.  Abdominal Pain   Past Medical History  Diagnosis Date  . Hypertension   . Hyperlipidemia    History reviewed. No pertinent past surgical history. Family History  Problem Relation Age of Onset  . Throat cancer Brother   . Asthma Son    History  Substance Use Topics  . Smoking status: Never Smoker   . Smokeless tobacco: Never Used  . Alcohol Use: No    Review of Systems  Gastrointestinal: Positive for abdominal pain.  All other systems reviewed and are negative.     Allergies  Lisinopril  Home Medications   Prior to Admission medications   Medication Sig Start Date End Date Taking? Authorizing Provider  atenolol-chlorthalidone (TENORETIC) 50-25 MG per tablet Take 1 tablet by mouth daily. 12/20/13  Yes Gwenlyn Found Copland, MD  benzonatate (TESSALON) 100 MG capsule Take 1 capsule (100 mg total) by mouth 3 (three) times daily as needed for cough. Patient not taking: Reported on 11/03/2014 01/09/14   Pearline Cables, MD  metroNIDAZOLE (FLAGYL) 500 MG tablet Take 1 tablet (500 mg total) by mouth 3 (three) times daily. Patient not taking: Reported on 11/26/2014 11/06/14   Nita Sells Mikhail, DO  pravastatin (PRAVACHOL) 20 MG tablet Take 1 tablet (20 mg total) by mouth daily. Patient not taking: Reported on 11/26/2014 07/03/14    Gwenlyn Found Copland, MD   BP 183/109 mmHg  Pulse 71  Temp(Src) 97.9 F (36.6 C) (Oral)  Resp 20  SpO2 100% Physical Exam  Constitutional: He is oriented to person, place, and time. He appears well-developed and well-nourished.  Non-toxic appearance. No distress.  HENT:  Head: Normocephalic and atraumatic.  Eyes: Conjunctivae, EOM and lids are normal. Pupils are equal, round, and reactive to light.  Neck: Normal range of motion. Neck supple. No tracheal deviation present. No thyroid mass present.  Cardiovascular: Normal rate, regular rhythm and normal heart sounds.  Exam reveals no gallop.   No murmur heard. Pulmonary/Chest: Effort normal and breath sounds normal. No stridor. No respiratory distress. He has no decreased breath sounds. He has no wheezes. He has no rhonchi. He has no rales.  Abdominal: Soft. Normal appearance and bowel sounds are normal. He exhibits no distension. There is tenderness in the right lower quadrant. There is positive Murphy's sign. There is no rigidity, no rebound, no guarding and no CVA tenderness.    Musculoskeletal: Normal range of motion. He exhibits no edema or tenderness.  Neurological: He is alert and oriented to person, place, and time. He has normal strength. No cranial nerve deficit or sensory deficit. GCS eye subscore is 4. GCS verbal subscore is 5. GCS motor subscore is 6.  Skin: Skin is warm and dry. No abrasion and no rash noted.  Psychiatric: He has a normal mood and affect. His speech is normal and behavior is normal.  Nursing  note and vitals reviewed.   ED Course  Procedures (including critical care time) Labs Review Labs Reviewed  CBC WITH DIFFERENTIAL/PLATELET  BASIC METABOLIC PANEL  URINALYSIS, ROUTINE W REFLEX MICROSCOPIC    Imaging Review No results found.   EKG Interpretation None      MDM   Final diagnoses:  None    Will consult general surgery for admission    Lorre NickAnthony Markeisha Mancias, MD 11/26/14 2152

## 2014-11-26 NOTE — ED Notes (Signed)
Pt is a known appendicitis from CT, Dr Johna SheriffHoxworth is coming to see this patient.

## 2014-11-26 NOTE — Progress Notes (Signed)
[facility] [address], [place] [id number] [phone]  Date:  11/26/2014   Name:  Erik Aguirre   DOB:  [birth year]   MRN:  [id number]  PCP:  Dow AdolphSTEVENS, LISA LAJUANA, PA    Chief Complaint: Follow-up and Abdominal Pain   History of Present Illness:  Erik Aguirre is a 74 y.o. very pleasant male patient who presents with the following:  He was seen here on 2/25 with epigastric pain; he followed up on 2/27 and was worse, he was admitted to the hospital from 2/27 to 11/06/14 with enteritis and appendiceal inflammation.  His CT showed infectious vs inflammatory enteritis vs possible lymphoma.  He improved rapidly with abx and was discharged with plans for GI follow-up and elevtive appendectomy.    He states that he still has some pain, altough he is much better than he was.  He completed his PO flagyl and cipro. He did feel that he was continuing to get better, but over the last 3 days he has been getting worse again.   No vomiting, no diarrhea.  He is eating, sleeping and having stools normally.    He has not yet followed up with Dr. Elnoria HowardHung or with general surgery.  Apparently he tried to schedule with Dr. Haywood PaoHung's office but was told that they did not accept his insurance so he was not sure what to do next.  There is some language barrier that my limit his ability to navigate the health care system.    Patient Active Problem List   Diagnosis Date Noted  . 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 Abdominal pain 11/03/2014  . Enteritis 11/03/2014  . Dyslipidemia 11/01/2014  . HTN (hypertension) 07/02/2014  . Pre-diabetes 07/02/2014  . 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 Upper airway cough syndrome 08/11/2013    Past Medical History  Diagnosis Date  . Hypertension   . Hyperlipidemia     History reviewed. No pertinent past surgical history.  History  Substance Use Topics  . Smoking status: Never Smoker   . Smokeless tobacco: Never Used  . Alcohol Use: No    Family  History  Problem Relation Age of Onset  . Throat cancer Brother   . Asthma Son     Allergies  Allergen Reactions  . Lisinopril Cough    Medication list has been reviewed and updated.  Current Outpatient Prescriptions on File Prior to Visit  Medication Sig Dispense Refill  . atenolol-chlorthalidone (TENORETIC) 50-25 MG per tablet Take 1 tablet by mouth daily. 90 tablet 3  . pravastatin (PRAVACHOL) 20 MG tablet Take 1 tablet (20 mg total) by mouth daily. 90 tablet 3  . benzonatate (TESSALON) 100 MG capsule Take 1 capsule (100 mg total) by mouth 3 (three) times daily as needed for cough. (Patient not taking: Reported on 11/03/2014) 40 capsule 0  . metroNIDAZOLE (FLAGYL) 500 MG tablet Take 1 tablet (500 mg total) by mouth 3 (three) times daily. (Patient not taking: Reported on 11/26/2014) 24 tablet 0   No current facility-administered medications on file prior to visit.    Review of Systems:  As per HPI- otherwise negative.   Physical Examination: Filed Vitals:   11/26/14 1527  BP: 116/64  Pulse: 72  Temp: 98 F (36.7 C)  Resp: 18   Filed Vitals:   11/26/14 1527  Height: 5' 6.5" (1.689 m)  Weight: 123 lb (55.792 kg)   Body mass index is 19.56 kg/(m^2). Ideal Body Weight: Weight in (lb) to have  BMI = 25: 156.9  GEN: WDWN, NAD, Non-toxic, A & O x 3, thin man who appears well HEENT: Atraumatic, Normocephalic. Neck supple. No masses, No LAD. Ears and Nose: No external deformity. CV: RRR, No M/G/R. No JVD. No thrill. No extra heart sounds. PULM: CTA B, no wheezes, crackles, rhonchi. No retractions. No resp. distress. No accessory muscle use. ABD: S, ND, +BS. No rebound. No HSM.  He has tenderness in the epigastrium and the RLQ.  No guarding.   EXTR: No c/c/e NEURO Normal gait.  PSYCH: Normally interactive. Conversant. Not depressed or anxious appearing.  Calm demeanor.   Results for orders placed or performed in visit on 11/26/14  POCT CBC  Result Value Ref Range   WBC  6.7 4.6 - 10.2 K/uL   Lymph, poc 2.5 0.6 - 3.4   POC LYMPH PERCENT 36.7 10 - 50 %L   MID (cbc) 0.5 0 - 0.9   POC MID % 6.8 0 - 12 %M   POC Granulocyte 3.8 2 - 6.9   Granulocyte percent 56.5 37 - 80 %G   RBC 4.62 (A) 4.69 - 6.13 M/uL   Hemoglobin 13.8 (A) 14.1 - 18.1 g/dL   HCT, POC 47.8 (A) 29.5 - 53.7 %   MCV 94.0 80 - 97 fL   MCH, POC 29.8 27 - 31.2 pg   MCHC 31.7 (A) 31.8 - 35.4 g/dL   RDW, POC 62.1 %   Platelet Count, POC 351 142 - 424 K/uL   MPV 6.7 0 - 99.8 fL    Assessment and Plan: Abdominal pain, epigastric - Plan: POCT CBC, Comprehensive metabolic panel, CT Abdomen Pelvis W Contrast  RLQ abdominal pain - Plan: POCT CBC, Comprehensive metabolic panel, CT Abdomen Pelvis W Contrast  Here today with recurrent abd pain.  Spoke with general surgeon on call who recommended that we repeat his CT scan to make sure he does not now have acute appendicitis.  CT scan did show worsening of his appendiceal findings. Dr. Abbey Chatters kindly spoke with me and recommended that he be admitted tonight for appendectomy in the am.  Called pt on his cell phone to relay this information, and asked [facility] radiology staff member to escort him to the ER.  Called and alerted EDP, confirmed with charge nurse that pt had arrived in the ED.   Signed Abbe Amsterdam, MD

## 2014-11-26 NOTE — Patient Instructions (Signed)
[facility] CT. DO NOT register as ED patient.

## 2014-11-27 ENCOUNTER — Inpatient Hospital Stay (HOSPITAL_COMMUNITY): Payer: Commercial Managed Care - HMO | Admitting: Anesthesiology

## 2014-11-27 ENCOUNTER — Encounter (HOSPITAL_COMMUNITY): Admission: EM | Disposition: A | Discharge status: Home / Self Care | Payer: Self-pay | Source: Home / Self Care

## 2014-11-27 ENCOUNTER — Encounter (HOSPITAL_COMMUNITY): Payer: Self-pay

## 2014-11-27 HISTORY — PX: LAPAROSCOPIC APPENDECTOMY: SHX408

## 2014-11-27 LAB — SURGICAL PCR SCREEN
MRSA, PCR: NEGATIVE
Staphylococcus aureus: NEGATIVE

## 2014-11-27 LAB — CBC
HEMATOCRIT: 38.8 % — AB (ref 39.0–52.0)
HEMOGLOBIN: 12.8 g/dL — AB (ref 13.0–17.0)
MCH: 30.7 pg (ref 26.0–34.0)
MCHC: 33 g/dL (ref 30.0–36.0)
MCV: 93 fL (ref 78.0–100.0)
PLATELETS: 280 10*3/uL (ref 150–400)
RBC: 4.17 MIL/uL — ABNORMAL LOW (ref 4.22–5.81)
RDW: 14 % (ref 11.5–15.5)
WBC: 5.3 10*3/uL (ref 4.0–10.5)

## 2014-11-27 LAB — COMPREHENSIVE METABOLIC PANEL
ALK PHOS: 41 U/L (ref 39–117)
ALT: 13 U/L (ref 0–53)
AST: 15 U/L (ref 0–37)
Albumin: 3.8 g/dL (ref 3.5–5.2)
BILIRUBIN TOTAL: 0.4 mg/dL (ref 0.2–1.2)
BUN: 20 mg/dL (ref 6–23)
CO2: 31 meq/L (ref 19–32)
Calcium: 9.4 mg/dL (ref 8.4–10.5)
Chloride: 102 mEq/L (ref 96–112)
Creat: 0.97 mg/dL (ref 0.50–1.35)
GLUCOSE: 101 mg/dL — AB (ref 70–99)
POTASSIUM: 4.5 meq/L (ref 3.5–5.3)
SODIUM: 141 meq/L (ref 135–145)
TOTAL PROTEIN: 6.6 g/dL (ref 6.0–8.3)

## 2014-11-27 SURGERY — APPENDECTOMY, LAPAROSCOPIC
Anesthesia: General | Site: Abdomen

## 2014-11-27 MED ORDER — DEXAMETHASONE SODIUM PHOSPHATE 10 MG/ML IJ SOLN
INTRAMUSCULAR | Status: AC
  Filled 2014-11-27: qty 1

## 2014-11-27 MED ORDER — CLONIDINE HCL 0.1 MG PO TABS
0.1000 mg | ORAL_TABLET | Freq: Three times a day (TID) | ORAL | Status: DC
  Administered 2014-11-27 (×2): 0.1 mg via ORAL
  Filled 2014-11-27 (×7): qty 1

## 2014-11-27 MED ORDER — LACTATED RINGERS IV SOLN
INTRAVENOUS | Status: DC
  Administered 2014-11-27: 16:00:00 via INTRAVENOUS
  Administered 2014-11-27: 1000 mL via INTRAVENOUS

## 2014-11-27 MED ORDER — EPHEDRINE SULFATE 50 MG/ML IJ SOLN
INTRAMUSCULAR | Status: DC | PRN
  Administered 2014-11-27 (×2): 5 mg via INTRAVENOUS

## 2014-11-27 MED ORDER — ENOXAPARIN SODIUM 40 MG/0.4ML ~~LOC~~ SOLN
40.0000 mg | SUBCUTANEOUS | Status: DC
  Filled 2014-11-27: qty 0.4

## 2014-11-27 MED ORDER — CHLORHEXIDINE GLUCONATE 4 % EX LIQD
1.0000 "application " | Freq: Once | CUTANEOUS | Status: DC
  Filled 2014-11-27: qty 15

## 2014-11-27 MED ORDER — FENTANYL CITRATE 0.05 MG/ML IJ SOLN
INTRAMUSCULAR | Status: AC
  Filled 2014-11-27: qty 5

## 2014-11-27 MED ORDER — DEXAMETHASONE SODIUM PHOSPHATE 10 MG/ML IJ SOLN
INTRAMUSCULAR | Status: DC | PRN
  Administered 2014-11-27: 10 mg via INTRAVENOUS

## 2014-11-27 MED ORDER — PROMETHAZINE HCL 25 MG/ML IJ SOLN
6.2500 mg | INTRAMUSCULAR | Status: DC | PRN
Start: 2014-11-27 — End: 2014-11-27

## 2014-11-27 MED ORDER — PROPOFOL 10 MG/ML IV BOLUS
INTRAVENOUS | Status: DC | PRN
  Administered 2014-11-27: 130 mg via INTRAVENOUS

## 2014-11-27 MED ORDER — KETOROLAC TROMETHAMINE 30 MG/ML IJ SOLN
INTRAMUSCULAR | Status: DC | PRN
  Administered 2014-11-27: 30 mg via INTRAVENOUS

## 2014-11-27 MED ORDER — BUPIVACAINE-EPINEPHRINE 0.25% -1:200000 IJ SOLN
INTRAMUSCULAR | Status: DC | PRN
  Administered 2014-11-27: 50 mL

## 2014-11-27 MED ORDER — NEOSTIGMINE METHYLSULFATE 10 MG/10ML IV SOLN
INTRAVENOUS | Status: DC | PRN
  Administered 2014-11-27: 4 mg via INTRAVENOUS

## 2014-11-27 MED ORDER — KETOROLAC TROMETHAMINE 30 MG/ML IJ SOLN
INTRAMUSCULAR | Status: AC
  Filled 2014-11-27: qty 1

## 2014-11-27 MED ORDER — LACTATED RINGERS IR SOLN
Status: DC | PRN
  Administered 2014-11-27: 1000 mL

## 2014-11-27 MED ORDER — FENTANYL CITRATE 0.05 MG/ML IJ SOLN
INTRAMUSCULAR | Status: DC | PRN
  Administered 2014-11-27: 100 ug via INTRAVENOUS

## 2014-11-27 MED ORDER — CISATRACURIUM BESYLATE (PF) 10 MG/5ML IV SOLN
INTRAVENOUS | Status: DC | PRN
  Administered 2014-11-27: 2 mg via INTRAVENOUS
  Administered 2014-11-27: 6 mg via INTRAVENOUS

## 2014-11-27 MED ORDER — GLYCOPYRROLATE 0.2 MG/ML IJ SOLN
INTRAMUSCULAR | Status: DC | PRN
  Administered 2014-11-27: 0.6 mg via INTRAVENOUS

## 2014-11-27 MED ORDER — 0.9 % SODIUM CHLORIDE (POUR BTL) OPTIME
TOPICAL | Status: DC | PRN
  Administered 2014-11-27: 1000 mL

## 2014-11-27 MED ORDER — ONDANSETRON HCL 4 MG/2ML IJ SOLN
INTRAMUSCULAR | Status: DC | PRN
  Administered 2014-11-27: 4 mg via INTRAVENOUS

## 2014-11-27 MED ORDER — CISATRACURIUM BESYLATE 20 MG/10ML IV SOLN
INTRAVENOUS | Status: AC
  Filled 2014-11-27: qty 10

## 2014-11-27 MED ORDER — HYDROMORPHONE HCL 1 MG/ML IJ SOLN: 0.2500 mg | INTRAMUSCULAR | Status: DC | PRN

## 2014-11-27 MED ORDER — SUCCINYLCHOLINE CHLORIDE 20 MG/ML IJ SOLN
INTRAMUSCULAR | Status: DC | PRN
  Administered 2014-11-27: 80 mg via INTRAVENOUS

## 2014-11-27 MED ORDER — GLYCOPYRROLATE 0.2 MG/ML IJ SOLN
INTRAMUSCULAR | Status: AC
  Filled 2014-11-27: qty 1

## 2014-11-27 MED ORDER — MEPERIDINE HCL 50 MG/ML IJ SOLN: 6.2500 mg | INTRAMUSCULAR | Status: DC | PRN

## 2014-11-27 MED ORDER — GLYCOPYRROLATE 0.2 MG/ML IJ SOLN
INTRAMUSCULAR | Status: AC
  Filled 2014-11-27: qty 2

## 2014-11-27 MED ORDER — BUPIVACAINE-EPINEPHRINE 0.25% -1:200000 IJ SOLN
INTRAMUSCULAR | Status: AC
  Filled 2014-11-27: qty 1

## 2014-11-27 MED ORDER — PROPOFOL 10 MG/ML IV BOLUS
INTRAVENOUS | Status: AC
  Filled 2014-11-27: qty 20

## 2014-11-27 MED ORDER — EPHEDRINE SULFATE 50 MG/ML IJ SOLN
INTRAMUSCULAR | Status: AC
  Filled 2014-11-27: qty 1

## 2014-11-27 MED ORDER — ONDANSETRON HCL 4 MG/2ML IJ SOLN
INTRAMUSCULAR | Status: AC
  Filled 2014-11-27: qty 2

## 2014-11-27 SURGICAL SUPPLY — 37 items
APPLIER CLIP 5 13 M/L LIGAMAX5 (MISCELLANEOUS)
APPLIER CLIP ROT 10 11.4 M/L (STAPLE)
CLIP APPLIE 5 13 M/L LIGAMAX5 (MISCELLANEOUS) IMPLANT
CLIP APPLIE ROT 10 11.4 M/L (STAPLE) IMPLANT
CUTTER FLEX LINEAR 45M (STAPLE) ×3 IMPLANT
DECANTER SPIKE VIAL GLASS SM (MISCELLANEOUS) ×3 IMPLANT
DEVICE TROCAR PUNCTURE CLOSURE (ENDOMECHANICALS) IMPLANT
DISSECTOR ULTRASONIC 39CM (MISCELLANEOUS) ×3 IMPLANT
DRAPE LAPAROSCOPIC ABDOMINAL (DRAPES) ×3 IMPLANT
DRAPE WARM FLUID 44X44 (DRAPE) ×3 IMPLANT
DRSG TEGADERM 2-3/8X2-3/4 SM (GAUZE/BANDAGES/DRESSINGS) ×6 IMPLANT
DRSG TEGADERM 4X4.75 (GAUZE/BANDAGES/DRESSINGS) ×3 IMPLANT
ELECT REM PT RETURN 9FT ADLT (ELECTROSURGICAL) ×3
ELECTRODE REM PT RTRN 9FT ADLT (ELECTROSURGICAL) ×2 IMPLANT
ENDOLOOP SUT PDS II  0 18 (SUTURE)
ENDOLOOP SUT PDS II 0 18 (SUTURE) IMPLANT
GLOVE ECLIPSE 8.0 STRL XLNG CF (GLOVE) ×6 IMPLANT
GLOVE INDICATOR 8.0 STRL GRN (GLOVE) ×6 IMPLANT
GOWN STRL REUS W/TWL XL LVL3 (GOWN DISPOSABLE) ×9 IMPLANT
KIT BASIN OR (CUSTOM PROCEDURE TRAY) ×3 IMPLANT
PEN SKIN MARKING BROAD (MISCELLANEOUS) ×3 IMPLANT
POUCH SPECIMEN RETRIEVAL 10MM (ENDOMECHANICALS) ×3 IMPLANT
RELOAD 45 VASCULAR/THIN (ENDOMECHANICALS) IMPLANT
RELOAD STAPLE TA45 3.5 REG BLU (ENDOMECHANICALS) ×3 IMPLANT
SET IRRIG TUBING LAPAROSCOPIC (IRRIGATION / IRRIGATOR) ×3 IMPLANT
SHEARS HARMONIC ACE PLUS 36CM (ENDOMECHANICALS) IMPLANT
SLEEVE XCEL OPT CAN 5 100 (ENDOMECHANICALS) ×3 IMPLANT
SUT MNCRL AB 4-0 PS2 18 (SUTURE) ×3 IMPLANT
SUT VIC AB 2-0 SH 27 (SUTURE) ×1
SUT VIC AB 2-0 SH 27X BRD (SUTURE) ×2 IMPLANT
TOWEL OR 17X26 10 PK STRL BLUE (TOWEL DISPOSABLE) ×3 IMPLANT
TOWEL OR NON WOVEN STRL DISP B (DISPOSABLE) ×3 IMPLANT
TRAY FOLEY CATH 14FRSI W/METER (CATHETERS) IMPLANT
TRAY FOLEY CATH 16FRSI W/METER (SET/KITS/TRAYS/PACK) ×3 IMPLANT
TRAY LAPAROSCOPIC (CUSTOM PROCEDURE TRAY) ×3 IMPLANT
TROCAR BLADELESS OPT 5 100 (ENDOMECHANICALS) ×3 IMPLANT
TROCAR XCEL 12X100 BLDLESS (ENDOMECHANICALS) ×3 IMPLANT

## 2014-11-27 NOTE — Anesthesia Procedure Notes (Signed)
Procedure Name: Intubation Date/Time: 11/27/2014 3:22 PM Performed by: Early OsmondEARGLE, Mallarie Voorhies E Pre-anesthesia Checklist: Patient identified, Emergency Drugs available, Suction available and Patient being monitored Patient Re-evaluated:Patient Re-evaluated prior to inductionOxygen Delivery Method: Circle System Utilized Preoxygenation: Pre-oxygenation with 100% oxygen Intubation Type: IV induction, Rapid sequence and Cricoid Pressure applied Ventilation: Mask ventilation without difficulty Laryngoscope Size: Miller and 2 Grade View: Grade I Tube type: Oral Tube size: 7.5 mm Number of attempts: 1 Airway Equipment and Method: Stylet Placement Confirmation: ETT inserted through vocal cords under direct vision,  positive ETCO2 and breath sounds checked- equal and bilateral Secured at: 21 cm Tube secured with: Tape Dental Injury: Teeth and Oropharynx as per pre-operative assessment

## 2014-11-27 NOTE — Interval H&P Note (Signed)
History and Physical Interval Note:  11/27/2014 2:35 PM  Erik BargeKien Stanek  has presented today for surgery, with the diagnosis of appendicitis  The various methods of treatment have been discussed with the patient and family. After consideration of risks, benefits and other options for treatment, the patient has consented to  Procedure(s): APPENDECTOMY LAPAROSCOPIC WITH POSSIBLE ILEOCECECTOMY (N/A) ILEOCECETOMY (N/A) as a surgical intervention .  The patient's history has been reviewed, patient examined, no change in status, stable for surgery.  I have reviewed the patient's chart and labs.  Questions were answered to the patient's satisfaction.     [name]

## 2014-11-27 NOTE — Progress Notes (Signed)
Subjective: No new complaints, pain in RLQ.  Overall he looks pretty good.  Objective: Vital signs in last 24 hours: Temp:  [97.6 F (36.4 C)-98.2 F (36.8 C)] 98.2 F (36.8 C) (03/22 0546) Pulse Rate:  [62-72] 62 (03/22 0546) Resp:  [18-20] 20 (03/22 0546) BP: (116-183)/(64-109) 135/83 mmHg (03/22 0546) SpO2:  [98 %-100 %] 100 % (03/22 0546) Weight:  [53 kg (116 lb 13.5 oz)-55.792 kg (123 lb)] 53 kg (116 lb 13.5 oz) (03/21 2300) Last BM Date: 11/26/14 NPO Afebrile, VSS WBC is normal this AM CT scan 11/26/14 shows:  Mildly worsened appendicitis noted, with dilatation of the appendix to 1.1 cm in diameter, and mildly increased surrounding soft tissue inflammation, extending along the mesentery. Few mildly prominent adjacent lymph nodes seen. No evidence of perforation or abscess formation. Given that mild appendicitis was noted on the CT from February, this appears to reflect gradually worsening somewhat chronic appendicitis. 2. Previously noted mid ileal bowel inflammation has resolved; this likely reflected an acute infectious or inflammatory ileal process. The distal ileum is unremarkable in appearance; there is no evidence of terminal ileitis. 3.  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 Spleen otherwise unremarkable appearance.   Intake/Output from previous day: 03/21 0701 - 03/22 0700 In: 713.3 [I.V.:613.3; IV Piggyback:100] Out: -  Intake/Output this shift:    General appearance: alert, cooperative and no distress GI: soft, sore in RLQ.  +BS  Lab Results:   Recent Labs  11/26/14 2202 11/27/14 0538  WBC 5.8 5.3  HGB 14.2 12.8*  HCT 42.7 38.8*  PLT 299 280    BMET  Recent Labs  11/26/14 1650 11/26/14 2202  NA 141 138  K 4.5 3.9  CL 102 102  CO2 31 29  GLUCOSE 101* 78  BUN 20 18  CREATININE 0.97 0.99  CALCIUM 9.4 9.1   PT/INR No results for input(s): LABPROT, INR in the last 72  hours.   Recent Labs Lab 11/26/14 1650  AST 15  ALT 13  ALKPHOS 41  BILITOT 0.4  PROT 6.6  ALBUMIN 3.8     Lipase     Component Value Date/Time   LIPASE 20 11/03/2014 2050     Studies/Results: Ct Abdomen Pelvis W Contrast  11/26/2014   CLINICAL DATA:  Worsening subacute epigastric abdominal pain. Initial encounter.  EXAM: CT ABDOMEN AND PELVIS WITH CONTRAST  TECHNIQUE: Multidetector CT imaging of the abdomen and pelvis was performed using the standard protocol following bolus administration of intravenous contrast.  CONTRAST:  50mL OMNIPAQUE IOHEXOL 300 MG/ML SOLN, OMNIPAQUE IOHEXOL 300 MG/ML SOLN  COMPARISON:  CT of the abdomen and pelvis from 11/03/2014  FINDINGS: The visualized lung bases are clear.  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 The spleen is otherwise unremarkable. The liver is within normal limits. The gallbladder is within normal limits. The pancreas and adrenal glands are unremarkable.  The kidneys are unremarkable in appearance. There is no evidence of hydronephrosis. No renal or ureteral stones are seen. No perinephric stranding is appreciated.  No free fluid is identified. The previously noted inflamed loop of mid ileum has resolved. The visualized small bowel is partially filled with contrast and is unremarkable in appearance. The stomach is within normal limits. No acute vascular abnormalities are seen. 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 The aneurysmal portion measures 3.3 cm in transverse dimension and 2.5 cm in AP dimension. 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 There is mild ectasia of the left common iliac artery.  The appendix is dilated, slightly more prominent than on the prior study, extending towards the midline, measuring  up to 1.1 cm in diameter. Surrounding soft tissue inflammation is mildly increased, extending along the mesentery. This is concerning for mildly worsening relatively chronic appendicitis. A few mildly prominent adjacent nodes are seen.  The bladder is mildly distended and grossly unremarkable. The prostate remains normal in size, though centrally increased enhancement is again noted. Would correlate with PSA, to assess for underlying mass, and consider further evaluation as deemed clinically appropriate. No inguinal lymphadenopathy is seen.  No acute osseous abnormalities are identified.  IMPRESSION: 1. Mildly worsened appendicitis noted, with dilatation of the appendix to 1.1 cm in diameter, and mildly increased surrounding soft tissue inflammation, extending along the mesentery. Few mildly prominent adjacent lymph nodes seen. No evidence of perforation or abscess formation. Given that mild appendicitis was noted on the CT from February, this appears to reflect gradually worsening somewhat chronic appendicitis. 2. Previously noted mid ileal bowel inflammation has resolved; this likely reflected an acute infectious or inflammatory ileal process. The distal ileum is unremarkable in appearance; there is no evidence of terminal ileitis. 3. 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 This measures up to 3.3 cm in transverse dimension. Mild associated mural thrombus noted. Recommend followup by ultrasound in 3 years. This recommendation follows ACR consensus guidelines: White Paper of the ACR Incidental Findings Committee II on Vascular Findings. J Am Coll Radiol 2013; 16:109-604 4. 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 Spleen otherwise unremarkable appearance. 5. Centrally increased prostatic enhancement again noted, though the prostate  remains normal in size. Would correlate with PSA, to assess for underlying mass, and consider further evaluation as deemed clinically appropriate.  These results were called by telephone at the time of interpretation on 11/26/2014 at 10:23 pm to the physician at [facility], who verbally acknowledged these results.   Electronically Signed   By: Roanna Raider M.D.   On: 11/26/2014 22:23    Medications: . cloNIDine  0.1 mg Oral TID  . enoxaparin (LOVENOX) injection  40 mg Subcutaneous Q24H  . pantoprazole (PROTONIX) IV  40 mg Intravenous QHS  . piperacillin-tazobactam (ZOSYN)  IV  3.375 g Intravenous 3 times per day   . sodium chloride 100 mL/hr at 11/26/14 2337   Prior to Admission medications   Medication Sig Start Date End Date Taking? Authorizing Provider  atenolol-chlorthalidone (TENORETIC) 50-25 MG per tablet Take 1 tablet by mouth daily. 12/20/13  Yes Gwenlyn Found Copland, MD  benzonatate (TESSALON) 100 MG capsule Take 1 capsule (100 mg total) by mouth 3 (three) times daily as needed for cough. Patient not taking: Reported on 11/03/2014 01/09/14   Pearline Cables, MD  metroNIDAZOLE (FLAGYL) 500 MG tablet Take 1 tablet (500 mg total) by mouth 3 (three) times daily. Patient not taking: Reported on 11/26/2014 11/06/14   Nita Sells Mikhail, DO  pravastatin (PRAVACHOL) 20 MG tablet Take 1 tablet (20 mg total) by mouth daily. Patient not taking: Reported on 11/26/2014 07/03/14   Pearline Cables, MD     Assessment/Plan 1. Recurrent appendicitis 2. Uncontrolled hypertension 3. Recent h/o  terminal ileitis of unknown etiology. 4.  DVT:  SCD today, Lovenox SCD tomorrow.    Plan:  OR today.  Dr. Michaell CowingGross has discussed surgery with pt.  I have  Changed Lovenox to 1800 tomorrow.    LOS: 1 day    Nocole Zammit 11/27/2014

## 2014-11-27 NOTE — Anesthesia Postprocedure Evaluation (Signed)
  Anesthesia Post-op Note  Patient: Erik Aguirre  Procedure(s) Performed: Procedure(s) (LRB): APPENDECTOMY LAPAROSCOPIC  (N/A)  Patient Location: PACU  Anesthesia Type: General  Level of Consciousness: awake and alert   Airway and Oxygen Therapy: Patient Spontanous Breathing  Post-op Pain: mild  Post-op Assessment: Post-op Vital signs reviewed, Patient's Cardiovascular Status Stable, Respiratory Function Stable, Patent Airway and No signs of Nausea or vomiting  Last Vitals:  Filed Vitals:   11/27/14 1712  BP: 148/83  Pulse: 66  Temp: 36.4 C  Resp: 14    Post-op Vital Signs: stable   Complications: No apparent anesthesia complications

## 2014-11-27 NOTE — Anesthesia Preprocedure Evaluation (Addendum)
Anesthesia Evaluation  Patient identified by MRN, date of birth, ID band Patient awake    Reviewed: Allergy & Precautions, NPO status , Patient's Chart, lab work & pertinent test results  Airway Mallampati: II  TM Distance: >3 FB Neck ROM: Full    Dental no notable dental hx.    Pulmonary neg pulmonary ROS,  breath sounds clear to auscultation  Pulmonary exam normal       Cardiovascular hypertension, Pt. on medications Rhythm:Regular Rate:Normal     Neuro/Psych negative neurological ROS  negative psych ROS   GI/Hepatic negative GI ROS, Neg liver ROS,   Endo/Other  negative endocrine ROS  Renal/GU negative Renal ROS     Musculoskeletal negative musculoskeletal ROS (+)   Abdominal   Peds  Hematology negative hematology ROS (+)   Anesthesia Other Findings   Reproductive/Obstetrics negative OB ROS                            Anesthesia Physical Anesthesia Plan  ASA: II  Anesthesia Plan: General   Post-op Pain Management:    Induction: Intravenous, Rapid sequence and Cricoid pressure planned  Airway Management Planned: Oral ETT  Additional Equipment:   Intra-op Plan:   Post-operative Plan: Extubation in OR  Informed Consent: I have reviewed the patients History and Physical, chart, labs and discussed the procedure including the risks, benefits and alternatives for the proposed anesthesia with the patient or authorized representative who has indicated his/her understanding and acceptance.   Dental advisory given  Plan Discussed with: CRNA  Anesthesia Plan Comments:         Anesthesia Quick Evaluation

## 2014-11-27 NOTE — Plan of Care (Signed)
Problem: Consults Goal: Nutrition Consult-if indicated Outcome: Progressing Pt has lost weight, will consult nutrition for ensure orders per protocol

## 2014-11-27 NOTE — Discharge Instructions (Signed)
LAPAROSCOPIC SURGERY: POST OP INSTRUCTIONS ° °1. DIET: Follow a light bland diet the first 24 hours after arrival home, such as soup, liquids, crackers, etc.  Be sure to include lots of fluids daily.  Avoid fast food or heavy meals as your are more likely to get nauseated.  Eat a low fat the next few days after surgery.   °2. Take your usually prescribed home medications unless otherwise directed. °3. PAIN CONTROL: °a. Pain is best controlled by a usual combination of three different methods TOGETHER: °i. Ice/Heat °ii. Over the counter pain medication °iii. Prescription pain medication °b. Most patients will experience some swelling and bruising around the incisions.  Ice packs or heating pads (30-60 minutes up to 6 times a day) will help. Use ice for the first few days to help decrease swelling and bruising, then switch to heat to help relax tight/sore spots and speed recovery.  Some people prefer to use ice alone, heat alone, alternating between ice & heat.  Experiment to what works for you.  Swelling and bruising can take several weeks to resolve.   °c. It is helpful to take an over-the-counter pain medication regularly for the first few weeks.  Choose one of the following that works best for you: °i. Naproxen (Aleve, etc)  Two 220mg tabs twice a day °ii. Ibuprofen (Advil, etc) Three 200mg tabs four times a day (every meal & bedtime) °iii. Acetaminophen (Tylenol, etc) 500-650mg four times a day (every meal & bedtime) °d. A  prescription for pain medication (such as oxycodone, hydrocodone, etc) should be given to you upon discharge.  Take your pain medication as prescribed.  °i. If you are having problems/concerns with the prescription medicine (does not control pain, nausea, vomiting, rash, itching, etc), please call us [phone] to see if we need to switch you to a different pain medicine that will work better for you and/or control your side effect better. °ii. If you need a refill on your pain medication,  please contact your pharmacy.  They will contact our office to request authorization. Prescriptions will not be filled after 5 pm or on week-ends. °4. Avoid getting constipated.  Between the surgery and the pain medications, it is common to experience some constipation.  Increasing fluid intake and taking a fiber supplement (such as Metamucil, Citrucel, FiberCon, MiraLax, etc) 1-2 times a day regularly will usually help prevent this problem from occurring.  A mild laxative (prune juice, Milk of Magnesia, MiraLax, etc) should be taken according to package directions if there are no bowel movements after 48 hours.   °5. Watch out for diarrhea.  If you have many loose bowel movements, simplify your diet to bland foods & liquids for a few days.  Stop any stool softeners and decrease your fiber supplement.  Switching to mild anti-diarrheal medications (Kayopectate, Pepto Bismol) can help.  If this worsens or does not improve, please call us. °6. Wash / shower every day.  You may shower over the dressings as they are waterproof.  Continue to shower over incision(s) after the dressing is off. °7. Remove your waterproof bandages 5 days after surgery.  You may leave the incision open to air.  You may replace a dressing/Band-Aid to cover the incision for comfort if you wish.  °8. ACTIVITIES as tolerated:   °a. You may resume regular (light) daily activities beginning the next day--such as daily self-care, walking, climbing stairs--gradually increasing activities as tolerated.  If you can walk 30 minutes without difficulty, it   is safe to try more intense activity such as jogging, treadmill, bicycling, low-impact aerobics, swimming, etc. b. Save the most intensive and strenuous activity for last such as sit-ups, heavy lifting, contact sports, etc  Refrain from any heavy lifting or straining until you are off narcotics for pain control.   c. DO NOT PUSH THROUGH PAIN.  Let pain be your guide: If it hurts to do something, don't  do it.  Pain is your body warning you to avoid that activity for another week until the pain goes down. d. You may drive when you are no longer taking prescription pain medication, you can comfortably wear a seatbelt, and you can safely maneuver your car and apply brakes. e. Dennis Bast may have sexual intercourse when it is comfortable.  9. FOLLOW UP in our office a. Please call [facility] at [phone] to set up an appointment to see your surgeon in the office for a follow-up appointment approximately 2-3 weeks after your surgery. b. Make sure that you call for this appointment the day you arrive home to insure a convenient appointment time. 10. IF YOU HAVE DISABILITY OR FAMILY LEAVE FORMS, BRING THEM TO THE OFFICE FOR PROCESSING.  DO NOT GIVE THEM TO YOUR DOCTOR.   WHEN TO CALL us [phone]: 1. Poor pain control 2. Reactions / problems with new medications (rash/itching, nausea, etc)  3. Fever over 101.5 F (38.5 C) 4. Inability to urinate 5. Nausea and/or vomiting 6. Worsening swelling or bruising 7. Continued bleeding from incision. 8. Increased pain, redness, or drainage from the incision   The clinic staff is available to answer your questions during regular business hours (8:30am-5pm).  Please dont hesitate to call and ask to speak to one of our nurses for clinical concerns.   If you have a medical emergency, go to the nearest emergency room or call 911.  A surgeon from [facility] is always on call at the [facility], [place], [place], [place],   [address] ? MAIN: [phone] ? TOLL FREE: [phone] ?  FAX [phone] [email or web address]  GETTING TO GOOD BOWEL HEALTH. Irregular bowel habits such as constipation and diarrhea can lead to many problems over time.  Having one soft bowel movement a day is the most important way to prevent further problems.  The anorectal canal is designed to handle  stretching and feces to safely manage our ability to get rid of solid waste (feces, poop, stool) out of our body.  BUT, hard constipated stools can act like ripping concrete bricks and diarrhea can be a burning fire to this very sensitive area of our body, causing inflamed hemorrhoids, anal fissures, increasing risk is perirectal abscesses, abdominal pain/bloating, an making irritable bowel worse.     The goal: ONE SOFT BOWEL MOVEMENT A DAY!  To have soft, regular bowel movements:   Drink at least 8 tall glasses of water a day.    Take plenty of fiber.  Fiber is the undigested part of plant food that passes into the colon, acting s natures broom to encourage bowel motility and movement.  Fiber can absorb and hold large amounts of water. This results in a larger, bulkier stool, which is soft and easier to pass. Work gradually over several weeks up to 6 servings a day of fiber (25g a day even more if needed) in the form of: o Vegetables -- Root (potatoes, carrots, turnips), leafy green (lettuce, salad greens, celery, spinach), or  cooked high residue (cabbage, broccoli, etc) o Fruit -- Fresh (unpeeled skin & pulp), Dried (prunes, apricots, cherries, etc ),  or stewed ( applesauce)  o Whole grain breads, pasta, etc (whole wheat)  o Bran cereals   Bulking Agents -- This type of water-retaining fiber generally is easily obtained each day by one of the following:  o Psyllium bran -- The psyllium plant is remarkable because its ground seeds can retain so much water. This product is available as Metamucil, Konsyl, Effersyllium, Per Diem Fiber, or the less expensive generic preparation in drug and health food stores. Although labeled a laxative, it really is not a laxative.  o Methylcellulose -- This is another fiber derived from wood which also retains water. It is available as Citrucel. o Polyethylene Glycol - and artificial fiber commonly called Miralax or Glycolax.  It is helpful for people with gassy or  bloated feelings with regular fiber o Flax Seed - a less gassy fiber than psyllium  No reading or other relaxing activity while on the toilet. If bowel movements take longer than 5 minutes, you are too constipated  AVOID CONSTIPATION.  High fiber and water intake usually takes care of this.  Sometimes a laxative is needed to stimulate more frequent bowel movements, but   Laxatives are not a good long-term solution as it can wear the colon out. o Osmotics (Milk of Magnesia, Fleets phosphosoda, Magnesium citrate, MiraLax, GoLytely) are safer than  o Stimulants (Senokot, Castor Oil, Dulcolax, Ex Lax)    o Do not take laxatives for more than 7days in a row.   IF SEVERELY CONSTIPATED, try a Bowel Retraining Program: o Do not use laxatives.  o Eat a diet high in roughage, such as bran cereals and leafy vegetables.  o Drink six (6) ounces of prune or apricot juice each morning.  o Eat two (2) large servings of stewed fruit each day.  o Take one (1) heaping tablespoon of a psyllium-based bulking agent twice a day. Use sugar-free sweetener when possible to avoid excessive calories.  o Eat a normal breakfast.  o Set aside 15 minutes after breakfast to sit on the toilet, but do not strain to have a bowel movement.  o If you do not have a bowel movement by the third day, use an enema and repeat the above steps.   Controlling diarrhea o Switch to liquids and simpler foods for a few days to avoid stressing your intestines further. o Avoid dairy products (especially milk & ice cream) for a short time.  The intestines often can lose the ability to digest lactose when stressed. o Avoid foods that cause gassiness or bloating.  Typical foods include beans and other legumes, cabbage, broccoli, and dairy foods.  Every person has some sensitivity to other foods, so listen to our body and avoid those foods that trigger problems for you. o Adding fiber (Citrucel, Metamucil, psyllium, Miralax) gradually can help  thicken stools by absorbing excess fluid and retrain the intestines to act more normally.  Slowly increase the dose over a few weeks.  Too much fiber too soon can backfire and cause cramping & bloating. o Probiotics (such as active yogurt, Align, etc) may help repopulate the intestines and colon with normal bacteria and calm down a sensitive digestive tract.  Most studies show it to be of mild help, though, and such products can be costly. o Medicines: - Bismuth subsalicylate (ex. Kayopectate, Pepto Bismol) every 30 minutes for up to 6 doses can help  control diarrhea.  Avoid if pregnant. - Loperamide (Immodium) can slow down diarrhea.  Start with two tablets (  total) first and then try one tablet every 6 hours.  Avoid if you are having fevers or severe pain.  If you are not better or start feeling worse, stop all medicines and call your doctor for advice o Call your doctor if you are getting worse or not better.  Sometimes further testing (cultures, endoscopy, X-ray studies, bloodwork, etc) may be needed to help diagnose and treat the cause of the diarrhea.  GETTING TO GOOD BOWEL HEALTH. Irregular bowel habits such as constipation and diarrhea can lead to many problems over time.  Having one soft bowel movement a day is the most important way to prevent further problems.  The anorectal canal is designed to handle stretching and feces to safely manage our ability to get rid of solid waste (feces, poop, stool) out of our body.  BUT, hard constipated stools can act like ripping concrete bricks and diarrhea can be a burning fire to this very sensitive area of our body, causing inflamed hemorrhoids, anal fissures, increasing risk is perirectal abscesses, abdominal pain/bloating, an making irritable bowel worse.     The goal: ONE SOFT BOWEL MOVEMENT A DAY!  To have soft, regular bowel movements:   Drink at least 8 tall glasses of water a day.    Take plenty of fiber.  Fiber is the undigested part of plant  food that passes into the colon, acting s natures broom to encourage bowel motility and movement.  Fiber can absorb and hold large amounts of water. This results in a larger, bulkier stool, which is soft and easier to pass. Work gradually over several weeks up to 6 servings a day of fiber (25g a day even more if needed) in the form of: o Vegetables -- Root (potatoes, carrots, turnips), leafy green (lettuce, salad greens, celery, spinach), or cooked high residue (cabbage, broccoli, etc) o Fruit -- Fresh (unpeeled skin & pulp), Dried (prunes, apricots, cherries, etc ),  or stewed ( applesauce)  o Whole grain breads, pasta, etc (whole wheat)  o Bran cereals   Bulking Agents -- This type of water-retaining fiber generally is easily obtained each day by one of the following:  o Psyllium bran -- The psyllium plant is remarkable because its ground seeds can retain so much water. This product is available as Metamucil, Konsyl, Effersyllium, Per Diem Fiber, or the less expensive generic preparation in drug and health food stores. Although labeled a laxative, it really is not a laxative.  o Methylcellulose -- This is another fiber derived from wood which also retains water. It is available as Citrucel. o Polyethylene Glycol - and artificial fiber commonly called Miralax or Glycolax.  It is helpful for people with gassy or bloated feelings with regular fiber o Flax Seed - a less gassy fiber than psyllium  No reading or other relaxing activity while on the toilet. If bowel movements take longer than 5 minutes, you are too constipated  AVOID CONSTIPATION.  High fiber and water intake usually takes care of this.  Sometimes a laxative is needed to stimulate more frequent bowel movements, but   Laxatives are not a good long-term solution as it can wear the colon out. o Osmotics (Milk of Magnesia, Fleets phosphosoda, Magnesium citrate, MiraLax, GoLytely) are safer than  o Stimulants (Senokot, Castor Oil,  Dulcolax, Ex Lax)    o Do not take laxatives for more than 7days in a  row.   IF SEVERELY CONSTIPATED, try a Bowel Retraining Program: o Do not use laxatives.  o Eat a diet high in roughage, such as bran cereals and leafy vegetables.  o Drink six (6) ounces of prune or apricot juice each morning.  o Eat two (2) large servings of stewed fruit each day.  o Take one (1) heaping tablespoon of a psyllium-based bulking agent twice a day. Use sugar-free sweetener when possible to avoid excessive calories.  o Eat a normal breakfast.  o Set aside 15 minutes after breakfast to sit on the toilet, but do not strain to have a bowel movement.  o If you do not have a bowel movement by the third day, use an enema and repeat the above steps.   Controlling diarrhea o Switch to liquids and simpler foods for a few days to avoid stressing your intestines further. o Avoid dairy products (especially milk & ice cream) for a short time.  The intestines often can lose the ability to digest lactose when stressed. o Avoid foods that cause gassiness or bloating.  Typical foods include beans and other legumes, cabbage, broccoli, and dairy foods.  Every person has some sensitivity to other foods, so listen to our body and avoid those foods that trigger problems for you. o Adding fiber (Citrucel, Metamucil, psyllium, Miralax) gradually can help thicken stools by absorbing excess fluid and retrain the intestines to act more normally.  Slowly increase the dose over a few weeks.  Too much fiber too soon can backfire and cause cramping & bloating. o Probiotics (such as active yogurt, Align, etc) may help repopulate the intestines and colon with normal bacteria and calm down a sensitive digestive tract.  Most studies show it to be of mild help, though, and such products can be costly. o Medicines: - Bismuth subsalicylate (ex. Kayopectate, Pepto Bismol) every 30 minutes for up to 6 doses can help control diarrhea.  Avoid if  pregnant. - Loperamide (Immodium) can slow down diarrhea.  Start with two tablets (4mg  total) first and then try one tablet every 6 hours.  Avoid if you are having fevers or severe pain.  If you are not better or start feeling worse, stop all medicines and call your doctor for advice o Call your doctor if you are getting worse or not better.  Sometimes further testing (cultures, endoscopy, X-ray studies, bloodwork, etc) may be needed to help diagnose and treat the cause of the diarrhea.   Vim Ru?t Th?a (Appendicitis) Vim ru?t th?a l khi ru?t th?a b? s?ng (vim). S? vim nhi?m ny c th? d?n ??n vi?c pht tri?n m?t l? (th?ng) v tch t? m? (p xe).  NGUYN NHN Khng ph?i lc no c?ng c th? xc ??nh ???c nguyn nhn r rng gy ra vim ru?t th?a. ?i khi b?nh do t?c ru?t th?a gy ra. T?c ngh?n c th? gy ra b?i:  C?c phn nh?, c?ng, c? b?ng h?t ??u (s?i phn).  S?ng cc tuy?n b?ch huy?t ? ru?t th?a. TRI?U CH?NG  ?au quanh r?n lan v? pha b?ng d??i bn ph?i. ?au c th? tr? nn n?ng h?n v d? ??i theo th?i gian.  Nh?y c?m ?au ? b?ng d??i bn ph?i. ?au nhi?u h?n n?u b?n ho ho?c di chuy?n ??t ng?t.  C?m th?y kh ch?u trong d? dy (bu?n nn).  Nn m?a.  M?t c?m gic ngon mi?ng.  S?t.  To bn.  Tiu ch?y.  Nhn chung c?m th?y khng kh?e. CH?N ?ON  Khm  th?c th?.  Xt nghi?m mu.  Xt nghi?m n??c ti?u.  Ch?p X quang ho?c ch?p CT c th? xc ??nh ch?n ?on. ?I?U TR? Sau khi ch?n ?on vim ru?t th?a, cch ?i?u tr? ph? bi?n nh?t l c?t b? ru?t th?a cng s?m cng t?t. Th? thu?t ny ???c g?i l th? thu?t c?t b? ru?t th?a. Trong th? thu?t c?t b? ru?t th?a m? ? b?ng, m?t v?t c?t (v?t m?) ???c r?ch ? vng b?ng d??i bn ph?i v ru?t th?a s? ???c c?t. Trong th? thu?t c?t b? ru?t th?a b?ng n?i soi, th??ng s? r?ch 3 v?t. Cc d?ng c? di, m?ng v m?t ?ng c camera s? ???c s? d?ng ?? c?t b? ru?t th?a. H?u h?t b?nh nhn v? nh trong 24 ??n 48 gi? sau khi th?c hi?n th? thu?t c?t b? ru?t  th?a. Trong m?t s? tr??ng h?p, ru?t th?a c th? ? b? th?ng v p xe c th? ? hnh thnh. p xe c th? c m?t "b?c t??ng" quanh n nh? nhn th?y trn hnh ch?p CT. Trong tr??ng h?p ny, c th? ??t m?t ?ng d?n l?u vo ch? p xe ?? ht d?ch v b?n c th? ???c ?i?u tr? b?ng thu?c khng sinh ?? di?t vi trng. Thu?c ???c ??a vo thng qua m?t ?ng trong t?nh m?ch (IV). Khi p xe ? ???c x? l, c th? c?n ho?c khng c?n th? thu?t c?t b? ru?t th?a. B?n c th? c?n ? l?i b?nh vi?n nhi?u h?n 48 ti?ng. Document Released: 08/24/2005 Document Revised: 04/26/2013 [facility] Patient Information 2015 [place], [place]. This information is not intended to replace advice given to you by your health care provider. Make sure you discuss any questions you have with your health care provider.

## 2014-11-27 NOTE — Progress Notes (Signed)
UR complete 

## 2014-11-27 NOTE — Op Note (Signed)
4:22 PM  PATIENT:  Erik Aguirre  74 y.o. 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   INDICATIONS: Patient with concerning symptoms & work up suspicious for appendicitis.  Surgery was recommended:  The anatomy & physiology of the digestive tract was discussed.  The pathophysiology of appendicitis was discussed.  Natural history risks without surgery was discussed.   I feel the risks of no intervention will lead to serious problems that outweigh the operative risks; therefore, I recommended diagnostic laparoscopy with removal of appendix to remove the pathology.  Laparoscopic & open techniques were discussed.   I noted a good likelihood this will help address the problem.    Risks such as bleeding, infection, abscess, leak, reoperation, possible ostomy, hernia, heart attack, death, and other risks were discussed.  Goals of post-operative recovery were discussed as well.  We will work to minimize complications.  Questions were answered.  The patient expresses understanding & wishes to proceed with surgery.  OR FINDINGS:   Inflamed appendix especially at tip.  Mild phlegmon suspicious for contained perforation.  No major separation.  No major abscess.  No Meckel's diverticulum.  No ileitis.  Small bowel normal.  Colon  Normal.  Direct space left inguinal hernia.  Not incarcerated.  DESCRIPTION:   The patient was identified & brought into the operating room. The patient was positioned supine with arms tucked. SCDs were active during the entire case. The patient underwent general anesthesia without any difficulty.  The abdomen was prepped and draped in a sterile fashion. A Surgical Timeout confirmed our plan.  I placed a 5mm port using optical entry.  We induced carbon dioxide insufflation.  Camera inspection revealed no injury.  I placed additional ports under direct laparoscopic visualization.  I mobilized the terminal ileum to proximal ascending colon in a lateral to medial fashion.  I took care to avoid injuring any retroperitoneal structures.   I freed the appendix off its attachments to the ascending colon and cecal mesentery.  I elevated the appendix. I skeletonized the mesoappendix. I was able to free off the base of the appendix which was still viable.  I stapled the appendix off the cecum using a laparoscopic stapler. I took a healthy cuff of viable cecum. I ligated the mesoappendix and assured hemostasis in the mesentery.  I placed the appendix inside an EndoCatch bag and removed out the 12 mm port.  I did copious irrigation. Hemostasis was good in the mesoappendix, colon mesentery, and retroperitoneum. Staple line was intact on the cecum with bleeding.  Not able to control with ultrasonic dissection.  I controlled it with a 3-0 Vicryl intracorporeal suture.  He is at attack the mesial appendix over the staple line to help protect the line as well.  That provided good hemostasis.  I washed out the pelvis, retrohepatic space  and right paracolic gutter. I washed out the left side as well.  Hemostasis is good. There was no perforation or injury.  Because the area cleaned up well after irrigation, I did not place a drain.  I ran the small bowel from the ileocecal valve to the ligament of Treitz.  No inflamed small  bowel.  No Meckel's diverticulum.:  Stomach and rest of abdomen looked normal.  Moderate but shallow direct space inguinal hernia.  Not incarcerated or strangulate it.  I aspirated the carbon dioxide. I removed the ports. I closed the 12 mm fascia site using a 0 Vicryl stitch. I closed skin using 4-0 monocryl stitch.  Patient was extubated and sent to the recovery room.  I discussed the operative findings with the patient's family. I suspect the patient is going used in the hospital at least overnight and will need antibiotics for days. Questions answered. They expressed understanding and appreciation.  Ardeth SportsmanSteven C. Ameria Sanjurjo, M.D., F.A.C.S. Gastrointestinal and Minimally Invasive Surgery [facility] [address], [address] [place], [place] [id number] [phone] Main / Paging

## 2014-11-27 NOTE — Transfer of Care (Signed)
Immediate Anesthesia Transfer of Care Note  Patient: Erik Aguirre  Procedure(s) Performed: Procedure(s) (LRB): APPENDECTOMY LAPAROSCOPIC  (N/A)  Patient Location: PACU  Anesthesia Type: General  Level of Consciousness: sedated, patient cooperative and responds to stimulation  Airway & Oxygen Therapy: Patient Spontanous Breathing and Patient connected to face mask oxgen  Post-op Assessment: Report given to PACU RN and Post -op Vital signs reviewed and stable  Post vital signs: Reviewed and stable  Complications: No apparent anesthesia complications

## 2014-11-28 ENCOUNTER — Encounter (HOSPITAL_COMMUNITY): Payer: Self-pay | Admitting: Surgery

## 2014-11-28 MED ORDER — NAPROXEN 500 MG PO TABS
500.0000 mg | ORAL_TABLET | Freq: Two times a day (BID) | ORAL | Status: DC | PRN
  Filled 2014-11-28: qty 1

## 2014-11-28 MED ORDER — MAGIC MOUTHWASH
15.0000 mL | Freq: Four times a day (QID) | ORAL | Status: DC | PRN
  Filled 2014-11-28: qty 15

## 2014-11-28 MED ORDER — SODIUM CHLORIDE 0.9 % IV SOLN: 250.0000 mL | INTRAVENOUS | Status: DC | PRN

## 2014-11-28 MED ORDER — LIP MEDEX EX OINT: 1.0000 "application " | TOPICAL_OINTMENT | Freq: Two times a day (BID) | CUTANEOUS | Status: DC

## 2014-11-28 MED ORDER — SODIUM CHLORIDE 0.9 % IJ SOLN: 3.0000 mL | Freq: Two times a day (BID) | INTRAMUSCULAR | Status: DC

## 2014-11-28 MED ORDER — LACTATED RINGERS IV BOLUS (SEPSIS): 1000.0000 mL | Freq: Three times a day (TID) | INTRAVENOUS | Status: DC | PRN

## 2014-11-28 MED ORDER — DIPHENHYDRAMINE HCL 50 MG/ML IJ SOLN: 12.5000 mg | Freq: Four times a day (QID) | INTRAMUSCULAR | Status: DC | PRN

## 2014-11-28 MED ORDER — SODIUM CHLORIDE 0.9 % IJ SOLN: 3.0000 mL | INTRAMUSCULAR | Status: DC | PRN

## 2014-11-28 MED ORDER — BENZONATATE 100 MG PO CAPS: 100.0000 mg | ORAL_CAPSULE | Freq: Three times a day (TID) | ORAL | Status: DC | PRN

## 2014-11-28 MED ORDER — ACETAMINOPHEN 325 MG PO TABS: 325.0000 mg | ORAL_TABLET | Freq: Four times a day (QID) | ORAL | Status: DC | PRN

## 2014-11-28 MED ORDER — PHENOL 1.4 % MT LIQD
2.0000 | OROMUCOSAL | Status: DC | PRN
  Filled 2014-11-28: qty 177

## 2014-11-28 MED ORDER — ACETAMINOPHEN 650 MG RE SUPP: 650.0000 mg | Freq: Four times a day (QID) | RECTAL | Status: DC | PRN

## 2014-11-28 MED ORDER — HYDROCODONE-ACETAMINOPHEN 5-325 MG PO TABS: 1.0000 | ORAL_TABLET | Freq: Four times a day (QID) | ORAL | Status: DC | PRN

## 2014-11-28 MED ORDER — MENTHOL 3 MG MT LOZG
1.0000 | LOZENGE | OROMUCOSAL | Status: DC | PRN
  Filled 2014-11-28: qty 9

## 2014-11-28 MED ORDER — NAPROXEN 500 MG PO TABS: 500.0000 mg | ORAL_TABLET | Freq: Two times a day (BID) | ORAL | Status: DC | PRN

## 2014-11-28 MED ORDER — ACETAMINOPHEN 325 MG PO TABS
325.0000 mg | ORAL_TABLET | Freq: Four times a day (QID) | ORAL | Status: DC | PRN
Start: 2014-11-28 — End: 2014-11-28

## 2014-11-28 MED ORDER — ALUM & MAG HYDROXIDE-SIMETH 200-200-20 MG/5ML PO SUSP: 30.0000 mL | Freq: Four times a day (QID) | ORAL | Status: DC | PRN

## 2014-11-28 MED ORDER — ATENOLOL-CHLORTHALIDONE 50-25 MG PO TABS: 1.0000 | ORAL_TABLET | Freq: Every day | ORAL | Status: DC

## 2014-11-28 MED ORDER — CHLORTHALIDONE 25 MG PO TABS
25.0000 mg | ORAL_TABLET | Freq: Every day | ORAL | Status: DC
  Administered 2014-11-28: 25 mg via ORAL
  Filled 2014-11-28: qty 1

## 2014-11-28 MED ORDER — PROMETHAZINE HCL 25 MG/ML IJ SOLN: 6.2500 mg | INTRAMUSCULAR | Status: DC | PRN

## 2014-11-28 MED ORDER — ATENOLOL 50 MG PO TABS
50.0000 mg | ORAL_TABLET | Freq: Every day | ORAL | Status: DC
  Administered 2014-11-28: 50 mg via ORAL
  Filled 2014-11-28: qty 1

## 2014-11-28 MED ORDER — AMOXICILLIN-POT CLAVULANATE 875-125 MG PO TABS: 1.0000 | ORAL_TABLET | Freq: Two times a day (BID) | ORAL | Status: AC

## 2014-11-28 MED ORDER — HYDROCODONE-ACETAMINOPHEN 5-325 MG PO TABS
1.0000 | ORAL_TABLET | ORAL | Status: DC | PRN
  Administered 2014-11-28: 2 via ORAL
  Filled 2014-11-28: qty 2

## 2014-11-28 NOTE — Progress Notes (Signed)
1 Day Post-Op  Subjective: Patient feeling well today. He relates having 2-3/10 pain around incision sites. He requests oral pain medication. He endorses being out of bed and ambulating independently. No N/V. No SOB. Has urinated without difficulty and has return of flatus. No other complaints.  Objective: Vital signs in last 24 hours: Temp:  [97.4 F (36.3 C)-97.8 F (36.6 C)] 97.8 F (36.6 C) (03/23 0500) Pulse Rate:  [65-98] 65 (03/23 0500) Resp:  [14-18] 18 (03/23 0500) BP: (113-160)/(63-89) 114/65 mmHg (03/23 0500) SpO2:  [95 %-100 %] 97 % (03/23 0500) Last BM Date: 11/26/14  Intake/Output from previous day: 03/22 0701 - 03/23 0700 In: 2826.7 [I.V.:2826.7] Out: 300 [Urine:300] Intake/Output this shift:    PE: General- In NAD CV- RRR Pulm - CTA b/l Abdomen- Soft/non-distended. Incision clean/dry/intact/dressed. BS WNL  Lab Results:   Recent Labs  11/26/14 2202 11/27/14 0538  WBC 5.8 5.3  HGB 14.2 12.8*  HCT 42.7 38.8*  PLT 299 280   BMET  Recent Labs  11/26/14 1650 11/26/14 2202  NA 141 138  K 4.5 3.9  CL 102 102  CO2 31 29  GLUCOSE 101* 78  BUN 20 18  CREATININE 0.97 0.99  CALCIUM 9.4 9.1   PT/INR No results for input(s): LABPROT, INR in the last 72 hours. Comprehensive Metabolic Panel:    Component Value Date/Time   NA 138 11/26/2014 2202   NA 141 11/26/2014 1650   K 3.9 11/26/2014 2202   K 4.5 11/26/2014 1650   CL 102 11/26/2014 2202   CL 102 11/26/2014 1650   CO2 29 11/26/2014 2202   CO2 31 11/26/2014 1650   BUN 18 11/26/2014 2202   BUN 20 11/26/2014 1650   CREATININE 0.99 11/26/2014 2202   CREATININE 0.97 11/26/2014 1650   CREATININE 1.09 11/06/2014 0447   CREATININE 1.28 11/01/2014 1533   GLUCOSE 78 11/26/2014 2202   GLUCOSE 101* 11/26/2014 1650   CALCIUM 9.1 11/26/2014 2202   CALCIUM 9.4 11/26/2014 1650   AST 15 11/26/2014 1650   AST 14 11/04/2014 0417   ALT 13 11/26/2014 1650   ALT 12 11/04/2014 0417   ALKPHOS 41 11/26/2014  1650   ALKPHOS 46 11/04/2014 0417   BILITOT 0.4 11/26/2014 1650   BILITOT 0.6 11/04/2014 0417   PROT 6.6 11/26/2014 1650   PROT 5.6* 11/04/2014 0417   ALBUMIN 3.8 11/26/2014 1650   ALBUMIN 2.9* 11/04/2014 0417     Studies/Results: Ct Abdomen Pelvis W Contrast  11/26/2014   CLINICAL DATA:  Worsening subacute epigastric abdominal pain. Initial encounter.  EXAM: CT ABDOMEN AND PELVIS WITH CONTRAST  TECHNIQUE: Multidetector CT imaging of the abdomen and pelvis was performed using the standard protocol following bolus administration of intravenous contrast.  CONTRAST:  50mL OMNIPAQUE IOHEXOL 300 MG/ML SOLN, OMNIPAQUE IOHEXOL 300 MG/ML SOLN  COMPARISON:  CT of the abdomen and pelvis from 11/03/2014  FINDINGS: The visualized lung bases are clear.  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 The spleen is otherwise unremarkable. The liver is within normal limits. The gallbladder is within normal limits. The pancreas and adrenal glands are unremarkable.  The kidneys are unremarkable in appearance. There is no evidence of hydronephrosis. No renal or ureteral stones are seen. No perinephric stranding is appreciated.  No free fluid is identified. The previously noted inflamed loop of mid ileum has resolved. The visualized small bowel is partially filled with contrast and is unremarkable in appearance.  The stomach is within normal limits. No acute vascular abnormalities are seen. 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 The aneurysmal portion measures 3.3 cm in transverse dimension and 2.5 cm in AP dimension. 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 There is mild ectasia of the left common iliac artery.  The appendix is dilated, slightly more prominent than on the prior study, extending  towards the midline, measuring up to 1.1 cm in diameter. Surrounding soft tissue inflammation is mildly increased, extending along the mesentery. This is concerning for mildly worsening relatively chronic appendicitis. A few mildly prominent adjacent nodes are seen.  The bladder is mildly distended and grossly unremarkable. The prostate remains normal in size, though centrally increased enhancement is again noted. Would correlate with PSA, to assess for underlying mass, and consider further evaluation as deemed clinically appropriate. No inguinal lymphadenopathy is seen.  No acute osseous abnormalities are identified.  IMPRESSION: 1. Mildly worsened appendicitis noted, with dilatation of the appendix to 1.1 cm in diameter, and mildly increased surrounding soft tissue inflammation, extending along the mesentery. Few mildly prominent adjacent lymph nodes seen. No evidence of perforation or abscess formation. Given that mild appendicitis was noted on the CT from February, this appears to reflect gradually worsening somewhat chronic appendicitis. 2. Previously noted mid ileal bowel inflammation has resolved; this likely reflected an acute infectious or inflammatory ileal process. The distal ileum is unremarkable in appearance; there is no evidence of terminal ileitis. 3. 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 This measures up to 3.3 cm in transverse dimension. Mild associated mural thrombus noted. Recommend followup by ultrasound in 3 years. This recommendation follows ACR consensus guidelines: White Paper of the ACR Incidental Findings Committee II on Vascular Findings. J Am Coll Radiol 2013; 16:109-60410:789-794 4. 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 Spleen otherwise unremarkable appearance. 5. Centrally increased prostatic enhancement again  noted, though the prostate remains normal in size. Would correlate with PSA, to assess for underlying mass, and consider further evaluation as deemed clinically appropriate.  These results were called by telephone at the time of interpretation on 11/26/2014 at 10:23 pm to the physician at [facility], who verbally acknowledged these results.   Electronically Signed   By: Roanna RaiderJeffery  Chang M.D.   On: 11/26/2014 22:23    Anti-infectives: Anti-infectives    Start     Dose/Rate Route Frequency Ordered Stop   11/26/14 2215  piperacillin-tazobactam (ZOSYN) IVPB 3.375 g     3.375 g 12.5 mL/hr over 240 Minutes Intravenous 3 times per day 11/26/14 2206        Assessment Acute appendicitis (s/p laparoscopic appendectomy, Dr. Michaell CowingGross 11/27/2014) HTN - Controlled with PO meds    LOS: 2 days   Plan: Advance diet, transition to PO abx and pn mgt, anticipate D/C later today.   Peyton BottomsGuertin, Lurae Hornbrook PA-S 11/28/2014

## 2014-11-28 NOTE — Progress Notes (Signed)
Patient discharge home with wife, alert and oriented, discharge instructions given, patient verbalize understanding of discharge instructions given, My Chart access declined at this time will try and access later at home, patient in stable condition at this time

## 2014-11-28 NOTE — Progress Notes (Signed)
[facility] PROGRESS NOTE  Erik Aguirre ZOX:[id number] DOB: 1941-05-20 DOA: 11/26/2014 PCP: Dow Adolph, PA  Assessment/Plan: Uncontrolled HTN On admission, likely due to pain with N/V. Pt reports being compliant with his home BP meds and his BP fairly  controlled as outpt. On reviewing his recent hospitalization 3 weeks back, his BP was faily stable on atenolol alone. i will resume his home dose of atenolol and chlorthalidone. D/c clonidine started on admission.   Acute appendicitis s/p appendectomy on 3/22  stable post op. plan per surgery.   Plan discussed with primary MD. Will sign off .  Please call for any questions   HPI/Subjective: C/O some pain over surgical site. S/p laparoscopic  appendectomy yesterday. No overnight issues  Objective: Filed Vitals:   11/28/14 0500  BP: 114/65  Pulse: 65  Temp: 97.8 F (36.6 C)  Resp: 18    Intake/Output Summary (Last 24 hours) at 11/28/14 0841 Last data filed at 11/28/14 0700  Gross per 24 hour  Intake 2826.67 ml  Output    300 ml  Net 2526.67 ml   Filed Weights   11/26/14 2300  Weight: 53 kg (116 lb 13.5 oz)    Exam:   General:  elderly male in NAD  Heent: No pallor, moist mucosa  Cardiovascular: NS1&S2, no murmurs  Respiratory: clear b/l, no added sounds  Abdomen: laproscopic site clean, BS+, non distended, mild tenderness over surgical site.  Musculoskeletal: warm, no edema  Data Reviewed: Basic Metabolic Panel:  Recent Labs Lab 11/26/14 1650 11/26/14 2202  NA 141 138  K 4.5 3.9  CL 102 102  CO2 31 29  GLUCOSE 101* 78  BUN 20 18  CREATININE 0.97 0.99  CALCIUM 9.4 9.1   Liver Function Tests:  Recent Labs Lab 11/26/14 1650  AST 15  ALT 13  ALKPHOS 41  BILITOT 0.4  PROT 6.6  ALBUMIN 3.8   No results for input(s): LIPASE, AMYLASE in the last 168 hours. No results for input(s): AMMONIA in the last 168 hours. CBC:  Recent Labs Lab 11/26/14 1701 11/26/14 2202  11/27/14 0538  WBC 6.7 5.8 5.3  NEUTROABS  --  2.9  --   HGB 13.8* 14.2 12.8*  HCT 43.4* 42.7 38.8*  MCV 94.0 92.0 93.0  PLT  --  299 280   Cardiac Enzymes: No results for input(s): CKTOTAL, CKMB, CKMBINDEX, TROPONINI in the last 168 hours. BNP (last 3 results) No results for input(s): BNP in the last 8760 hours.  ProBNP (last 3 results) No results for input(s): PROBNP in the last 8760 hours.  CBG: No results for input(s): GLUCAP in the last 168 hours.  Recent Results (from the past 240 hour(s))  Surgical pcr screen     Status: None   Collection Time: 11/27/14 12:20 AM  Result Value Ref Range Status   MRSA, PCR NEGATIVE NEGATIVE Final   Staphylococcus aureus NEGATIVE NEGATIVE Final    Comment:        The Xpert SA Assay (FDA approved for NASAL specimens in patients over 2 years of age), is one component of a comprehensive surveillance program.  Test performance has been validated by [facility] for patients greater than or equal to 42 year old. It is not intended to diagnose infection nor to guide or monitor treatment.      Studies: Ct Abdomen Pelvis W Contrast  11/26/2014   CLINICAL DATA:  Worsening subacute epigastric abdominal pain. Initial encounter.  EXAM: CT ABDOMEN AND PELVIS WITH CONTRAST  TECHNIQUE: Multidetector CT imaging of the abdomen and pelvis was performed using the standard protocol following bolus administration of intravenous contrast.  CONTRAST:  50mL OMNIPAQUE IOHEXOL 300 MG/ML SOLN, 100mL OMNIPAQUE IOHEXOL 300 MG/ML SOLN  COMPARISON:  CT of the abdomen and pelvis from 11/03/2014  FINDINGS: The visualized lung bases are clear.  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 The spleen is otherwise unremarkable. The liver is within normal limits. The gallbladder is within normal limits. The pancreas and adrenal glands are unremarkable.  The kidneys are unremarkable in appearance. There is no evidence of  hydronephrosis. No renal or ureteral stones are seen. No perinephric stranding is appreciated.  No free fluid is identified. The previously noted inflamed loop of mid ileum has resolved. The visualized small bowel is partially filled with contrast and is unremarkable in appearance. The stomach is within normal limits. No acute vascular abnormalities are seen. 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 The aneurysmal portion measures 3.3 cm in transverse dimension and 2.5 cm in AP dimension. 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 There is mild ectasia of the left common iliac artery.  The appendix is dilated, slightly more prominent than on the prior study, extending towards the midline, measuring up to 1.1 cm in diameter. Surrounding soft tissue inflammation is mildly increased, extending along the mesentery. This is concerning for mildly worsening relatively chronic appendicitis. A few mildly prominent adjacent nodes are seen.  The bladder is mildly distended and grossly unremarkable. The prostate remains normal in size, though centrally increased enhancement is again noted. Would correlate with PSA, to assess for underlying mass, and consider further evaluation as deemed clinically appropriate. No inguinal lymphadenopathy is seen.  No acute osseous abnormalities are identified.  IMPRESSION: 1. Mildly worsened appendicitis noted, with dilatation of the appendix to 1.1 cm in diameter, and mildly increased surrounding soft tissue inflammation, extending along the mesentery. Few mildly prominent adjacent lymph nodes seen. No evidence of perforation or abscess formation. Given that mild appendicitis was noted on the CT from February, this appears to reflect gradually worsening somewhat chronic appendicitis. 2. Previously noted mid ileal bowel  inflammation has resolved; this likely reflected an acute infectious or inflammatory ileal process. The distal ileum is unremarkable in appearance; there is no evidence of terminal ileitis. 3. 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 This measures up to 3.3 cm in transverse dimension. Mild associated mural thrombus noted. Recommend followup by ultrasound in 3 years. This recommendation follows ACR consensus guidelines: White Paper of the ACR Incidental Findings Committee II on Vascular Findings. J Am Coll Radiol 2013; 16:109-60410:789-794 4. 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 Spleen otherwise unremarkable appearance. 5. Centrally increased prostatic enhancement again noted, though the prostate remains normal in size. Would correlate with PSA, to assess for underlying mass, and consider further evaluation as deemed clinically appropriate.  These results were called by telephone at the time of interpretation on 11/26/2014 at 10:23 pm to the physician at [facility], who verbally acknowledged these results.   Electronically Signed   By: Roanna RaiderJeffery  Chang M.D.   On: 11/26/2014 22:23    Scheduled Meds: . atenolol  50 mg Oral Daily  And  . chlorthalidone  25 mg Oral Daily  . chlorhexidine  1 application Topical Once  . chlorhexidine  1 application Topical Once  . enoxaparin (LOVENOX) injection  40 mg Subcutaneous Q24H  . pantoprazole (PROTONIX) IV  40 mg Intravenous QHS  . piperacillin-tazobactam (ZOSYN)  IV  3.375 g Intravenous 3 times per day  . sodium chloride  3 mL Intravenous Q12H   Continuous Infusions:     Time spent: 20 minutes    Satya Buttram  [facility] Pager [phone] If 7PM-7AM, please contact night-coverage at [email or web address], password [facility] 11/28/2014, 8:41 AM  LOS: 2 days

## 2014-11-28 NOTE — Discharge Summary (Signed)
Physician Discharge Summary  Patient ID: Erik Aguirre MRN: [id number] DOB/AGE: [birth year] 74 y.o.  Admit date: 11/26/2014 Discharge date: 11/28/2014  Admitting Diagnosis: Acute appendicitis   Discharge Diagnosis Patient Active Problem List   Diagnosis Date Noted  . Acute appendicitis s/p alp appy 11/27/2014 11/26/2014  . Abdominal pain 11/03/2014  . Dyslipidemia 11/01/2014  . HTN (hypertension) 07/02/2014  . Pre-diabetes 07/02/2014  . 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 Upper airway cough syndrome 08/11/2013    Consultants IM  Imaging: Ct Abdomen Pelvis W Contrast  11/26/2014   CLINICAL DATA:  Worsening subacute epigastric abdominal pain. Initial encounter.  EXAM: CT ABDOMEN AND PELVIS WITH CONTRAST  TECHNIQUE: Multidetector CT imaging of the abdomen and pelvis was performed using the standard protocol following bolus administration of intravenous contrast.  CONTRAST:  50mL OMNIPAQUE IOHEXOL 300 MG/ML SOLN, OMNIPAQUE IOHEXOL 300 MG/ML SOLN  COMPARISON:  CT of the abdomen and pelvis from 11/03/2014  FINDINGS: The visualized lung bases are clear.  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 The spleen is otherwise unremarkable. The liver is within normal limits. The gallbladder is within normal limits. The pancreas and adrenal glands are unremarkable.  The kidneys are unremarkable in appearance. There is no evidence of hydronephrosis. No renal or ureteral stones are seen. No perinephric stranding is appreciated.  No free fluid is identified. The previously noted inflamed loop of mid ileum has resolved. The visualized small bowel is partially filled with contrast and is unremarkable in appearance. The stomach is within normal limits. No acute vascular abnormalities are seen. There is mild aneurysmal dilatation of the distal abdominal aorta, just above the aortic bifurcation, with scattered calcification along the abdominal aorta and  its branches. The aneurysmal portion measures 3.3 cm in transverse dimension and 2.5 cm in AP dimension. 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 There is mild ectasia of the left common iliac artery.  The appendix is dilated, slightly more prominent than on the prior study, extending towards the midline, measuring up to 1.1 cm in diameter. Surrounding soft tissue inflammation is mildly increased, extending along the mesentery. This is concerning for mildly worsening relatively chronic appendicitis. A few mildly prominent adjacent nodes are seen.  The bladder is mildly distended and grossly unremarkable. The prostate remains normal in size, though centrally increased enhancement is again noted. Would correlate with PSA, to assess for underlying mass, and consider further evaluation as deemed clinically appropriate. No inguinal lymphadenopathy is seen.  No acute osseous abnormalities are identified.  IMPRESSION: 1. Mildly worsened appendicitis noted, with dilatation of the appendix to 1.1 cm in diameter, and mildly increased surrounding soft tissue inflammation, extending along the mesentery. Few mildly prominent adjacent lymph nodes seen. No evidence of perforation or abscess formation. Given that mild appendicitis was noted on the CT from February, this appears to reflect gradually worsening somewhat chronic appendicitis. 2. Previously noted mid ileal bowel inflammation has resolved; this likely reflected an acute infectious or inflammatory ileal process. The distal ileum is unremarkable in appearance; there is no evidence of terminal ileitis. 3. 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 This measures up to 3.3 cm in transverse dimension. Mild associated mural thrombus noted. Recommend followup by ultrasound in 3 years. This recommendation follows ACR  consensus guidelines: White Paper of the ACR Incidental Findings Committee II on  Vascular Findings. J Am Coll Radiol 2013; 16:109-604 4. 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 Spleen otherwise unremarkable appearance. 5. Centrally increased prostatic enhancement again noted, though the prostate remains normal in size. Would correlate with PSA, to assess for underlying mass, and consider further evaluation as deemed clinically appropriate.  These results were called by telephone at the time of interpretation on 11/26/2014 at 10:23 pm to the physician at [facility], who verbally acknowledged these results.   Electronically Signed   By: Roanna Raider M.D.   On: 11/26/2014 22:23    Procedures Laparoscopic appendectomy---Dr. Chelsea Aus Course:  Erik Aguirre is a 74 year old male with a 3 day history of persistent, mild lower abdominal pain. CT scan was performed. He appeared to have more inflammatory changes around the tip of the appendix and then the prior CT scan which was done on 11/03/2014. He was admitted at that time with a diagnosis of terminal ileitis and possible appendicitis/mucocele. His appendix was dilated at that time. He responded to antibiotics. He was discharged and was to have a planned follow-up with Dr. Michaell Cowing to schedule elective appendectomy. He finished his antibiotics 10 days ago and pain returned 3 days later.  A repeat CT scan showed appendicitis.  Patient was admitted and underwent procedure listed above.  Tolerated procedure well and was transferred to the floor.  Diet was advanced as tolerated.  On POD#1, the patient was voiding well, tolerating diet, ambulating well, pain well controlled, vital signs stable, incisions c/d/i and felt stable for discharge home. Medication risks, benefits and therapeutic alternatives were reviewed with the patient.  He verbalizes understanding.  Patient will follow up in our office in 3 weeks and  knows to call with questions or concerns.     Medication List    STOP taking these medications        metroNIDAZOLE 500 MG tablet  Commonly known as:  FLAGYL      TAKE these medications        acetaminophen 325 MG tablet  Commonly known as:  TYLENOL  Take 1-2 tablets (325-650 mg total) by mouth every 6 (six) hours as needed for fever, headache, mild pain or moderate pain.     amoxicillin-clavulanate 875-125 MG per tablet  Commonly known as:  AUGMENTIN  Take 1 tablet by mouth 2 (two) times daily.     atenolol-chlorthalidone 50-25 MG per tablet  Commonly known as:  TENORETIC  Take 1 tablet by mouth daily.     benzonatate 100 MG capsule  Commonly known as:  TESSALON  Take 1 capsule (100 mg total) by mouth 3 (three) times daily as needed for cough.     HYDROcodone-acetaminophen 5-325 MG per tablet  Commonly known as:  NORCO/VICODIN  Take 1-2 tablets by mouth every 6 (six) hours as needed for moderate pain or severe pain.     naproxen 500 MG tablet  Commonly known as:  NAPROSYN  Take 1 tablet (500 mg total) by mouth every 12 (twelve) hours as needed for headache.     pravastatin 20 MG tablet  Commonly known as:  PRAVACHOL  Take 1 tablet (20 mg total) by mouth daily.             Follow-up Information    Follow up with [facility] On 12/18/2014.   Specialty:  General Surgery   Why:  arrive by 2:30PM for a 3PM post op check   Contact information:  [address] [place] [address] [phone]       Signed: Ashok Norrismina Shaquila Sigman, [facility] [phone]  11/28/2014, 12:07 PM

## 2014-12-03 ENCOUNTER — Encounter: Payer: Self-pay | Admitting: Family Medicine

## 2014-12-11 ENCOUNTER — Telehealth: Payer: Self-pay

## 2014-12-11 NOTE — Telephone Encounter (Signed)
Pt has a ct scan scheduled for 02/01/15 and he will need have his bun and creatine redone before then this lab is only good for 6 weeks of the scan

## 2014-12-18 ENCOUNTER — Telehealth: Payer: Self-pay | Admitting: Family Medicine

## 2014-12-18 NOTE — Telephone Encounter (Signed)
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 [facility] but has not been able to reach him.  Advised that I do not have any other contact numbers for him, but would suggest that we try both numbers and I will send him a certified letter asking him to follow--up asap

## 2015-01-31 ENCOUNTER — Other Ambulatory Visit: Payer: Self-pay

## 2015-01-31 DIAGNOSIS — R911 Solitary pulmonary nodule: Secondary | ICD-10-CM

## 2015-02-01 ENCOUNTER — Ambulatory Visit
Admission: RE | Admit: 2015-02-01 | Discharge: 2015-02-01 | Disposition: A | Discharge status: Home / Self Care | Payer: Commercial Managed Care - HMO | Source: Ambulatory Visit | Attending: Family Medicine | Admitting: Family Medicine

## 2015-02-01 DIAGNOSIS — R911 Solitary pulmonary nodule: Secondary | ICD-10-CM

## 2015-02-06 ENCOUNTER — Encounter: Payer: Self-pay | Admitting: Family Medicine

## 2015-02-06 ENCOUNTER — Other Ambulatory Visit: Payer: Self-pay | Admitting: Family Medicine

## 2015-02-06 DIAGNOSIS — R918 Other nonspecific abnormal finding of lung field: Secondary | ICD-10-CM

## 2015-02-06 DIAGNOSIS — R911 Solitary pulmonary nodule: Secondary | ICD-10-CM

## 2015-06-21 ENCOUNTER — Ambulatory Visit (INDEPENDENT_AMBULATORY_CARE_PROVIDER_SITE_OTHER): Payer: Commercial Managed Care - HMO | Admitting: Emergency Medicine

## 2015-06-21 VITALS — BP 124/80 | HR 60 | Temp 97.5°F | Resp 16 | Ht 66.0 in | Wt 122.0 lb

## 2015-06-21 DIAGNOSIS — Z23 Encounter for immunization: Secondary | ICD-10-CM

## 2015-06-21 DIAGNOSIS — R972 Elevated prostate specific antigen [PSA]: Secondary | ICD-10-CM

## 2015-06-21 DIAGNOSIS — E785 Hyperlipidemia, unspecified: Secondary | ICD-10-CM | POA: Diagnosis not present

## 2015-06-21 DIAGNOSIS — I10 Essential (primary) hypertension: Secondary | ICD-10-CM

## 2015-06-21 DIAGNOSIS — R300 Dysuria: Secondary | ICD-10-CM

## 2015-06-21 LAB — LIPID PANEL
Cholesterol: 166 mg/dL (ref 125–200)
HDL: 38 mg/dL — AB (ref >=40)
LDL CALC: 95 mg/dL (ref <130)
Total CHOL/HDL Ratio: 4.4 Ratio (ref <=5.0)
Triglycerides: 167 mg/dL — ABNORMAL HIGH (ref <150)
VLDL: 33 mg/dL — ABNORMAL HIGH (ref <30)

## 2015-06-21 LAB — COMPREHENSIVE METABOLIC PANEL
ALK PHOS: 52 U/L (ref 40–115)
ALT: 16 U/L (ref 9–46)
AST: 16 U/L (ref 10–35)
Albumin: 3.8 g/dL (ref 3.6–5.1)
BILIRUBIN TOTAL: 1.1 mg/dL (ref 0.2–1.2)
BUN: 20 mg/dL (ref 7–25)
CO2: 31 mmol/L (ref 20–31)
CREATININE: 1.09 mg/dL (ref 0.70–1.18)
Calcium: 9.4 mg/dL (ref 8.6–10.3)
Chloride: 101 mmol/L (ref 98–110)
GLUCOSE: 111 mg/dL — AB (ref 65–99)
Potassium: 4.3 mmol/L (ref 3.5–5.3)
Sodium: 140 mmol/L (ref 135–146)
Total Protein: 6.4 g/dL (ref 6.1–8.1)

## 2015-06-21 LAB — CBC
HEMATOCRIT: 45.5 % (ref 39.0–52.0)
Hemoglobin: 15.1 g/dL (ref 13.0–17.0)
MCH: 30.5 pg (ref 26.0–34.0)
MCHC: 33.2 g/dL (ref 30.0–36.0)
MCV: 91.9 fL (ref 78.0–100.0)
MPV: 9.3 fL (ref 8.6–12.4)
Platelets: 275 10*3/uL (ref 150–400)
RBC: 4.95 MIL/uL (ref 4.22–5.81)
RDW: 14.7 % (ref 11.5–15.5)
WBC: 7 10*3/uL (ref 4.0–10.5)

## 2015-06-21 LAB — POCT URINALYSIS DIP (MANUAL ENTRY)
BILIRUBIN UA: NEGATIVE
Glucose, UA: NEGATIVE
Ketones, POC UA: NEGATIVE
LEUKOCYTES UA: NEGATIVE
Nitrite, UA: NEGATIVE
PH UA: 6.5
Protein Ur, POC: NEGATIVE
Spec Grav, UA: 1.015
Urobilinogen, UA: 0.2

## 2015-06-21 LAB — POC MICROSCOPIC URINALYSIS (UMFC): MUCUS RE: ABSENT

## 2015-06-21 MED ORDER — ATENOLOL-CHLORTHALIDONE 50-25 MG PO TABS: 1.0000 | ORAL_TABLET | Freq: Every day | ORAL | Status: DC

## 2015-06-21 MED ORDER — TAMSULOSIN HCL 0.4 MG PO CAPS: 0.4000 mg | ORAL_CAPSULE | Freq: Every day | ORAL | Status: DC

## 2015-06-21 NOTE — Progress Notes (Signed)
Subjective:  Patient ID: Erik Aguirre, male    DOB: [birth year]  Age: 74 y.o. MRN: [id number]  CC: Follow-up; Immunizations; and Urinary Frequency   HPI Erik Aguirre presents   With nocturia and dysuria. He says hegets up or 5 times a night he has hesitancy and decreased force of stream. He has no urgency or frequency. He denies any fever or chills. Denies any abdominal pain or back pain. 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   He has past surgical history that includes laparoscopic appendectomy (N/A, 11/27/2014).   His  family history includes Asthma in his son; Throat cancer in his brother.  He   reports that he has never smoked. He has never used smokeless tobacco. He reports that he does not drink alcohol or use illicit drugs.  Outpatient Prescriptions Prior to Visit  Medication Sig Dispense Refill  . acetaminophen (TYLENOL) 325 MG tablet Take 1-2 tablets (325-650 mg total) by mouth every 6 (six) hours as needed for fever, headache, mild pain or moderate pain.    Marland Kitchen atenolol-chlorthalidone (TENORETIC) 50-25 MG per tablet Take 1 tablet by mouth daily. 90 tablet 3  . benzonatate (TESSALON) 100 MG capsule Take 1 capsule (100 mg total) by mouth 3 (three) times daily as needed for cough. (Patient not taking: Reported on 11/03/2014) 40 capsule 0  . HYDROcodone-acetaminophen (NORCO/VICODIN) 5-325 MG per tablet Take 1-2 tablets by mouth every 6 (six) hours as needed for moderate pain or severe pain. (Patient not taking: Reported on 06/21/2015) 30 tablet 0  . naproxen (NAPROSYN) 500 MG tablet Take 1 tablet (500 mg total) by mouth every 12 (twelve) hours as needed for headache. (Patient not taking: Reported on 06/21/2015)    . pravastatin (PRAVACHOL) 20 MG tablet Take 1 tablet (20 mg total) by mouth daily. (Patient not taking: Reported on 11/26/2014) 90 tablet 3   No facility-administered  medications prior to visit.    Social History   Social History  . Marital Status: Married    Spouse Name: N/A  . Number of Children: N/A  . Years of Education: N/A   Occupational History  . Retired    Social History Main Topics  . Smoking status: Never Smoker   . Smokeless tobacco: Never Used  . Alcohol Use: No  . Drug Use: No  . Sexual Activity: Not Asked   Other Topics Concern  . None   Social History Narrative   Originally from [place].   Language of English okay     Review of Systems  Constitutional: Negative for fever, chills and appetite change.  HENT: Negative for congestion, ear pain, postnasal drip, sinus pressure and sore throat.   Eyes: Negative for pain and redness.  Respiratory: Negative for cough, shortness of breath and wheezing.   Cardiovascular: Negative for leg swelling.  Gastrointestinal: Negative for nausea, vomiting, abdominal pain, diarrhea, constipation and blood in stool.  Endocrine: Negative for polyuria.  Genitourinary: Positive for dysuria and urgency. Negative for frequency and flank pain.  Musculoskeletal: Negative for gait problem.  Skin: Negative for rash.  Neurological: Negative for weakness and headaches.  Psychiatric/Behavioral: Negative for confusion and decreased concentration. The patient is not nervous/anxious.     Objective:  BP 124/80 mmHg  Pulse 60  Temp(Src) 97.5 F (36.4 C) (Oral)  Resp 16  Ht  (1.676 m)  Wt 122 lb (55.339 kg)  BMI 19.70 kg/m2  SpO2 93%  Physical Exam  Constitutional: He is oriented to person, place, and time. He appears well-developed and well-nourished. No distress.  HENT:  Head: Normocephalic and atraumatic.  Right Ear: External ear normal.  Left Ear: External ear normal.  Nose: Nose normal.  Eyes: Conjunctivae and EOM are normal. Pupils are equal, round, and reactive to light. No scleral icterus.  Neck: Normal range of motion. Neck supple. No tracheal deviation  present.  Cardiovascular: Normal rate, regular rhythm and normal heart sounds.   Pulmonary/Chest: Effort normal. No respiratory distress. He has no wheezes. He has no rales.  Abdominal: He exhibits no mass. There is no tenderness. There is no rebound and no guarding.  Musculoskeletal: He exhibits no edema.  Lymphadenopathy:    He has no cervical adenopathy.  Neurological: He is alert and oriented to person, place, and time. Coordination normal.  Skin: Skin is warm and dry. No rash noted.  Psychiatric: He has a normal mood and affect. His behavior is normal.      Assessment & Plan:   Loanne DrillingKien was seen today for follow-up, immunizations and urinary frequency.  Diagnoses and all orders for this visit:  Dysuria -     POCT urinalysis dipstick -     POCT Microscopic Urinalysis ([facility]) -     CBC -     Comprehensive metabolic panel -     Lipid panel -     PSA  Essential hypertension -     CBC -     Comprehensive metabolic panel -     Lipid panel -     PSA -     atenolol-chlorthalidone (TENORETIC) 50-25 MG tablet; Take 1 tablet by mouth daily.  Dyslipidemia -     CBC -     Comprehensive metabolic panel -     Lipid panel -     PSA  Other orders -     tamsulosin (FLOMAX) 0.4 MG CAPS capsule; Take 1 capsule (0.4 mg total) by mouth daily.   I have discontinued Mr. Prevost's benzonatate, pravastatin, HYDROcodone-acetaminophen, and naproxen. I have also changed his atenolol-chlorthalidone. Additionally, I am having him start on tamsulosin. Lastly, I am having him maintain his acetaminophen and multivitamin.  Meds ordered this encounter  Medications  . Multiple Vitamin (MULTIVITAMIN) capsule    Sig: Take 1 capsule by mouth daily.  Marland Kitchen. atenolol-chlorthalidone (TENORETIC) 50-25 MG tablet    Sig: Take 1 tablet by mouth daily.    Dispense:  90 tablet    Refill:  3  . tamsulosin (FLOMAX) 0.4 MG CAPS capsule    Sig: Take 1 capsule (0.4 mg total) by mouth daily.    Dispense:  90 capsule     Refill:  3    Appropriate red flag conditions were discussed with the patient as well as actions that should be taken.  Patient expressed his understanding.  Follow-up: Return if symptoms worsen or fail to improve.  Carmelina DaneAnderson, Magaby Rumberger S, MD   Results for orders placed or performed in visit on 06/21/15  POCT urinalysis dipstick  Result Value Ref Range   Color, UA yellow yellow   Clarity, UA clear clear   Glucose, UA negative negative   Bilirubin, UA negative negative   Ketones, POC UA negative negative   Spec Grav, UA 1.015    Blood, UA trace-intact (A) negative   pH, UA 6.5    Protein Ur, POC negative negative   Urobilinogen, UA 0.2    Nitrite,  UA Negative Negative   Leukocytes, UA Negative Negative  POCT Microscopic Urinalysis ([facility])  Result Value Ref Range   WBC,UR,HPF,POC None None WBC/hpf   RBC,UR,HPF,POC None None RBC/hpf   Bacteria None None   Mucus Absent Absent   Epithelial Cells, UR Per Microscopy None None cells/hpf

## 2015-06-21 NOTE — Patient Instructions (Addendum)
Hypertension Hypertension, commonly called high blood pressure, is when the force of blood pumping through your arteries is too strong. Your arteries are the blood vessels that carry blood from your heart throughout your body. A blood pressure reading consists of a higher number over a lower number, such as 110/72. The higher number (systolic) is the pressure inside your arteries when your heart pumps. The lower number (diastolic) is the pressure inside your arteries when your heart relaxes. Ideally you want your blood pressure below 120/80. Hypertension forces your heart to work harder to pump blood. Your arteries may become narrow or stiff. Having untreated or uncontrolled hypertension can cause heart attack, stroke, kidney disease, and other problems. RISK FACTORS Some risk factors for high blood pressure are controllable. Others are not.  Risk factors you cannot control include:   Race. You may be at higher risk if you are African American.  Age. Risk increases with age.  Gender. Men are at higher risk than women before age 45 years. After age 65, women are at higher risk than men. Risk factors you can control include:  Not getting enough exercise or physical activity.  Being overweight.  Getting too much fat, sugar, calories, or salt in your diet.  Drinking too much alcohol. SIGNS AND SYMPTOMS Hypertension does not usually cause signs or symptoms. Extremely high blood pressure (hypertensive crisis) may cause headache, anxiety, shortness of breath, and nosebleed. DIAGNOSIS To check if you have hypertension, your health care provider will measure your blood pressure while you are seated, with your arm held at the level of your heart. It should be measured at least twice using the same arm. Certain conditions can cause a difference in blood pressure between your right and left arms. A blood pressure reading that is higher than normal on one occasion does not mean that you need treatment. If  it is not clear whether you have high blood pressure, you may be asked to return on a different day to have your blood pressure checked again. Or, you may be asked to monitor your blood pressure at home for 1 or more weeks. TREATMENT Treating high blood pressure includes making lifestyle changes and possibly taking medicine. Living a healthy lifestyle can help lower high blood pressure. You may need to change some of your habits. Lifestyle changes may include:  Following the DASH diet. This diet is high in fruits, vegetables, and whole grains. It is low in salt, red meat, and added sugars.  Keep your sodium intake below 2,300 mg per day.  Getting at least 30-45 minutes of aerobic exercise at least 4 times per week.  Losing weight if necessary.  Not smoking.  Limiting alcoholic beverages.  Learning ways to reduce stress. Your health care provider may prescribe medicine if lifestyle changes are not enough to get your blood pressure under control, and if one of the following is true:  You are 18-59 years of age and your systolic blood pressure is above 140.  You are 60 years of age or older, and your systolic blood pressure is above 150.  Your diastolic blood pressure is above 90.  You have diabetes, and your systolic blood pressure is over 140 or your diastolic blood pressure is over 90.  You have kidney disease and your blood pressure is above 140/90.  You have heart disease and your blood pressure is above 140/90. Your personal target blood pressure may vary depending on your medical conditions, your age, and other factors. HOME CARE INSTRUCTIONS    Have your blood pressure rechecked as directed by your health care provider.   Take medicines only as directed by your health care provider. Follow the directions carefully. Blood pressure medicines must be taken as prescribed. The medicine does not work as well when you skip doses. Skipping doses also puts you at risk for  problems.  Do not smoke.   Monitor your blood pressure at home as directed by your health care provider. SEEK MEDICAL CARE IF:   You think you are having a reaction to medicines taken.  You have recurrent headaches or feel dizzy.  You have swelling in your ankles.  You have trouble with your vision. SEEK IMMEDIATE MEDICAL CARE IF:  You develop a severe headache or confusion.  You have unusual weakness, numbness, or feel faint.  You have severe chest or abdominal pain.  You vomit repeatedly.  You have trouble breathing. MAKE SURE YOU:   Understand these instructions.  Will watch your condition.  Will get help right away if you are not doing well or get worse.   This information is not intended to replace advice given to you by your health care provider. Make sure you discuss any questions you have with your health care provider.   Document Released: 08/24/2005 Document Revised: 01/08/2015 Document Reviewed: 06/16/2013 Elsevier Interactive Patient Education 2016 Elsevier Inc. Benign Prostatic Hypertrophy The prostate gland is part of the reproductive system of men. A normal prostate is about the size and shape of a walnut. The prostate gland produces a fluid that is mixed with sperm to make semen. This gland surrounds the urethra and is located in front of the rectum and just below the bladder. The bladder is where urine is stored. The urethra is the tube through which urine passes from the bladder to get out of the body. The prostate grows as a man ages. An enlarged prostate not caused by cancer is called benign prostatic hypertrophy (BPH). An enlarged prostate can press on the urethra. This can make it harder to pass urine. In the early stages of enlargement, the bladder can get by with a narrowed urethra by forcing the urine through. If the problem gets worse, medical or surgical treatment may be required.  This condition should be followed by your health care provider.  The accumulation of urine in the bladder can cause infection. Back pressure and infection can progress to bladder damage and kidney (renal) failure. If needed, your health care provider may refer you to a specialist in kidney and prostate disease (urologist). CAUSES  BPH is a common health problem in men older than 50 years. This condition is a normal part of aging. However, not all men will develop problems from this condition. If the enlargement grows away from the urethra, then there will not be any compression of the urethra and resistance to urine flow.If the growth is toward the urethra and compresses it, you will experience difficulty urinating.  SYMPTOMS   Not able to completely empty your bladder.  Getting up often during the night to urinate.  Need to urinate frequently during the day.  Difficultly starting urine flow.  Decrease in size and strength of your urine stream.  Dribbling after urination.  Pain on urination (more common with infection).  Inability to pass urine. This needs immediate treatment.  The development of a urinary tract infection. DIAGNOSIS  These tests will help your health care provider understand your problem:  A thorough history and physical examination.  A urination history, with  the number of times you urinate, the amounts of urine, the strength of the urine stream, and the feeling of emptiness or fullness after urinating.  A postvoid bladder scan that measures any amount of urine that may remain in your bladder after you finish urinating.  Digital rectal exam. In a rectal exam, your health care provider checks your prostate by putting a gloved, lubricated finger into your rectum to feel the back of your prostate gland. This exam detects the size of your gland and abnormal lumps or growths.  Exam of your urine (urinalysis).  Prostate specific antigen (PSA) screening. This is a blood test used to screen for prostate cancer.  Rectal  ultrasonography. This test uses sound waves to electronically produce a picture of your prostate gland. TREATMENT  Once symptoms begin, your health care provider will monitor your condition. Of the men with this condition, one third will have symptoms that stabilize, one third will have symptoms that improve, and one third will have symptoms that progress in the first year. Mild symptoms may not need treatment. Simple observation and yearly exams may be all that is required. Medicines and surgery are options for more severe problems. Your health care provider can help you make an informed decision for what is best. Two classes of medicines are available for relief of prostate symptoms:  Medicines that shrink the prostate. This helps relieve symptoms. These medicines take time to work, and it may be months before any improvement is seen.  Uncommon side effects include problems with sexual function.  Medicines to relax the muscle of the prostate. This also relieves the obstruction by reducing any compression on the urethra.This group of medicines work much faster than those that reduce the size of the prostate gland. Usually, one can experience improvement in days to weeks..  Side effects can include dizziness, fatigue, lightheadedness, and retrograde ejaculation (diminished volume of ejaculate). Several types of surgical treatments are available for relief of prostate symptoms:  Transurethral resection of the prostate (TURP)--In this treatment, an instrument is inserted through opening at the tip of the penis. It is used to cut away pieces of the inner core of the prostate. The pieces are removed through the same opening of the penis. This removes the obstruction and helps get rid of the symptoms.  Transurethral incision (TUIP)--In this procedure, small cuts are made in the prostate. This lessens the prostates pressure on the urethra.  Transurethral microwave thermotherapy (TUMT)--This procedure  uses microwaves to create heat. The heat destroys and removes a small amount of prostate tissue.  Transurethral needle ablation (TUNA)--This is a procedure that uses radio frequencies to do the same as TUMT.  Interstitial laser coagulation (ILC)--This is a procedure that uses a laser to do the same as TUMT and TUNA.  Transurethral electrovaporization (TUVP)--This is a procedure that uses electrodes to do the same as the procedures listed above. SEEK MEDICAL CARE IF:   You develop a fever.  There is unexplained back pain.  Symptoms are not helped by medicines prescribed.  You develop side effects from the medicine you are taking.  Your urine becomes very dark or has a bad smell.  Your lower abdomen becomes distended and you have difficulty passing your urine. SEEK IMMEDIATE MEDICAL CARE IF:   You are suddenly unable to urinate. This is an emergency. You should be seen immediately.  There are large amounts of blood or clots in the urine.  Your urinary problems become unmanageable.  You develop lightheadedness, severe dizziness,  or you feel faint.  You develop moderate to severe low back or flank pain.  You develop chills or fever.   This information is not intended to replace advice given to you by your health care provider. Make sure you discuss any questions you have with your health care provider.   Document Released: 08/24/2005 Document Revised: 08/29/2013 Document Reviewed: 03/09/2013 Elsevier Interactive Patient Education [employer]2016 Elsevier Inc.

## 2015-06-21 NOTE — Addendum Note (Signed)
Addended by: Roosvelt HarpsHARRIS, Adalyn Pennock R on: 06/21/2015 11:34 AM   Modules accepted: Orders

## 2015-06-22 LAB — PSA: PSA: 5.07 ng/mL — ABNORMAL HIGH (ref <=4.00)

## 2015-06-24 NOTE — Addendum Note (Signed)
Addended by: Carmelina DaneANDERSON, Anisha Starliper S on: 06/24/2015 08:56 AM   Modules accepted: Orders, SmartSet

## 2015-09-27 ENCOUNTER — Encounter: Payer: Self-pay | Admitting: Family Medicine

## 2015-10-02 ENCOUNTER — Encounter: Payer: Self-pay | Admitting: Family Medicine

## 2016-01-15 ENCOUNTER — Ambulatory Visit (INDEPENDENT_AMBULATORY_CARE_PROVIDER_SITE_OTHER): Payer: Medicare HMO | Admitting: Physician Assistant

## 2016-01-15 VITALS — BP 124/70 | HR 68 | Temp 97.7°F | Resp 16 | Ht 65.5 in | Wt 125.0 lb

## 2016-01-15 DIAGNOSIS — R7309 Other abnormal glucose: Secondary | ICD-10-CM | POA: Diagnosis not present

## 2016-01-15 DIAGNOSIS — E1165 Type 2 diabetes mellitus with hyperglycemia: Secondary | ICD-10-CM

## 2016-01-15 DIAGNOSIS — R7303 Prediabetes: Secondary | ICD-10-CM | POA: Diagnosis not present

## 2016-01-15 DIAGNOSIS — E785 Hyperlipidemia, unspecified: Secondary | ICD-10-CM | POA: Diagnosis not present

## 2016-01-15 DIAGNOSIS — IMO0001 Reserved for inherently not codable concepts without codable children: Secondary | ICD-10-CM

## 2016-01-15 DIAGNOSIS — R1033 Periumbilical pain: Secondary | ICD-10-CM

## 2016-01-15 DIAGNOSIS — I10 Essential (primary) hypertension: Secondary | ICD-10-CM

## 2016-01-15 LAB — LIPID PANEL
CHOL/HDL RATIO: 4.2 ratio (ref <=5.0)
CHOLESTEROL: 178 mg/dL (ref 125–200)
HDL: 42 mg/dL (ref >=40)
LDL Cholesterol: 111 mg/dL (ref <130)
Triglycerides: 127 mg/dL (ref <150)
VLDL: 25 mg/dL (ref <30)

## 2016-01-15 LAB — POCT CBC
Granulocyte percent: 73.9 %G (ref 37–80)
HCT, POC: 40.4 % — AB (ref 43.5–53.7)
HEMOGLOBIN: 14.2 g/dL (ref 14.1–18.1)
Lymph, poc: 1.7 (ref 0.6–3.4)
MCH, POC: 31.8 pg — AB (ref 27–31.2)
MCHC: 35.2 g/dL (ref 31.8–35.4)
MCV: 90.3 fL (ref 80–97)
MID (CBC): 0.5 (ref 0–0.9)
MPV: 6.9 fL (ref 0–99.8)
PLATELET COUNT, POC: 270 10*3/uL (ref 142–424)
POC Granulocyte: 6.3 (ref 2–6.9)
POC LYMPH PERCENT: 20.1 %L (ref 10–50)
POC MID %: 6 % (ref 0–12)
RBC: 4.48 M/uL — AB (ref 4.69–6.13)
RDW, POC: 13.7 %
WBC: 8.5 10*3/uL (ref 4.6–10.2)

## 2016-01-15 LAB — COMPLETE METABOLIC PANEL WITH GFR
ALT: 14 U/L (ref 9–46)
AST: 16 U/L (ref 10–35)
Albumin: 3.5 g/dL — ABNORMAL LOW (ref 3.6–5.1)
Alkaline Phosphatase: 55 U/L (ref 40–115)
BUN: 16 mg/dL (ref 7–25)
CHLORIDE: 104 mmol/L (ref 98–110)
CO2: 28 mmol/L (ref 20–31)
Calcium: 8.9 mg/dL (ref 8.6–10.3)
Creat: 1.02 mg/dL (ref 0.70–1.18)
GFR, EST AFRICAN AMERICAN: 83 mL/min (ref >=60)
GFR, EST NON AFRICAN AMERICAN: 72 mL/min (ref >=60)
Glucose, Bld: 127 mg/dL — ABNORMAL HIGH (ref 65–99)
POTASSIUM: 4.8 mmol/L (ref 3.5–5.3)
Sodium: 140 mmol/L (ref 135–146)
Total Bilirubin: 0.8 mg/dL (ref 0.2–1.2)
Total Protein: 6.4 g/dL (ref 6.1–8.1)

## 2016-01-15 LAB — HEMOGLOBIN A1C
HEMOGLOBIN A1C: 7.3 % — AB (ref <5.7)
MEAN PLASMA GLUCOSE: 163 mg/dL

## 2016-01-15 LAB — LIPASE: Lipase: 8 U/L (ref 7–60)

## 2016-01-15 MED ORDER — ATENOLOL-CHLORTHALIDONE 50-25 MG PO TABS: 1.0000 | ORAL_TABLET | Freq: Every day | ORAL | Status: DC

## 2016-01-15 NOTE — Progress Notes (Signed)
Erik Aguirre  MRN: [id number] DOB: 11-Oct-1940  Subjective:  Pt presents to clinic with 3 day h/o periumbilical pain that is intermittent - happens several times a day and when it is hurting it is only about 5 mins.  Feels like a dull pain.  He does not think that he has had fevers or chills.  He has been exposed to no one with GI illness.  She has not had a change in his bowel habits.  He has had normal appetite, he is eating normal.    He would also like to have lab work done to check his cholesterol and his glucose.  Patient Active Problem List   Diagnosis Date Noted  . Acute appendicitis s/p alp appy 11/27/2014 11/26/2014  . Abdominal pain 11/03/2014  . Dyslipidemia 11/01/2014  . HTN (hypertension) 07/02/2014  . Pre-diabetes 07/02/2014  . 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 Upper airway cough syndrome 08/11/2013  . 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 acetaminophen (TYLENOL) 325 MG tablet Take 1-2 tablets (325-650 mg total) by mouth every 6 (six) hours as needed for fever, headache, mild pain or moderate pain. (Patient not taking: Reported on 01/15/2016)    . Multiple Vitamin (MULTIVITAMIN) capsule Take 1 capsule by mouth daily. Reported on 01/15/2016    . tamsulosin (FLOMAX) 0.4 MG CAPS capsule Take 1 capsule (0.4 mg total) by mouth daily. (Patient not taking: Reported on 01/15/2016) 90 capsule 3   No current facility-administered medications on file prior to visit.    Allergies  Allergen Reactions  . Lisinopril Cough    Review of Systems  Constitutional: Negative for fever and chills.  Gastrointestinal: Positive for abdominal pain (periumbilical). Negative for nausea, vomiting, diarrhea, constipation and blood in stool.  Genitourinary: Negative for dysuria, urgency and frequency.   Objective:  BP 124/70 mmHg  Pulse 68  Temp(Src) 97.7 F (36.5 C) (Oral)  Resp 16  Ht 5' 5.5"  (1.664 m)  Wt 125 lb (56.7 kg)  BMI 20.48 kg/m2  SpO2 96%  Physical Exam  Constitutional: He is oriented to person, place, and time and well-developed, well-nourished, and in no distress.  HENT:  Head: Normocephalic and atraumatic.  Right Ear: External ear normal.  Left Ear: External ear normal.  Eyes: Conjunctivae are normal.  Neck: Normal range of motion.  Cardiovascular: Normal rate, regular rhythm and normal heart sounds.   No murmur heard. Pulmonary/Chest: Effort normal and breath sounds normal. He has no wheezes.  Abdominal: Soft. Bowel sounds are normal. There is no tenderness.  Neurological: He is alert and oriented to person, place, and time. Gait normal.  Skin: Skin is warm and dry.  Psychiatric: Mood, memory, affect and judgment normal.   Results for orders placed or performed in visit on 01/15/16  POCT CBC  Result Value Ref Range   WBC 8.5 4.6 - 10.2 K/uL   Lymph, poc 1.7 0.6 - 3.4   POC LYMPH PERCENT 20.1 10 - 50 %L   MID (cbc) 0.5 0 - 0.9   POC MID % 6.0 0 - 12 %M   POC Granulocyte 6.3 2 - 6.9   Granulocyte percent 73.9 37 - 80 %G   RBC 4.48 (A) 4.69 - 6.13 M/uL   Hemoglobin 14.2 14.1 - 18.1 g/dL   HCT, POC 78.2 (A) 95.6 - 53.7 %   MCV 90.3 80 - 97 fL   MCH, POC 31.8 (  A) 27 - 31.2 pg   MCHC 35.2 31.8 - 35.4 g/dL   RDW, POC 63.813.7 %   Platelet Count, POC 270 142 - 424 K/uL   MPV 6.9 0 - 99.8 fL    Assessment and Plan :  Pre-diabetes - Plan: Hemoglobin A1c - check labs  Dyslipidemia - Plan: COMPLETE METABOLIC PANEL WITH GFR, Lipid panel -check labs  Periumbilical abdominal pain - Plan: Lipase, POCT CBC - unsure of cause but normal CBC and the pain is intermittent and he is not currently having it - he is eating normal with normal BMs.  He will watch and wait and return to clinic if his pain changes or worsens.  Essential hypertension - Plan: atenolol-chlorthalidone (TENORETIC) 50-25 MG tablet - refilled medications  Benny LennertSarah Weber PA-C  [facility] 01/15/2016 11:04 AM

## 2016-01-15 NOTE — Patient Instructions (Signed)
     IF you received an x-ray today, you will receive an invoice from [facility]. Please contact [facility] at [phone] with questions or concerns regarding your invoice.   IF you received labwork today, you will receive an invoice from [facility]. Please contact [facility] at [phone] with questions or concerns regarding your invoice.   Our billing staff will not be able to assist you with questions regarding bills from these companies.  You will be contacted with the lab results as soon as they are available. The fastest way to get your results is to activate your My Chart account. Instructions are located on the last page of this paperwork. If you have not heard from us regarding the results in 2 weeks, please contact this office.      

## 2016-01-20 ENCOUNTER — Encounter: Payer: Self-pay | Admitting: Physician Assistant

## 2016-01-20 MED ORDER — METFORMIN HCL 500 MG PO TABS: 500.0000 mg | ORAL_TABLET | Freq: Every day | ORAL | Status: DC

## 2016-01-20 NOTE — Addendum Note (Signed)
Addended by: Morrell RiddleWEBER, Zelene Barga L on: 01/20/2016 10:08 AM   Modules accepted: Orders

## 2016-04-22 ENCOUNTER — Encounter: Payer: Self-pay | Admitting: Family Medicine

## 2016-05-01 ENCOUNTER — Other Ambulatory Visit: Payer: Self-pay | Admitting: Physician Assistant

## 2016-05-01 DIAGNOSIS — E1165 Type 2 diabetes mellitus with hyperglycemia: Principal | ICD-10-CM

## 2016-05-01 DIAGNOSIS — IMO0001 Reserved for inherently not codable concepts without codable children: Secondary | ICD-10-CM

## 2016-09-15 ENCOUNTER — Ambulatory Visit (INDEPENDENT_AMBULATORY_CARE_PROVIDER_SITE_OTHER): Payer: Medicare HMO | Admitting: Physician Assistant

## 2016-09-15 VITALS — BP 160/83 | HR 64 | Temp 97.5°F | Ht 65.5 in | Wt 127.0 lb

## 2016-09-15 DIAGNOSIS — Z79899 Other long term (current) drug therapy: Secondary | ICD-10-CM | POA: Diagnosis not present

## 2016-09-15 DIAGNOSIS — E119 Type 2 diabetes mellitus without complications: Secondary | ICD-10-CM | POA: Diagnosis not present

## 2016-09-15 DIAGNOSIS — R030 Elevated blood-pressure reading, without diagnosis of hypertension: Secondary | ICD-10-CM

## 2016-09-15 DIAGNOSIS — I10 Essential (primary) hypertension: Secondary | ICD-10-CM

## 2016-09-15 DIAGNOSIS — IMO0001 Reserved for inherently not codable concepts without codable children: Secondary | ICD-10-CM

## 2016-09-15 DIAGNOSIS — Z23 Encounter for immunization: Secondary | ICD-10-CM

## 2016-09-15 DIAGNOSIS — E1165 Type 2 diabetes mellitus with hyperglycemia: Secondary | ICD-10-CM

## 2016-09-15 LAB — POCT GLYCOSYLATED HEMOGLOBIN (HGB A1C): Hemoglobin A1C: 7

## 2016-09-15 MED ORDER — METFORMIN HCL 500 MG PO TABS: ORAL_TABLET | ORAL | 2 refills | Status: DC

## 2016-09-15 MED ORDER — METFORMIN HCL 500 MG PO TABS: ORAL_TABLET | ORAL | 1 refills | Status: DC

## 2016-09-15 MED ORDER — ATENOLOL-CHLORTHALIDONE 50-25 MG PO TABS: 1.0000 | ORAL_TABLET | Freq: Every day | ORAL | 3 refills | Status: DC

## 2016-09-15 NOTE — Patient Instructions (Addendum)
Please take your Metformin every morning. Come back to the clinic in three (3) months for recheck. Please do not stop taking this medication.   Thank you for coming in today. I hope you feel we met your needs.  Feel free to call [facility] if you have any questions or further requests.  Please consider signing up for MyChart if you do not already have it, as this is a great way to communicate with me.  Best,  Whitney McVey, PA-C  IF you received an x-ray today, you will receive an invoice from [facility]. Please contact [facility] at [phone] with questions or concerns regarding your invoice.   IF you received labwork today, you will receive an invoice from [place]. Please contact [facility] at [phone] with questions or concerns regarding your invoice.   Our billing staff will not be able to assist you with questions regarding bills from these companies.  You will be contacted with the lab results as soon as they are available. The fastest way to get your results is to activate your My Chart account. Instructions are located on the last page of this paperwork. If you have not heard from [place] regarding the results in 2 weeks, please contact this office.

## 2016-09-15 NOTE — Progress Notes (Signed)
Erik Aguirre  MRN: [id number] DOB: 1941-07-01  PCP: Abbe Amsterdam, MD  Subjective:  Pt is a 76 year old male who presents to clinic for medication management.   Pre-diabetes - He was last here 01/15/2016: Glucose 127, Hemoglobin A1C 7.3. Started on Metformin 500mg  qd on 05/02/2016. He took it for three months, but stopped. He says this is what the doctor told him to do. Denies side-effects of medication.  He eats rice, vegetables, chicken, shrimp. No sugary drinks. One teaspoon sugar with one cup of coffee every morning.  Walks 2 miles every day.  Denies abdominal pain, numbness/tingling of feet/hands, vision changes, increased thirst, increased urination.   HTN - atenolol-chlorthalidone 50-25 mg - Today's blood pressure is 160/83. He has been out of his medication for about four days. Takes home blood pressures, usually <130's. Denies headache, chest pain, palpitations, vision changes, lightheadedness.    Review of Systems  Constitutional: Negative for chills and diaphoresis.  Respiratory: Negative for cough, chest tightness, shortness of breath and wheezing.   Cardiovascular: Negative for chest pain, palpitations and leg swelling.  Gastrointestinal: Negative for diarrhea, nausea and vomiting.  Musculoskeletal: Negative for neck pain.  Neurological: Negative for dizziness, syncope, light-headedness and headaches.  Psychiatric/Behavioral: Negative for sleep disturbance. The patient is not nervous/anxious.     Patient Active Problem List   Diagnosis Date Noted  . Acute appendicitis s/p alp appy 11/27/2014 11/26/2014  . Abdominal pain 11/03/2014  . Dyslipidemia 11/01/2014  . HTN (hypertension) 07/02/2014  . Pre-diabetes 07/02/2014  . 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 Upper airway cough syndrome 08/11/2013  . 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 atenolol-chlorthalidone  (TENORETIC) 50-25 MG tablet Take 1 tablet by mouth daily. 90 tablet 3  . Multiple Vitamin (MULTIVITAMIN) capsule Take 1 capsule by mouth daily. Reported on 01/15/2016    . acetaminophen (TYLENOL) 325 MG tablet Take 1-2 tablets (325-650 mg total) by mouth every 6 (six) hours as needed for fever, headache, mild pain or moderate pain. (Patient not taking: Reported on 09/15/2016)    . metFORMIN (GLUCOPHAGE) 500 MG tablet TAKE ONE TABLET BY MOUTH ONCE DAILY WITH BREAKFAST (Patient not taking: Reported on 09/15/2016) 30 tablet 0  . tamsulosin (FLOMAX) 0.4 MG CAPS capsule Take 1 capsule (0.4 mg total) by mouth daily. (Patient not taking: Reported on 09/15/2016) 90 capsule 3   No current facility-administered medications on file prior to visit.     Allergies  Allergen Reactions  . Lisinopril Cough     Objective:  BP (!) 160/83 (BP Location: Right Arm, Patient Position: Sitting, Cuff Size: Small)   Pulse 64   Temp 97.5 F (36.4 C) (Oral)   Ht 5' 5.5" (1.664 m)   Wt 127 lb (57.6 kg)   SpO2 99%   BMI 20.81 kg/m   Physical Exam  Constitutional: He is oriented to person, place, and time and well-developed, well-nourished, and in no distress. No distress.  Cardiovascular: Normal rate, regular rhythm and normal heart sounds.   Pulmonary/Chest: Effort normal and breath sounds normal.  Neurological: He is alert and oriented to person, place, and time. GCS score is 15.  Skin: Skin is warm and dry.  Psychiatric: Mood, memory, affect and judgment normal.  Vitals reviewed.  Diabetic Foot Exam - Simple   Simple Foot Form Visual Inspection No deformities, no ulcerations, no other skin breakdown bilaterally:  Yes Sensation Testing Intact to touch  and monofilament testing bilaterally:  Yes Pulse Check Posterior Tibialis and Dorsalis pulse intact bilaterally:  Yes Comments     Results for orders placed or performed in visit on 09/15/16  POCT glycosylated hemoglobin (Hb A1C)  Result Value Ref Range    Hemoglobin A1C 7.0     Assessment and Plan :  1. Encounter for medication management 2. Uncontrolled type 2 diabetes mellitus without complication, without long-term current use of insulin (HCC) - HM Diabetes Foot Exam - POCT glycosylated hemoglobin (Hb A1C) - Basic metabolic panel - Microalbumin, urine - metFORMIN (GLUCOPHAGE) 500 MG tablet; TAKE ONE TABLET BY MOUTH ONCE DAILY WITH BREAKFAST  Dispense: 30 tablet; Refill: 2 - Pt was under the understanding that he was supposed to take Metformin for three months and stop, as this would fix his blood sugar problem. Printed out on AVS pt is to take 500mg  Metformin every morning and RTC for sugar recheck in three months. Advised do not stop taking medication. A1C is improving: 7.0 today - improved from 7.3 six months ago. 3. Essential hypertension 4. Elevated blood pressure reading - atenolol-chlorthalidone (TENORETIC) 50-25 MG tablet; Take 1 tablet by mouth daily.  Dispense: 90 tablet; Refill: 3 - Advised pt to RTC before his medication runs out, as his blood pressure reading today was 160/83. Pressures in the past are nl.   5. Need for prophylactic vaccination and inoculation against influenza - Flu Vaccine QUAD 36+ mos IM    Marco CollieWhitney Orphia Mctigue, PA-C  [facility] 09/15/2016 10:20 AM

## 2016-09-16 LAB — BASIC METABOLIC PANEL WITH GFR
BUN/Creatinine Ratio: 16 (ref 10–24)
BUN: 17 mg/dL (ref 8–27)
Glucose: 121 mg/dL — ABNORMAL HIGH (ref 65–99)

## 2016-09-16 LAB — MICROALBUMIN, URINE: Microalbumin, Urine: 3.5 ug/mL

## 2016-09-16 LAB — BASIC METABOLIC PANEL
CO2: 25 mmol/L (ref 18–29)
Calcium: 9.3 mg/dL (ref 8.6–10.2)
Chloride: 102 mmol/L (ref 96–106)
Creatinine, Ser: 1.07 mg/dL (ref 0.76–1.27)
GFR calc Af Amer: 78 mL/min/{1.73_m2} (ref >59)
GFR calc non Af Amer: 68 mL/min/{1.73_m2} (ref >59)
Potassium: 4.2 mmol/L (ref 3.5–5.2)
Sodium: 140 mmol/L (ref 134–144)

## 2016-09-30 ENCOUNTER — Encounter: Payer: Self-pay | Admitting: Physician Assistant

## 2016-10-01 DIAGNOSIS — Z6833 Body mass index (BMI) 33.0-33.9, adult: Secondary | ICD-10-CM | POA: Diagnosis not present

## 2016-10-01 DIAGNOSIS — E669 Obesity, unspecified: Secondary | ICD-10-CM | POA: Diagnosis not present

## 2016-10-01 DIAGNOSIS — I10 Essential (primary) hypertension: Secondary | ICD-10-CM | POA: Diagnosis not present

## 2016-10-01 DIAGNOSIS — H9112 Presbycusis, left ear: Secondary | ICD-10-CM | POA: Diagnosis not present

## 2016-10-01 DIAGNOSIS — E119 Type 2 diabetes mellitus without complications: Secondary | ICD-10-CM | POA: Diagnosis not present

## 2016-10-01 DIAGNOSIS — R3915 Urgency of urination: Secondary | ICD-10-CM | POA: Diagnosis not present

## 2016-10-01 DIAGNOSIS — Z7984 Long term (current) use of oral hypoglycemic drugs: Secondary | ICD-10-CM | POA: Diagnosis not present

## 2016-10-01 DIAGNOSIS — Z0000 Encounter for general adult medical examination without abnormal findings: Secondary | ICD-10-CM | POA: Diagnosis not present

## 2016-11-27 ENCOUNTER — Telehealth: Payer: Self-pay | Admitting: Physician Assistant

## 2016-12-07 ENCOUNTER — Ambulatory Visit (INDEPENDENT_AMBULATORY_CARE_PROVIDER_SITE_OTHER): Payer: Medicare HMO | Admitting: Emergency Medicine

## 2016-12-07 VITALS — BP 166/87 | HR 96 | Temp 97.3°F | Resp 14 | Wt 126.0 lb

## 2016-12-07 DIAGNOSIS — E119 Type 2 diabetes mellitus without complications: Secondary | ICD-10-CM | POA: Diagnosis not present

## 2016-12-07 DIAGNOSIS — I10 Essential (primary) hypertension: Secondary | ICD-10-CM | POA: Diagnosis not present

## 2016-12-07 LAB — POCT GLYCOSYLATED HEMOGLOBIN (HGB A1C): Hemoglobin A1C: 7.5

## 2016-12-07 MED ORDER — METFORMIN HCL 1000 MG PO TABS: 1000.0000 mg | ORAL_TABLET | Freq: Two times a day (BID) | ORAL | 3 refills | Status: DC

## 2016-12-07 NOTE — Progress Notes (Signed)
Erik Aguirre 76 y.o.   Chief Complaint  Patient presents with  . Diabetes  . Hyperlipidemia  . Hypertension    HISTORY OF PRESENT ILLNESS: This is a 76 y.o. male here for recheck of Diabetes and HTN; hasn't taken meds today; fasting. HPI   Prior to Admission medications   Medication Sig Start Date End Date Taking? Authorizing Provider  atenolol-chlorthalidone (TENORETIC) 50-25 MG tablet Take 1 tablet by mouth daily. 09/15/16  Yes Elizabeth Whitney McVey, PA-C  Multiple Vitamin (MULTIVITAMIN) capsule Take 1 capsule by mouth daily. Reported on 01/15/2016   Yes Historical Provider, MD  acetaminophen (TYLENOL) 325 MG tablet Take 1-2 tablets (325-650 mg total) by mouth every 6 (six) hours as needed for fever, headache, mild pain or moderate pain. Patient not taking: Reported on 09/15/2016 11/28/14   Ashok Norris, NP  metFORMIN (GLUCOPHAGE) 1000 MG tablet Take 1 tablet (1,000 mg total) by mouth 2 (two) times daily with a meal. 12/07/16   Georgina Quint, MD  tamsulosin (FLOMAX) 0.4 MG CAPS capsule Take 1 capsule (0.4 mg total) by mouth daily. Patient not taking: Reported on 09/15/2016 06/21/15   Carmelina Dane, MD    Allergies  Allergen Reactions  . Lisinopril Cough    Patient Active Problem List   Diagnosis Date Noted  . Acute appendicitis s/p alp appy 11/27/2014 11/26/2014  . Abdominal pain 11/03/2014  . Dyslipidemia 11/01/2014  . HTN (hypertension) 07/02/2014  . Pre-diabetes 07/02/2014  . 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 Upper airway cough syndrome 08/11/2013  . 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 Hyperlipidemia   . Hypertension   . 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 LAPAROSCOPIC APPENDECTOMY N/A 11/27/2014   Procedure: APPENDECTOMY LAPAROSCOPIC ;  Surgeon: Karie Soda, MD;  Location: [facility];  Service: General;  Laterality: N/A;    Social History   Social  History  . Marital status: Married    Spouse name: N/A  . Number of children: N/A  . Years of education: N/A   Occupational History  . Retired    Social History Main Topics  . Smoking status: Never Smoker  . Smokeless tobacco: Never Used  . Alcohol use No  . Drug use: No  . Sexual activity: Not on file   Other Topics Concern  . Not on file   Social History Narrative   Originally from [place].   Language of English okay    Family History  Problem Relation Age of Onset  . Throat cancer Brother   . Asthma Son      Review of Systems  Constitutional: Negative.  Negative for chills, fever, malaise/fatigue and weight loss.  HENT: Negative.  Negative for sore throat.   Eyes: Negative.  Negative for blurred vision and double vision.  Respiratory: Negative for cough and shortness of breath.   Cardiovascular: Negative for chest pain, palpitations and leg swelling.  Gastrointestinal: Negative for abdominal pain, diarrhea, nausea and vomiting.  Genitourinary: Negative for dysuria and hematuria.  Musculoskeletal: Negative for back pain, myalgias and neck pain.  Skin: Negative for rash.  Neurological: Negative for dizziness, sensory change, focal weakness and headaches.  Endo/Heme/Allergies: Negative.   All other systems reviewed and are negative.  Vitals:   12/07/16 1103 12/07/16 1106  BP: (!) 176/82 (!) 166/87  Pulse: 96   Resp: 14   Temp: 97.3 F (36.3  C)      Physical Exam  Constitutional: He is oriented to person, place, and time. He appears well-developed and well-nourished.  HENT:  Head: Normocephalic and atraumatic.  Nose: Nose normal.  Mouth/Throat: Oropharynx is clear and moist. No oropharyngeal exudate.  Eyes: Conjunctivae and EOM are normal. Pupils are equal, round, and reactive to light.  Neck: Normal range of motion. Neck supple. No JVD present. No thyromegaly present.  Cardiovascular: Normal rate, regular rhythm and normal heart  sounds.   No murmur heard. Pulmonary/Chest: Effort normal and breath sounds normal. He has no rales.  Abdominal: Soft. Bowel sounds are normal. He exhibits no distension. There is no tenderness.  Musculoskeletal: Normal range of motion.  Lymphadenopathy:    He has no cervical adenopathy.  Neurological: He is alert and oriented to person, place, and time. No sensory deficit. He exhibits normal muscle tone. Coordination normal.  Skin: Skin is warm and dry.  Psychiatric: He has a normal mood and affect. His behavior is normal.  Vitals reviewed.  Results for orders placed or performed in visit on 12/07/16 (from the past 24 hour(s))  POCT glycosylated hemoglobin (Hb A1C)     Status: None   Collection Time: 12/07/16 12:08 PM  Result Value Ref Range   Hemoglobin A1C 7.5      ASSESSMENT & PLAN: Kepler was seen today for diabetes, hyperlipidemia and hypertension.  Diagnoses and all orders for this visit:  Essential hypertension -     CBC with Differential/Platelet -     Comprehensive metabolic panel -     Lipid panel  Type 2 diabetes mellitus without complication, without long-term current use of insulin (HCC) -     POCT glycosylated hemoglobin (Hb A1C)  Other orders -     metFORMIN (GLUCOPHAGE) 1000 MG tablet; Take 1 tablet (1,000 mg total) by mouth 2 (two) times daily with a meal.  Metformin dose increased.  Patient Instructions       IF you received an x-ray today, you will receive an invoice from [facility]. Please contact [facility] at [phone] with questions or concerns regarding your invoice.   IF you received labwork today, you will receive an invoice from [place]. Please contact [facility] at [phone] with questions or concerns regarding your invoice.   Our billing staff will not be able to assist you with questions regarding bills from these companies.  You will be contacted with the lab results as soon as they are available. The fastest way  to get your results is to activate your My Chart account. Instructions are located on the last page of this paperwork. If you have not heard from [place] regarding the results in 2 weeks, please contact this office.     Type 2 Diabetes Mellitus, Diagnosis, Adult Type 2 diabetes (type 2 diabetes mellitus) is a long-term (chronic) disease. It may be caused by one or both of these problems:  Your body does not make enough of a hormone called insulin.  Your body does not react in a normal way to insulin that it makes. Insulin lets sugars (glucose) go into cells in the body. This gives you energy. If you have type 2 diabetes, sugars cannot get into cells. This causes high blood sugar (hyperglycemia). Your doctor will set treatment goals for you. Generally, you should have these blood sugar levels:  Before meals (preprandial): 80-130 mg/dL (9.5-6.2 mmol/L).  After meals (postprandial): below 180 mg/dL (10 mmol/L).  A1c (hemoglobin A1c) level: less than 7%. Follow these  instructions at home: Questions to Ask Your Doctor   You may want to ask these questions:  Do I need to meet with a diabetes educator?  Where can I find a support group for people with diabetes?  What equipment will I need to care for myself at home?  What diabetes medicines do I need? When should I take them?  How often do I need to check my blood sugar?  What number can I call if I have questions?  When is my next doctor's visit? General instructions   Take over-the-counter and prescription medicines only as told by your doctor.  Keep all follow-up visits as told by your doctor. This is important. Contact a doctor if:  Your blood sugar is at or above 240 mg/dL (16.1 mmol/L) for 2 days in a row.  You have been sick or have had a fever for 2 days or more and you are not getting better.  You have any of these problems for more than 6 hours:  You cannot eat or drink.  You feel sick to your stomach  (nauseous).  You throw up (vomit).  You have watery poop (diarrhea). Get help right away if:  Your blood sugar is lower than 54 mg/dL (3 mmol/L).  You get confused.  You have trouble:  Thinking clearly.  Breathing.  You have moderate or large ketone levels in your pee (urine). This information is not intended to replace advice given to you by your health care provider. Make sure you discuss any questions you have with your health care provider. Document Released: 06/02/2008 Document Revised: 01/30/2016 Document Reviewed: 09/27/2015 Elsevier Interactive Patient Education  2017 Elsevier Inc.       Edwina Barth, MD [facility]

## 2016-12-07 NOTE — Patient Instructions (Addendum)
     IF you received an x-ray today, you will receive an invoice from [facility]. Please contact [facility] at [phone] with questions or concerns regarding your invoice.   IF you received labwork today, you will receive an invoice from [facility]. Please contact [facility] at [phone] with questions or concerns regarding your invoice.   Our billing staff will not be able to assist you with questions regarding bills from these companies.  You will be contacted with the lab results as soon as they are available. The fastest way to get your results is to activate your My Chart account. Instructions are located on the last page of this paperwork. If you have not heard from us regarding the results in 2 weeks, please contact this office.      Type 2 Diabetes Mellitus, Diagnosis, Adult Type 2 diabetes (type 2 diabetes mellitus) is a long-term (chronic) disease. It may be caused by one or both of these problems:  Your body does not make enough of a hormone called insulin.  Your body does not react in a normal way to insulin that it makes. Insulin lets sugars (glucose) go into cells in the body. This gives you energy. If you have type 2 diabetes, sugars cannot get into cells. This causes high blood sugar (hyperglycemia). Your doctor will set treatment goals for you. Generally, you should have these blood sugar levels:  Before meals (preprandial): 80-130 mg/dL (4.4-7.2 mmol/L).  After meals (postprandial): below 180 mg/dL (10 mmol/L).  A1c (hemoglobin A1c) level: less than 7%. Follow these instructions at home: Questions to Ask Your Doctor   You may want to ask these questions:  Do I need to meet with a diabetes educator?  Where can I find a support group for people with diabetes?  What equipment will I need to care for myself at home?  What diabetes medicines do I need? When should I take them?  How often do I need to check my blood sugar?  What number can  I call if I have questions?  When is my next doctor's visit? General instructions   Take over-the-counter and prescription medicines only as told by your doctor.  Keep all follow-up visits as told by your doctor. This is important. Contact a doctor if:  Your blood sugar is at or above 240 mg/dL (13.3 mmol/L) for 2 days in a row.  You have been sick or have had a fever for 2 days or more and you are not getting better.  You have any of these problems for more than 6 hours:  You cannot eat or drink.  You feel sick to your stomach (nauseous).  You throw up (vomit).  You have watery poop (diarrhea). Get help right away if:  Your blood sugar is lower than 54 mg/dL (3 mmol/L).  You get confused.  You have trouble:  Thinking clearly.  Breathing.  You have moderate or large ketone levels in your pee (urine). This information is not intended to replace advice given to you by your health care provider. Make sure you discuss any questions you have with your health care provider. Document Released: 06/02/2008 Document Revised: 01/30/2016 Document Reviewed: 09/27/2015 Elsevier Interactive Patient Education  2017 Elsevier Inc.  

## 2016-12-08 LAB — COMPREHENSIVE METABOLIC PANEL
ALT: 15 IU/L (ref 0–44)
AST: 15 IU/L (ref 0–40)
Albumin/Globulin Ratio: 1.7 (ref 1.2–2.2)
Albumin: 4.1 g/dL (ref 3.5–4.8)
Alkaline Phosphatase: 56 IU/L (ref 39–117)
BUN/Creatinine Ratio: 17 (ref 10–24)
BUN: 20 mg/dL (ref 8–27)
Bilirubin Total: 0.6 mg/dL (ref 0.0–1.2)
CO2: 27 mmol/L (ref 18–29)
CREATININE: 1.17 mg/dL (ref 0.76–1.27)
Calcium: 9.3 mg/dL (ref 8.6–10.2)
Chloride: 101 mmol/L (ref 96–106)
GFR calc non Af Amer: 61 mL/min/{1.73_m2} (ref >59)
GFR, EST AFRICAN AMERICAN: 70 mL/min/{1.73_m2} (ref >59)
GLUCOSE: 127 mg/dL — AB (ref 65–99)
Globulin, Total: 2.4 g/dL (ref 1.5–4.5)
Potassium: 4.8 mmol/L (ref 3.5–5.2)
Sodium: 143 mmol/L (ref 134–144)
Total Protein: 6.5 g/dL (ref 6.0–8.5)

## 2016-12-08 LAB — LIPID PANEL
Chol/HDL Ratio: 4 ratio (ref 0.0–5.0)
Cholesterol, Total: 182 mg/dL (ref 100–199)
HDL: 45 mg/dL (ref >39)
LDL CALC: 106 mg/dL — AB (ref 0–99)
Triglycerides: 153 mg/dL — ABNORMAL HIGH (ref 0–149)
VLDL CHOLESTEROL CAL: 31 mg/dL (ref 5–40)

## 2016-12-08 LAB — CBC WITH DIFFERENTIAL/PLATELET
Basophils Absolute: 0 10*3/uL (ref 0.0–0.2)
Basos: 0 %
EOS (ABSOLUTE): 0.1 10*3/uL (ref 0.0–0.4)
Eos: 2 %
HEMATOCRIT: 43.6 % (ref 37.5–51.0)
Hemoglobin: 14 g/dL (ref 13.0–17.7)
IMMATURE GRANS (ABS): 0 10*3/uL (ref 0.0–0.1)
Immature Granulocytes: 0 %
LYMPHS: 34 %
Lymphocytes Absolute: 2.3 10*3/uL (ref 0.7–3.1)
MCH: 29.7 pg (ref 26.6–33.0)
MCHC: 32.1 g/dL (ref 31.5–35.7)
MCV: 93 fL (ref 79–97)
MONOCYTES: 6 %
Monocytes Absolute: 0.4 10*3/uL (ref 0.1–0.9)
Neutrophils Absolute: 4 10*3/uL (ref 1.4–7.0)
Neutrophils: 58 %
Platelets: 276 10*3/uL (ref 150–379)
RBC: 4.71 x10E6/uL (ref 4.14–5.80)
RDW: 14.3 % (ref 12.3–15.4)
WBC: 6.8 10*3/uL (ref 3.4–10.8)

## 2017-05-28 ENCOUNTER — Ambulatory Visit (INDEPENDENT_AMBULATORY_CARE_PROVIDER_SITE_OTHER): Payer: Medicare HMO | Admitting: Family Medicine

## 2017-05-28 ENCOUNTER — Encounter: Payer: Self-pay | Admitting: Family Medicine

## 2017-05-28 VITALS — BP 125/80 | HR 67 | Temp 97.5°F | Resp 16 | Ht 65.5 in | Wt 116.0 lb

## 2017-05-28 DIAGNOSIS — R634 Abnormal weight loss: Secondary | ICD-10-CM

## 2017-05-28 DIAGNOSIS — N401 Enlarged prostate with lower urinary tract symptoms: Secondary | ICD-10-CM

## 2017-05-28 DIAGNOSIS — Z23 Encounter for immunization: Secondary | ICD-10-CM

## 2017-05-28 DIAGNOSIS — E119 Type 2 diabetes mellitus without complications: Secondary | ICD-10-CM

## 2017-05-28 DIAGNOSIS — R3915 Urgency of urination: Secondary | ICD-10-CM

## 2017-05-28 LAB — POCT GLYCOSYLATED HEMOGLOBIN (HGB A1C): Hemoglobin A1C: 6.8

## 2017-05-28 MED ORDER — TERAZOSIN HCL 2 MG PO CAPS: 4.0000 mg | ORAL_CAPSULE | Freq: Every day | ORAL | 2 refills | Status: DC

## 2017-05-28 NOTE — Patient Instructions (Signed)
     IF you received an x-ray today, you will receive an invoice from [facility]. Please contact [facility] at [phone] with questions or concerns regarding your invoice.   IF you received labwork today, you will receive an invoice from [facility]. Please contact [facility] at [phone] with questions or concerns regarding your invoice.   Our billing staff will not be able to assist you with questions regarding bills from these companies.  You will be contacted with the lab results as soon as they are available. The fastest way to get your results is to activate your My Chart account. Instructions are located on the last page of this paperwork. If you have not heard from us regarding the results in 2 weeks, please contact this office.     

## 2017-05-28 NOTE — Progress Notes (Signed)
9/21/20181:02 PM  Khalib Fendley [birth year], 76 y.o. male [id number]  Chief Complaint  Patient presents with  . Diabetes    DM check  . Medication Refill    metforin    HPI:   Patient is a 76 y.o. male with past medical history significant for DM2 and BPH who presents today for 6 month fu.  Last a1c 7.5, metformin was increased to  BID but this caused diarrhea so patient decreased to  once a day. He checks his cbgs about once a week, normally 120-130s range. Denies symptoms of hypoglycemia. He follows a healthy diet and exercises 3-4 x a week.    Patient reports increased urgency and urinary incontinence for past year. Rx tamsulosin years ago, low dose was not very beneficial so he stopped taking it. He denies any dysuria, hematuria or retention. He has never smoked.   His main concern is weight loss, he has lost 10 lbs since his last visit without trying to lose weight. He reports a good appetite. He denies abd pain, nausea, vomiting, diarrhea. He has occasional mild constipation. He reports a normal colonoscopy in 2013.   Depression screen [facility] 2/9 05/28/2017 09/15/2016 01/15/2016  Decreased Interest 0 0 0  Down, Depressed, Hopeless 0 0 0  PHQ - 2 Score 0 0 0    Allergies  Allergen Reactions  . Lisinopril Cough    Current Outpatient Prescriptions on File Prior to Visit  Medication Sig Dispense Refill  . atenolol-chlorthalidone (TENORETIC) 50-25 MG tablet Take 1 tablet by mouth daily. 90 tablet 3  . metFORMIN (GLUCOPHAGE) 1000 MG tablet Take 1 tablet (1,000 mg total) by mouth 2 (two) times daily with a meal. 180 tablet 3  . Multiple Vitamin (MULTIVITAMIN) capsule Take 1 capsule by mouth daily. Reported on 01/15/2016     No current facility-administered medications on file prior to visit.     Past Medical History:  Diagnosis Date  . Acute appendicitis s/p alp appy 11/27/2014 11/26/2014  . Hyperlipidemia   . Hypertension   . 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 LAPAROSCOPIC APPENDECTOMY N/A 11/27/2014   Procedure: APPENDECTOMY LAPAROSCOPIC ;  Surgeon: Karie Soda, MD;  Location: [facility];  Service: General;  Laterality: N/A;    Social History  Substance Use Topics  . Smoking status: Never Smoker  . Smokeless tobacco: Never Used  . Alcohol use No    Family History  Problem Relation Age of Onset  . Throat cancer Brother   . Asthma Son     Review of Systems  Constitutional: Positive for weight loss. Negative for chills, fever and malaise/fatigue.  HENT: Positive for hearing loss. Negative for congestion, ear pain and sore throat.   Respiratory: Negative for cough, hemoptysis and shortness of breath.   Cardiovascular: Negative for chest pain, palpitations and leg swelling.  Gastrointestinal: Negative for abdominal pain, blood in stool, melena, nausea and vomiting.  Genitourinary: Positive for frequency and urgency. Negative for dysuria and hematuria.  Musculoskeletal: Negative for myalgias.  Neurological: Negative for weakness.     OBJECTIVE:  Blood pressure 125/80, pulse 67, temperature (!) 97.5 F (36.4 C), temperature source Oral, resp. rate 16, height 5' 5.5" (1.664 m), weight 116 lb (52.6 kg), SpO2 98 %.  Physical Exam  Constitutional: He is oriented to person, place, and time and well-developed, well-nourished, and in no distress.  HENT:  Head: Normocephalic and atraumatic.  Right Ear: Hearing, tympanic membrane, external ear  and ear canal normal.  Left Ear: Hearing, tympanic membrane, external ear and ear canal normal.  Mouth/Throat: Oropharynx is clear and moist. No oropharyngeal exudate.  Eyes: Pupils are equal, round, and reactive to light. Conjunctivae and EOM are normal.  Neck: Neck supple. No thyroid mass and no thyromegaly present.  Cardiovascular: Normal rate and regular rhythm.  Exam reveals no gallop and no friction rub.   No murmur heard. Pulmonary/Chest:  Effort normal and breath sounds normal. He has no wheezes. He has no rales.  Abdominal: Soft. Bowel sounds are normal. He exhibits no distension and no mass. There is no tenderness.  Musculoskeletal: He exhibits no edema.  Lymphadenopathy:    He has no cervical adenopathy.  Neurological: He is alert and oriented to person, place, and time. Gait normal.  Skin: Skin is warm and dry.    Results for orders placed or performed in visit on 05/28/17  POCT glycosylated hemoglobin (Hb A1C)  Result Value Ref Range   Hemoglobin A1C 6.8      ASSESSMENT and PLAN:  1. Need for influenza vaccination Given today - Flu Vaccine QUAD 36+ mos IM  2. Diabetes mellitus with no complication (HCC) A1c at goal. Pneumovax 23 given today Continue current regime - POCT glycosylated hemoglobin (Hb A1C)  3. Loss of weight 8% weight loss in 6 months. Patient with benign exam an ROS. Will start with basic labs. Discussed increasing calories, adding a supplemental drink. Re-evaluate at next visit. Consider CT chest/abd/pelvis w/wo for eval of occult malignancy.  - CBC with Differential - Comprehensive metabolic panel - TSH   4. Benign prostatic hyperplasia (BPH) with urinary urgency Starting terasozin  at bedtime, discussed titrating to  after 2 weeks if needed. Discussed new med r/se/b. -PSA        Corean Yoshimura Guadalupe Dawn, MD Primary Care at [facility] [address] [place], [place] [address] Ph.  [phone] Fax [phone]

## 2017-05-29 ENCOUNTER — Ambulatory Visit (INDEPENDENT_AMBULATORY_CARE_PROVIDER_SITE_OTHER): Payer: Medicare HMO | Admitting: Family Medicine

## 2017-05-29 ENCOUNTER — Encounter: Payer: Self-pay | Admitting: Family Medicine

## 2017-05-29 VITALS — BP 101/59 | HR 62 | Temp 97.5°F | Resp 16 | Ht 65.0 in | Wt 117.0 lb

## 2017-05-29 DIAGNOSIS — I952 Hypotension due to drugs: Secondary | ICD-10-CM | POA: Diagnosis not present

## 2017-05-29 DIAGNOSIS — N401 Enlarged prostate with lower urinary tract symptoms: Secondary | ICD-10-CM

## 2017-05-29 DIAGNOSIS — R3915 Urgency of urination: Secondary | ICD-10-CM

## 2017-05-29 DIAGNOSIS — R42 Dizziness and giddiness: Secondary | ICD-10-CM

## 2017-05-29 DIAGNOSIS — I10 Essential (primary) hypertension: Secondary | ICD-10-CM | POA: Diagnosis not present

## 2017-05-29 DIAGNOSIS — T50905A Adverse effect of unspecified drugs, medicaments and biological substances, initial encounter: Secondary | ICD-10-CM

## 2017-05-29 DIAGNOSIS — T887XXA Unspecified adverse effect of drug or medicament, initial encounter: Secondary | ICD-10-CM

## 2017-05-29 LAB — CBC WITH DIFFERENTIAL/PLATELET
Basophils Absolute: 0 10*3/uL (ref 0.0–0.2)
Basos: 0 %
EOS (ABSOLUTE): 0.2 10*3/uL (ref 0.0–0.4)
Eos: 3 %
Hematocrit: 43.9 % (ref 37.5–51.0)
Hemoglobin: 14.3 g/dL (ref 13.0–17.7)
Immature Grans (Abs): 0 10*3/uL (ref 0.0–0.1)
Immature Granulocytes: 0 %
Lymphocytes Absolute: 2 10*3/uL (ref 0.7–3.1)
Lymphs: 34 %
MCH: 30.4 pg (ref 26.6–33.0)
MCHC: 32.6 g/dL (ref 31.5–35.7)
MCV: 93 fL (ref 79–97)
Monocytes Absolute: 0.3 10*3/uL (ref 0.1–0.9)
Monocytes: 5 %
Neutrophils Absolute: 3.6 10*3/uL (ref 1.4–7.0)
Neutrophils: 58 %
Platelets: 258 10*3/uL (ref 150–379)
RBC: 4.71 x10E6/uL (ref 4.14–5.80)
RDW: 14.5 % (ref 12.3–15.4)
WBC: 6.1 10*3/uL (ref 3.4–10.8)

## 2017-05-29 LAB — COMPREHENSIVE METABOLIC PANEL
ALT: 12 IU/L (ref 0–44)
AST: 18 IU/L (ref 0–40)
Albumin/Globulin Ratio: 2 (ref 1.2–2.2)
Albumin: 4.3 g/dL (ref 3.5–4.8)
Alkaline Phosphatase: 46 IU/L (ref 39–117)
BUN/Creatinine Ratio: 14 (ref 10–24)
BUN: 18 mg/dL (ref 8–27)
Bilirubin Total: 0.7 mg/dL (ref 0.0–1.2)
CO2: 24 mmol/L (ref 20–29)
Calcium: 9.4 mg/dL (ref 8.6–10.2)
Chloride: 101 mmol/L (ref 96–106)
Creatinine, Ser: 1.28 mg/dL — ABNORMAL HIGH (ref 0.76–1.27)
GFR calc Af Amer: 62 mL/min/{1.73_m2} (ref >59)
GFR calc non Af Amer: 54 mL/min/{1.73_m2} — ABNORMAL LOW (ref >59)
Globulin, Total: 2.2 g/dL (ref 1.5–4.5)
Glucose: 122 mg/dL — ABNORMAL HIGH (ref 65–99)
Potassium: 4.3 mmol/L (ref 3.5–5.2)
Sodium: 140 mmol/L (ref 134–144)
Total Protein: 6.5 g/dL (ref 6.0–8.5)

## 2017-05-29 LAB — PSA: Prostate Specific Ag, Serum: 4.8 ng/mL — ABNORMAL HIGH (ref 0.0–4.0)

## 2017-05-29 LAB — TSH: TSH: 2.43 u[IU]/mL (ref 0.450–4.500)

## 2017-05-29 MED ORDER — SILODOSIN 4 MG PO CAPS: 4.0000 mg | ORAL_CAPSULE | Freq: Every day | ORAL | 3 refills | Status: DC

## 2017-05-29 NOTE — Progress Notes (Signed)
Subjective:    Patient ID: Erik Aguirre, male    DOB: 06/03/1941, 76 y.o.   MRN: [id number] Chief Complaint  Patient presents with  . Hypotension    started new BP med yesterday 3 falls since yesterday evening with dizziness, denies dizzinness today, report BP of 79/45 this morning    HPI  Here today as an emergency work-in with his wife.  He was seen in the office yesterday and started on terazosin  2 tabs (=4mg ) qhs which he took last night before bed.  Last night at 4 am he went to the bathroom and he fell so dizzy he fell over and this morning it happened again at 8am - he had to he had to spend all morning laying flat and trying to drink milk which did help. Sit down - he did not hit his head. He did hit his face and his right upper lip is bruised.  Now he is feeling better. No n/v. Urinary freq at baseline.  He stopped his medicine now - did not take anything this morning (his atenolol-chlorthalidone).     Now is back to normal. Sxs resolved.  Past Medical History:  Diagnosis Date  . Acute appendicitis s/p alp appy 11/27/2014 11/26/2014  . Hyperlipidemia   . Hypertension   . 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 LAPAROSCOPIC APPENDECTOMY N/A 11/27/2014   Procedure: APPENDECTOMY LAPAROSCOPIC ;  Surgeon: Karie Soda, MD;  Location: [facility];  Service: General;  Laterality: N/A;   Current Outpatient Prescriptions on File Prior to Visit  Medication Sig Dispense Refill  . atenolol-chlorthalidone (TENORETIC) 50-25 MG tablet Take 1 tablet by mouth daily. 90 tablet 3  . metFORMIN (GLUCOPHAGE) 1000 MG tablet Take 1 tablet (1,000 mg total) by mouth 2 (two) times daily with a meal. 180 tablet 3  . Multiple Vitamin (MULTIVITAMIN) capsule Take 1 capsule by mouth daily. Reported on 01/15/2016     No current facility-administered medications on file prior to visit.    Allergies  Allergen Reactions  . Lisinopril Cough   Family History    Problem Relation Age of Onset  . Throat cancer Brother   . Asthma Son    Social History   Social History  . Marital status: Married    Spouse name: N/A  . Number of children: N/A  . Years of education: N/A   Occupational History  . Retired    Social History Main Topics  . Smoking status: Never Smoker  . Smokeless tobacco: Never Used  . Alcohol use No  . Drug use: No  . Sexual activity: Not Asked   Other Topics Concern  . None   Social History Narrative   Originally from [place].   Language of English okay   Depression screen [facility] 2/9 05/28/2017 09/15/2016 01/15/2016 06/21/2015  Decreased Interest 0 0 0 0  Down, Depressed, Hopeless 0 0 0 0  PHQ - 2 Score 0 0 0 0    eReview of Systems    sit down Objective:   Physical Exam  Constitutional: He is oriented to person, place, and time. He appears well-developed and well-nourished. No distress.  HENT:  Head: Normocephalic and atraumatic.  Negative Dix-Hallpike B.   Eyes: Pupils are equal, round, and reactive to light. Conjunctivae are normal. No scleral icterus.  Neck: Normal range of motion. Neck supple. No thyromegaly present.  Cardiovascular: Normal rate, regular rhythm, normal heart sounds and  intact distal pulses.   Pulmonary/Chest: Effort normal and breath sounds normal. No respiratory distress.  Musculoskeletal: He exhibits no edema.  Lymphadenopathy:    He has no cervical adenopathy.  Neurological: He is alert and oriented to person, place, and time.  Skin: Skin is warm and dry. He is not diaphoretic.  Psychiatric: He has a normal mood and affect. His behavior is normal.      BP (!) 101/59   Pulse 62   Temp (!) 97.5 F (36.4 C) (Oral)   Resp 16   Ht  (1.651 m)   Wt 117 lb (53.1 kg)   SpO2 99%   BMI 19.47 kg/m      Orthostatic VS for the past 24 hrs (Last 3 readings):  BP- Lying Pulse- Lying BP- Sitting Pulse- Sitting BP- Standing at 0 minutes Pulse- Standing at 0 minutes   05/29/17 1132 121/73 56 101/62 60 96/59 66    Assessment & Plan:   1. Hypotension due to drugs   2. Benign prostatic hyperplasia (BPH) with urinary urgency   3. Essential hypertension   4. Medication side effect, initial encounter   5. Dizziness and giddiness     Orders Placed This Encounter  Procedures  . Orthostatic vital signs  . POCT urinalysis dipstick  . POCT Microscopic Urinalysis (UMFC)    Meds ordered this encounter  Medications  . silodosin (RAPAFLO) 4 MG CAPS capsule    Sig: Take 1 capsule (4 mg total) by mouth daily with supper.    Dispense:  30 capsule    Refill:  3     Norberto Sorenson, M.D.  Primary Care at [facility] [address] [place], [place] [address] [phone] phone [phone] fax  06/01/17 10:34 AM

## 2017-05-29 NOTE — Patient Instructions (Addendum)
If this happens again, lay flat until it is 12 hours from the time he took the pill and drink as many fluids as you can. Drinking things with sodium in them like soda,  Sports drinks, Gatorade or Powerade, juice is very helpful  But if you don't have any of this water will do. Eat salty foods that are high in protein -  Anything with lots of soy sauce or fish sauce on it would work perfectly as well as salty snacks like beef jerky or deli meat.   If you pass out hit your head, start having weakness or numbness of one side of your face or body, or have any chest pain, tightness, pressure, or trouble breathing, call 911 immediately.    IF you received an x-ray today, you will receive an invoice from [facility]. Please contact [facility] at [phone] with questions or concerns regarding your invoice.   IF you received labwork today, you will receive an invoice from [facility]. Please contact [facility] at [phone] with questions or concerns regarding your invoice.   Our billing staff will not be able to assist you with questions regarding bills from these companies.  You will be contacted with the lab results as soon as they are available. The fastest way to get your results is to activate your My Chart account. Instructions are located on the last page of this paperwork. If you have not heard from [place] regarding the results in 2 weeks, please contact this office.      Hypotension As your heart beats, it forces blood through your body. This force is called blood pressure. If you have hypotension, you have low blood pressure. When your blood pressure is too low, you may not get enough blood to your brain. You may feel weak, feel light-headed, have a fast heartbeat, or even pass out (faint). Follow these instructions at home: Eating and drinking  Drink enough fluids to keep your pee (urine) clear or pale yellow.  Eat a healthy diet, and follow instructions from your doctor about  eating or drinking restrictions. A healthy diet includes: ? Fresh fruits and vegetables. ? Whole grains. ? Low-fat (lean) meats. ? Low-fat dairy products.  Eat extra salt only as told. Do not add extra salt to your diet unless your doctor tells you to.  Eat small meals often.  Avoid standing up quickly after you eat. Medicines  Take over-the-counter and prescription medicines only as told by your doctor. ? Follow instructions from your doctor about changing how much you take (the dosage) of your medicines, if this applies. ? Do not stop or change your medicine on your own. General instructions  Wear compression stockings as told by your doctor.  Get up slowly from lying down or sitting.  Avoid hot showers and a lot of heat as told by your doctor.  Return to your normal activities as told by your doctor. Ask what activities are safe for you.  Do not use any products that contain nicotine or tobacco, such as cigarettes and e-cigarettes. If you need help quitting, ask your doctor.  Keep all follow-up visits as told by your doctor. This is important. Contact a doctor if:  You throw up (vomit).  You have watery poop (diarrhea).  You have a fever for more than 2-3 days.  You feel more thirsty than normal.  You feel weak and tired. Get help right away if:  You have chest pain.  You have a fast or irregular heartbeat.  You lose feeling (get numbness) in any part of your body.  You cannot move your arms or your legs.  You have trouble talking.  You get sweaty or feel light-headed.  You faint.  You have trouble breathing.  You have trouble staying awake.  You feel confused. This information is not intended to replace advice given to you by your health care provider. Make sure you discuss any questions you have with your health care provider. Document Released: 11/18/2009 Document Revised: 05/12/2016 Document Reviewed: 05/12/2016 Elsevier Interactive Patient  Education  2017 [employer].

## 2017-06-02 ENCOUNTER — Telehealth: Payer: Self-pay | Admitting: Family Medicine

## 2017-06-02 NOTE — Telephone Encounter (Signed)
Pt pharmacy is calling regarding a drug interaction and to find out which one he should be taking pharmacy states that they have faxed this request and that patient is waiting on the rx

## 2017-06-03 NOTE — Telephone Encounter (Signed)
[phone] [facility] pharmacy is calling to follow up on the script clarification.  She is concerned with doubling up on the prescriptions he is taking and is needing authorization.  He has been by three times to try and pick it up.  Please advise pharmacist.  [phone]

## 2017-06-04 ENCOUNTER — Telehealth: Payer: Self-pay | Admitting: *Deleted

## 2017-06-04 NOTE — Telephone Encounter (Signed)
Pt advised through the interpreter service that he has been prescribed Rapaflo 4 mg. This medication is in place of the Terazosin 2 mg. Per pt stated he is going to the pharmacy to pickup.

## 2017-06-04 NOTE — Telephone Encounter (Signed)
Dr. Truddie Coco, from [facility] [phone], stated that the pt received Terazosin 2 mg picked up this Rx on 05/28/17. He was also precsribed Rapaflo 4 mg for prostate by Dr. Clelia Croft. She stated that this is a duplicate medication and that they both are alpha 1 blocker. She needs verification as to which med pt should be taken. Pls advise.

## 2017-06-04 NOTE — Telephone Encounter (Signed)
I defer to Dr Alver Fisher prescription. Please let patient know. thanks

## 2017-06-07 ENCOUNTER — Telehealth: Payer: Self-pay | Admitting: *Deleted

## 2017-06-07 NOTE — Telephone Encounter (Signed)
Dr. Clelia Croft can you call something else in, the Rapalfo  was 200.00 for #30   .Marland KitchenMarland KitchenMarland Kitchen

## 2017-06-08 ENCOUNTER — Telehealth: Payer: Self-pay | Admitting: Family Medicine

## 2017-06-08 NOTE — Telephone Encounter (Signed)
Patients wife MRN is  Lilla Shook Preferred Name:  None F, 68 yrs, 02/16/1949 MRN:  [id number]

## 2017-06-08 NOTE — Telephone Encounter (Signed)
Patient's wife is asking for FMLA for a month to take care of her husband since he was having such a hard time with the new medication he is taking and the fall he had. I have completed what I could from the OV notes and highlighted what needs to be completed. I will place the forms in Dr Alver Fisher box on 06/08/17 please return them to the FMLA/Disability box at the 102 checkout desk within 5-7 business days. Thank you!

## 2017-06-11 MED ORDER — ALFUZOSIN HCL ER 10 MG PO TB24: 10.0000 mg | ORAL_TABLET | Freq: Every day | ORAL | 0 refills | Status: DC

## 2017-06-11 NOTE — Telephone Encounter (Signed)
Failed flomax, terazosin  caused SEVERE orthostatic hypotension. rapaflo not covered Will try alfuzosin

## 2017-06-11 NOTE — Telephone Encounter (Signed)
Pt advised. Scheduled with Leretha Pol 10/8

## 2017-06-11 NOTE — Telephone Encounter (Signed)
Pt advised.

## 2017-06-11 NOTE — Telephone Encounter (Signed)
FOR A MONTH??? I thought he was better the next day. . .   If he is still needing additional care from his wife from that hypotensive episode on 9/21 then he absolutely needs to be seen in the office again for eval ASAP.  Was initially seen by Dr. Leretha Pol so rec f/u w/ her.

## 2017-06-14 ENCOUNTER — Encounter: Payer: Self-pay | Admitting: Family Medicine

## 2017-06-14 ENCOUNTER — Ambulatory Visit (INDEPENDENT_AMBULATORY_CARE_PROVIDER_SITE_OTHER): Payer: Medicare HMO | Admitting: Family Medicine

## 2017-06-14 VITALS — BP 132/88 | HR 76 | Temp 98.7°F | Resp 18 | Ht 65.0 in | Wt 120.2 lb

## 2017-06-14 DIAGNOSIS — I952 Hypotension due to drugs: Secondary | ICD-10-CM

## 2017-06-14 DIAGNOSIS — N401 Enlarged prostate with lower urinary tract symptoms: Secondary | ICD-10-CM

## 2017-06-14 DIAGNOSIS — R3915 Urgency of urination: Secondary | ICD-10-CM | POA: Diagnosis not present

## 2017-06-14 NOTE — Progress Notes (Signed)
10/8/20189:56 AM  Erik Aguirre 26-Nov-1940, 76 y.o. male [id number]  Chief Complaint  Patient presents with  . Hypotension    follow up pt isnt really sure why he is here but reading Dr. Leandro Reasoner note i beleive this is why     HPI:   Patient is a 76 y.o. male with past medical history significant for BPH who presents today for followup.  He reports nocturia 3-4. Urgency with leakage, and at times feels unable to completely empty bladder.  Trial of tamsulosin was ineffective.  I had rx terazosin  which caused severe orthostatic hypotension resulting in falls.  He was rx rapaflo but that was not covered. Rx for afluzosin sent, he has not picked up.  He reports all symptoms of hypotension resolved immediately with dc of terazosin.   However, his wife has been really worried about him and as requested FMLA to make sure he does not fall anymore.    Depression screen [facility] 2/9 05/28/2017 09/15/2016 01/15/2016  Decreased Interest 0 0 0  Down, Depressed, Hopeless 0 0 0  PHQ - 2 Score 0 0 0    Allergies  Allergen Reactions  . Lisinopril Cough    Prior to Admission medications   Medication Sig Start Date End Date Taking? Authorizing Provider  alfuzosin (UROXATRAL) 10 MG 24 hr tablet Take 1 tablet (10 mg total) by mouth daily with breakfast. **Needs office visit while on med for any refills** 06/11/17  Yes Sherren Mocha, MD  atenolol-chlorthalidone (TENORETIC) 50-25 MG tablet Take 1 tablet by mouth daily. 09/15/16  Yes McVey, Madelaine Bhat, PA-C  metFORMIN (GLUCOPHAGE) 1000 MG tablet Take 1 tablet (1,000 mg total) by mouth 2 (two) times daily with a meal. 12/07/16  Yes Sagardia, Eilleen Kempf, MD  Multiple Vitamin (MULTIVITAMIN) capsule Take 1 capsule by mouth daily. Reported on 01/15/2016   Yes [provider]    Past Medical History:  Diagnosis Date  . Acute appendicitis s/p alp appy 11/27/2014 11/26/2014  . BPH (benign prostatic hyperplasia)   . Hyperlipidemia   . Hypertension    . 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 LAPAROSCOPIC APPENDECTOMY N/A 11/27/2014   Procedure: APPENDECTOMY LAPAROSCOPIC ;  Surgeon: Karie Soda, MD;  Location: [facility];  Service: General;  Laterality: N/A;    Social History  Substance Use Topics  . Smoking status: Never Smoker  . Smokeless tobacco: Never Used  . Alcohol use No    Family History  Problem Relation Age of Onset  . Throat cancer Brother   . Asthma Son     Review of Systems  Constitutional: Negative for chills and fever.  Respiratory: Negative for cough and shortness of breath.   Cardiovascular: Negative for chest pain, palpitations and leg swelling.  Gastrointestinal: Negative for abdominal pain, nausea and vomiting.  Neurological: Negative for dizziness.     OBJECTIVE:  Blood pressure 132/88, pulse 76, temperature 98.7 F (37.1 C), temperature source Oral, resp. rate 18, height  (1.651 m), weight 120 lb 3.2 oz (54.5 kg), SpO2 96 %.  Physical Exam  Constitutional: He is oriented to person, place, and time and well-developed, well-nourished, and in no distress.  HENT:  Head: Normocephalic and atraumatic.  Mouth/Throat: Oropharynx is clear and moist.  Eyes: Pupils are equal, round, and reactive to light. EOM are normal.  Neck: Neck supple.  Cardiovascular: Normal rate and regular rhythm.  Exam reveals no gallop and no friction rub.  No murmur heard. Pulmonary/Chest: Effort normal and breath sounds normal. He has no wheezes. He has no rales.  Neurological: He is alert and oriented to person, place, and time. Gait normal.  Skin: Skin is warm and dry.    ASSESSMENT and PLAN  1. Benign prostatic hyperplasia (BPH) with urinary urgency Symptomatic, discussed with patient trying to pickup alfluzosin. If too costly, consider trial of finesteride with monitoring of PSA.  2. Hypotension due to drugs Resolved with dc of terazosin. Will monitor once  started on alfuzosin, though risk is less.   Return in about 1 week (around 06/21/2017).    Myles Lipps, MD Primary Care at [facility] [address] [place], [place] [address] Ph.  [phone] Fax [phone]

## 2017-06-14 NOTE — Patient Instructions (Signed)
     IF you received an x-ray today, you will receive an invoice from [facility]. Please contact [facility] at [phone] with questions or concerns regarding your invoice.   IF you received labwork today, you will receive an invoice from [facility]. Please contact [facility] at [phone] with questions or concerns regarding your invoice.   Our billing staff will not be able to assist you with questions regarding bills from these companies.  You will be contacted with the lab results as soon as they are available. The fastest way to get your results is to activate your My Chart account. Instructions are located on the last page of this paperwork. If you have not heard from us regarding the results in 2 weeks, please contact this office.     

## 2017-06-15 NOTE — Telephone Encounter (Signed)
Paperwork scanned and faxed to employer on 06/15/17

## 2017-06-22 ENCOUNTER — Ambulatory Visit (INDEPENDENT_AMBULATORY_CARE_PROVIDER_SITE_OTHER): Payer: Medicare HMO | Admitting: Family Medicine

## 2017-06-22 ENCOUNTER — Encounter: Payer: Self-pay | Admitting: Family Medicine

## 2017-06-22 VITALS — BP 100/64 | HR 68 | Temp 97.7°F | Resp 16 | Ht 64.96 in | Wt 120.0 lb

## 2017-06-22 DIAGNOSIS — I10 Essential (primary) hypertension: Secondary | ICD-10-CM | POA: Diagnosis not present

## 2017-06-22 DIAGNOSIS — R3915 Urgency of urination: Secondary | ICD-10-CM | POA: Diagnosis not present

## 2017-06-22 DIAGNOSIS — N401 Enlarged prostate with lower urinary tract symptoms: Secondary | ICD-10-CM | POA: Diagnosis not present

## 2017-06-22 DIAGNOSIS — I952 Hypotension due to drugs: Secondary | ICD-10-CM

## 2017-06-22 NOTE — Progress Notes (Signed)
10/16/20184:01 PM  Erik Aguirre [birth year], 76 y.o. male [id number]  Chief Complaint  Patient presents with  . Medication F/U     alfuzosin    HPI:   Patient is a 76 y.o. male with who presents today for fu after starting afluzosin.   Has been taking for a week , tolerating well. Denies any othrostatic hypotension symptoms. He reports continued nocturia but urinating much more comfortable, improved stream and emptying. He has been checking his BP at home daily and has not been taking his atenolol-chlorthalidone.   Depression screen [facility] 2/9 06/22/2017 05/28/2017 09/15/2016  Decreased Interest 0 0 0  Down, Depressed, Hopeless 0 0 0  PHQ - 2 Score 0 0 0    Allergies  Allergen Reactions  . Lisinopril Cough  . Terazosin Hcl Other (See Comments)    Severe orthostatic hypotension resulting in falls    Prior to Admission medications   Medication Sig Start Date End Date Taking? Authorizing Provider  alfuzosin (UROXATRAL) 10 MG 24 hr tablet Take 1 tablet (10 mg total) by mouth daily with breakfast. **Needs office visit while on med for any refills** 06/11/17  Yes Sherren Mocha, MD  metFORMIN (GLUCOPHAGE) 1000 MG tablet Take 1 tablet (1,000 mg total) by mouth 2 (two) times daily with a meal. 12/07/16  Yes Sagardia, Eilleen Kempf, MD  Multiple Vitamin (MULTIVITAMIN) capsule Take 1 capsule by mouth daily. Reported on 01/15/2016   Yes [provider]    Past Medical History:  Diagnosis Date  . Acute appendicitis s/p alp appy 11/27/2014 11/26/2014  . BPH (benign prostatic hyperplasia)   . Hyperlipidemia   . Hypertension   . 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 LAPAROSCOPIC APPENDECTOMY N/A 11/27/2014   Procedure: APPENDECTOMY LAPAROSCOPIC ;  Surgeon: Karie Soda, MD;  Location: [facility];  Service: General;  Laterality: N/A;    Social History  Substance Use Topics  . Smoking status: Never Smoker  . Smokeless tobacco: Never Used  .  Alcohol use No    Family History  Problem Relation Age of Onset  . Throat cancer Brother   . Asthma Son     Review of Systems  Constitutional: Negative for malaise/fatigue.  Cardiovascular: Negative for chest pain and palpitations.  Musculoskeletal: Negative for falls.  Neurological: Negative for dizziness and weakness.     OBJECTIVE:  Blood pressure 100/64, pulse 68, temperature 97.7 F (36.5 C), temperature source Oral, resp. rate 16, height 5' 4.96" (1.65 m), weight 120 lb (54.4 kg), SpO2 98 %.  Orthostatic VS for the past 24 hrs (Last 3 readings):  BP- Lying Pulse- Lying BP- Sitting Pulse- Sitting BP- Standing at 0 minutes Pulse- Standing at 0 minutes  06/22/17 1520 135/73 67 118/68 59 120/68 65    Physical Exam  Constitutional: He is oriented to person, place, and time and well-developed, well-nourished, and in no distress.  HENT:  Head: Normocephalic and atraumatic.  Mouth/Throat: Oropharynx is clear and moist.  Eyes: Pupils are equal, round, and reactive to light. EOM are normal.  Neck: Neck supple.  Cardiovascular: Normal rate and regular rhythm.  Exam reveals no gallop and no friction rub.   No murmur heard. Pulmonary/Chest: Effort normal and breath sounds normal. He has no wheezes. He has no rales.  Neurological: He is alert and oriented to person, place, and time. Gait normal.  Skin: Skin is warm and dry.      ASSESSMENT and PLAN  1. Hypotension due to drugs - Orthostatic vital signs are normal, tolerating new BPH med well  2. Essential hypertension At goal off atenolol-chlorthalidone, cont checking BP daily.   3. Benign prostatic hyperplasia (BPH) with urinary urgency Symptoms improved on afluzosin, cont current regime   Return in about 2 weeks (around 07/06/2017).    Myles Lipps, MD Primary Care at [facility] [address] [place], [place] [address] Ph.  [phone] Fax [phone]

## 2017-06-22 NOTE — Patient Instructions (Signed)
     IF you received an x-ray today, you will receive an invoice from [facility]. Please contact [facility] at [phone] with questions or concerns regarding your invoice.   IF you received labwork today, you will receive an invoice from [facility]. Please contact [facility] at [phone] with questions or concerns regarding your invoice.   Our billing staff will not be able to assist you with questions regarding bills from these companies.  You will be contacted with the lab results as soon as they are available. The fastest way to get your results is to activate your My Chart account. Instructions are located on the last page of this paperwork. If you have not heard from us regarding the results in 2 weeks, please contact this office.     

## 2017-07-06 ENCOUNTER — Ambulatory Visit (INDEPENDENT_AMBULATORY_CARE_PROVIDER_SITE_OTHER): Payer: Medicare HMO | Admitting: Family Medicine

## 2017-07-06 ENCOUNTER — Encounter: Payer: Self-pay | Admitting: Family Medicine

## 2017-07-06 VITALS — BP 112/64 | HR 61 | Temp 98.3°F | Resp 16 | Ht 64.96 in | Wt 120.4 lb

## 2017-07-06 DIAGNOSIS — R3915 Urgency of urination: Secondary | ICD-10-CM

## 2017-07-06 DIAGNOSIS — N401 Enlarged prostate with lower urinary tract symptoms: Secondary | ICD-10-CM

## 2017-07-06 DIAGNOSIS — R972 Elevated prostate specific antigen [PSA]: Secondary | ICD-10-CM

## 2017-07-06 DIAGNOSIS — I10 Essential (primary) hypertension: Secondary | ICD-10-CM

## 2017-07-06 DIAGNOSIS — E119 Type 2 diabetes mellitus without complications: Secondary | ICD-10-CM

## 2017-07-06 NOTE — Progress Notes (Signed)
10/30/201810:14 AM  Sherald BargeKien Schepers Jan 01, 1941, 76 y.o. male [id number]  Chief Complaint  Patient presents with  . Follow-up    2 week f/u for hypotension    HPI:   Patient is a 76 y.o. male with past medical history significant for HTN, DM2, BPH who presents today for follow-up.  He had significant hypotension from tamsulosin. Tolerating current medication well.  BP remains well controlled off atenolol and chlorthalidone. Mild increase in creatinine during episode of hypotension PSA ~ 4 He reports occasionally when he checks in the evening, BP will be slightly higher, but never > 150/90. Checks cbgs in am, today 118, which is average for him. Denies hypoglycemia symptoms, events.   [facility] 2[facility] 07/06/2017 06/22/2017 05/28/2017  Decreased Interest 0 0 0  Down, Depressed, Hopeless 0 0 0  PHQ - 2 Score 0 0 0    Allergies  Allergen Reactions  . Lisinopril Cough  . Terazosin Hcl Other (See Comments)    Severe orthostatic hypotension resulting in falls    Prior to Admission medications   Medication Sig Start Date End Date Taking? Authorizing Provider  alfuzosin (UROXATRAL) 10 MG 24 hr tablet Take 1 tablet (10 mg total) by mouth daily with breakfast. **Needs office visit while on med for any refills** 06/11/17  Yes Sherren MochaShaw, Eva N, MD  metFORMIN (GLUCOPHAGE) 1000 MG tablet Take 1 tablet (1,000 mg total) by mouth 2 (two) times daily with a meal. 12/07/16  Yes Sagardia, Eilleen KempfMiguel Jose, MD  Multiple Vitamin (MULTIVITAMIN) capsule Take 1 capsule by mouth daily. Reported on 01/15/2016   Yes [provider]    Past Medical History:  Diagnosis Date  . Acute appendicitis s/p alp appy 11/27/2014 11/26/2014  . BPH (benign prostatic hyperplasia)   . Hyperlipidemia   . Hypertension   . 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 LAPAROSCOPIC APPENDECTOMY N/A 11/27/2014   Procedure: APPENDECTOMY LAPAROSCOPIC ;  Surgeon: Karie SodaSteven  Gross, MD;  Location: [facility];  Service: General;  Laterality: N/A;    Social History  Substance Use Topics  . Smoking status: Never Smoker  . Smokeless tobacco: Never Used  . Alcohol use No    Family History  Problem Relation Age of Onset  . Throat cancer Brother   . Asthma Son     Review of Systems  Constitutional: Negative for chills and fever.  Respiratory: Negative for cough and shortness of breath.   Cardiovascular: Negative for chest pain, palpitations and leg swelling.  Gastrointestinal: Negative for abdominal pain, nausea and vomiting.  Musculoskeletal: Negative for falls.  Neurological: Negative for dizziness.     OBJECTIVE:  Blood pressure 112/64, pulse 61, temperature 98.3 F (36.8 C), temperature source Oral, resp. rate 16, height 5' 4.96" (1.65 m), weight 120 lb 6.4 oz (54.6 kg), SpO2 99 %.  Physical Exam  Constitutional: He is oriented to person, place, and time and well-developed, well-nourished, and in no distress.  HENT:  Head: Normocephalic and atraumatic.  Mouth/Throat: Oropharynx is clear and moist.  Eyes: Pupils are equal, round, and reactive to light. EOM are normal.  Neck: Neck supple.  Cardiovascular: Normal rate and regular rhythm.  Exam reveals no gallop and no friction rub.   No murmur heard. Pulmonary/Chest: Effort normal and breath sounds normal. He has no wheezes. He has no rales.  Neurological: He is alert and oriented to person, place, and time. Gait normal.  Skin: Skin is warm and dry.  ASSESSMENT and PLAN  1. Essential hypertension At goal on afluzosin. Rechecking creatinine.  - Basic metabolic panel; Future  2. Benign prostatic hyperplasia (BPH) with urinary urgency Doing well on aflusozin. - PSA; Future  3. Diabetes mellitus with no complication (HCC) Fastin cbgs at goal, last a1c 6.8  4. Elevated PSA, less than 10 ng/ml - PSA; Future  Return in about 3 months (around 10/06/2017).    Myles Lipps, MD Primary  Care at [facility] [address] [place], [place] [address] Ph.  [phone] Fax [phone]

## 2017-07-06 NOTE — Patient Instructions (Signed)
     IF you received an x-ray today, you will receive an invoice from [facility]. Please contact [facility] at [phone] with questions or concerns regarding your invoice.   IF you received labwork today, you will receive an invoice from [facility]. Please contact [facility] at [phone] with questions or concerns regarding your invoice.   Our billing staff will not be able to assist you with questions regarding bills from these companies.  You will be contacted with the lab results as soon as they are available. The fastest way to get your results is to activate your My Chart account. Instructions are located on the last page of this paperwork. If you have not heard from us regarding the results in 2 weeks, please contact this office.     

## 2017-11-10 ENCOUNTER — Ambulatory Visit (INDEPENDENT_AMBULATORY_CARE_PROVIDER_SITE_OTHER): Payer: Medicare HMO | Admitting: Emergency Medicine

## 2017-11-10 ENCOUNTER — Ambulatory Visit (INDEPENDENT_AMBULATORY_CARE_PROVIDER_SITE_OTHER): Payer: Medicare HMO

## 2017-11-10 ENCOUNTER — Other Ambulatory Visit: Payer: Self-pay

## 2017-11-10 ENCOUNTER — Telehealth: Payer: Self-pay | Admitting: Emergency Medicine

## 2017-11-10 ENCOUNTER — Encounter: Payer: Self-pay | Admitting: Emergency Medicine

## 2017-11-10 VITALS — BP 192/94 | HR 67 | Temp 97.6°F | Resp 16 | Ht 65.16 in | Wt 123.1 lb

## 2017-11-10 DIAGNOSIS — I10 Essential (primary) hypertension: Secondary | ICD-10-CM | POA: Diagnosis not present

## 2017-11-10 DIAGNOSIS — R3915 Urgency of urination: Secondary | ICD-10-CM

## 2017-11-10 DIAGNOSIS — R0789 Other chest pain: Secondary | ICD-10-CM | POA: Insufficient documentation

## 2017-11-10 DIAGNOSIS — S299XXA Unspecified injury of thorax, initial encounter: Secondary | ICD-10-CM

## 2017-11-10 DIAGNOSIS — W19XXXA Unspecified fall, initial encounter: Secondary | ICD-10-CM | POA: Diagnosis not present

## 2017-11-10 DIAGNOSIS — S2242XA Multiple fractures of ribs, left side, initial encounter for closed fracture: Secondary | ICD-10-CM

## 2017-11-10 DIAGNOSIS — R0781 Pleurodynia: Secondary | ICD-10-CM | POA: Diagnosis not present

## 2017-11-10 DIAGNOSIS — N401 Enlarged prostate with lower urinary tract symptoms: Secondary | ICD-10-CM | POA: Diagnosis not present

## 2017-11-10 MED ORDER — TRAMADOL HCL 50 MG PO TABS: 50.0000 mg | ORAL_TABLET | Freq: Three times a day (TID) | ORAL | 0 refills | Status: DC | PRN

## 2017-11-10 MED ORDER — ALFUZOSIN HCL ER 10 MG PO TB24: 10.0000 mg | ORAL_TABLET | Freq: Every day | ORAL | 3 refills | Status: DC

## 2017-11-10 NOTE — Telephone Encounter (Signed)
Copied from CRM [id number]. Topic: Quick Communication - Rx Refill/Question >> Nov 10, 2017 11:07 AM Oneal GroutSebastian, Jennifer S wrote: Medication: metFORMIN (GLUCOPHAGE) 1000 MG tablet, 1 tab 1x daily   Has the patient contacted their pharmacy? Yes.     (Agent: If no, request that the patient contact the pharmacy for the refill.)   [facility] phone [facility]   Agent: Please be advised that RX refills may take up to 3 business days. We ask that you follow-up with your pharmacy.

## 2017-11-10 NOTE — Patient Instructions (Addendum)
IF you received an x-ray today, you will receive an invoice from [facility]. Please contact [facility] at [phone] with questions or concerns regarding your invoice.   IF you received labwork today, you will receive an invoice from [facility]. Please contact [facility] at [phone] with questions or concerns regarding your invoice.   Our billing staff will not be able to assist you with questions regarding bills from these companies.  You will be contacted with the lab results as soon as they are available. The fastest way to get your results is to activate your My Chart account. Instructions are located on the last page of this paperwork. If you have not heard from [place] regarding the results in 2 weeks, please contact this office.     Gy x??ng s??n (Rib Fracture) Gy x??ng s??n l gy ho?c n?t m?t trong s? cc x??ng s??n. X??ng s??n l m?t nhm x??ng di, cong, bao quanh l?ng ng?c v g?n vo c?t s?ng c?a b?n. Chng b?o v? ph?i v cc c? quan khc trong khoang ng?c c?a b?n. X??ng s??n b? gy ho?c n?t th??ng gy ?au ??n, nh?ng h?u nh? khng gy ra nh?ng v?n ?? khc. Theo th?i gian, h?u h?t cc ch? gy c?a x??ng s??n s? li?n. Tuy nhin, gy x??ng s??n c th? nghim tr?ng h?n n?u c nhi?u x??ng s??n b? gy ho?c n?u cc x??ng s??n b? gy tr?ch ra kh?i v? tr v ?m vo cc c?u trc khc. NGUYN NHN  M?t c ?nh tr?c di?n vo ng?c. V d?, ?i?u ny c th? x?y ra trong cc mn th? thao ??i khng, tai n?n  t, ho?c ng ??p vo m?t v?t c?ng.  Cc ??ng tc di chuy?n l?p ?i l?p l?i v?i c??ng ?? l?n, ch?ng h?n nh? nm bng chy ho?c c nh?ng c?n ho d? d?i. TRI?U CH?NG  ?au khi ht vo ho?c ho.  ?au khi ai ? ?n vo vng b? th??ng t?n. CH?N ?ON Chuyn gia ch?m [place] s?c kh?e s? khm th?c th?. C th? c?n ph?i th?c hi?n cc ki?m tra hnh ?nh khc nhau ?? kh?ng ??nh ch?n ?on v tm cc th??ng t?n lin quan. Cc ki?m tra ny c th? bao g?m ch?p X-quang ng?c, ch?p c?t l?p ?i?n  ton (CT), ch?p c?ng h??ng t? (MRI), ho?c ch?p qut x??ng. ?I?U TR? Cc ch? gy ? x??ng s??n th??ng s? t? li?n trong 1 - 3 thng. Th?i gian li?n x??ng lu h?n th??ng lin quan ??n ho lin t?c ho?c c nh?ng ho?t ??ng khc lm th??ng t?n tr?m tr?ng thm. Trong gian ?o?n li?n x??ng, gi?m ?au ?ng vai tr r?t quan tr?ng. Thu?c th??ng ???c dng ?? gi?m ?au. C th? c?n ph?i nh?p vi?n ho?c ph?u thu?t ??i v?i cc th??ng t?n n?ng, nh? trong tr??ng h?p nhi?u x??ng s??n b? gy ho?c x??ng s??n b? tr?ch kh?i v? tr. H??NG D?N CH?M [place] T?I NH  Trnh ho?t ??ng g?ng s?c v b?t k? ho?t ??ng ho?c v?n ??ng no gy ?au. C?n th?n trong cc ho?t ??ng v trnh va vo x??ng s??n b? th??ng t?n.  T?ng d?n ho?t ??ng theo ch? d?n c?a chuyn gia ch?m [place] s?c kh?e.  Ch? s? d?ng thu?c khng c?n k ??n ho?c thu?c c?n k ??n theo ch? d?n c?a chuyn gia ch?m [place] s?c kh?e. Khng dng cc lo?i thu?c khc m khng h?i  ki?n c?a chuyn gia ch?m [place] s?c kh?e tr??c.  Ch??m ? vo vng b? th??ng t?n  trong 1 - 2 ngy ??u sau khi b?n ? ???c ?i?u tr? ho?c theo ch? d?n c?a chuyn gia ch?m [place] s?c kh?e. Ch??m ? s? gip lm gi?m vim v gi?m ?au. ? Cho ? vo ti nh?a. ? ?? kh?n t?m [place] v ti. ? ?? ? ch??m trong 15 - 20 pht m?t l?n, 2 gi? m?t l?n khi th?c.  Ht th? su theo ch? d?n c?a chuyn gia ch?m [place] s?c kh?e. Vi?c ny s? gip ng?n ng?a b?nh vim ph?i, m?t bi?n ch?ng ph? bi?n c?a x??ng s??n gy. Chuyn gia ch?m [place] s?c kh?e c th? h??ng d?n b?n: ? Ht th? su vi l?n m?i ngy. ? C? g?ng ho vi l?n m?i ngy trong khi p m?t ci g?i vo vng b? th??ng t?n ? Dng m?t d?ng c? c tn l ph? dung k? khuy?n khch ?? t?p ht th? su vi l?n m?i ngy.  U?ng ?? n??c ?? gi? cho n??c ti?u trong ho?c vng nh?t. ?i?u ny s? gip b?n trnh b? to bn.  Khng ?eo ?ai ho?c b x??ng s??n. Nh?ng v?t ny lm h?n ch? h h?p, c th? d?n ??n vim ph?i. HY XIN ???C CH?M [place] Y T? NGAY L?P T?C N?U:  B?n b? s?t.  B?n b? kh th? ho?c th?  d?c.  B?n b? ho lin t?c, ho?c b?n ho ra ??m ??c ho?c c mu.  B?n c?m th?y kh ch?u trong d? dy (bu?n nn), nn ra (nn), ho?c b? ?au b?ng.  B?n b? ?au tr?m tr?ng thm, khng ki?m sot ???c b?ng thu?c. ??M B?O B?N:  Hi?u cc h??ng d?n ny.  S? theo di tnh tr?ng c?a mnh.  S? yu c?u tr? gip ngay l?p t?c n?u b?n c?m th?y khng kh?e ho?c th?y tr?m tr?ng h?n. Thng tin ny khng nh?m m?c ?ch thay th? cho l?i khuyn m chuyn gia ch?m [place] s?c kh?e ni v?i qu v?. Hy b?o ??m qu v? ph?i th?o lu?n b?t k? v?n ?? g m qu v? c v?i chuyn gia ch?m [place] s?c kh?e c?a qu v?. Document Released: 08/24/2005 Document Revised: 04/26/2013 Document Reviewed: 10/26/2012 Elsevier Interactive Patient Education  2018 [place].  Rib Fracture A rib fracture is a break or crack in one of the bones of the ribs. The ribs are like a cage that goes around your upper chest. A broken or cracked rib is often painful, but most do not cause other problems. Most rib fractures heal on their own in 1-3 months. Follow these instructions at home:  Avoid activities that cause pain to the injured area. Protect your injured area.  Slowly increase activity as told by your doctor.  Take medicine as told by your doctor.  Put ice on the injured area for the first 1-2 days after you have been treated or as told by your doctor. ? Put ice in a plastic bag. ? Place a towel between your skin and the bag. ? Leave the ice on for 15-20 minutes at a time, every 2 hours while you are awake.  Do deep breathing as told by your doctor. You may be told to: ? Take deep breaths many times a day. ? Cough many times a day while hugging a pillow. ? Use a device (incentive spirometer) to perform deep breathing many times a day.  Drink enough fluids to keep your pee (urine) clear or pale yellow.  Do not wear a rib belt or binder. These do not allow you to  breathe deeply. Get help right away if:  You have a fever.  You have  trouble breathing.  You cannot stop coughing.  You cough up thick or bloody spit (mucus).  You feel sick to your stomach (nauseous), throw up (vomit), or have belly (abdominal) pain.  Your pain gets worse and medicine does not help. This information is not intended to replace advice given to you by your health care provider. Make sure you discuss any questions you have with your health care provider. Document Released: 06/02/2008 Document Revised: 01/30/2016 Document Reviewed: 10/26/2012 Elsevier Interactive Patient Education  [employer].

## 2017-11-10 NOTE — Telephone Encounter (Signed)
Pt requesting metformin. The original prescription is for 1000 mg tab twice a day but the patient is requesting it to read 1(1000 mg tab) once a day. Please review.  LOV 11/10/17  NOV  11/17/17  Last refill was 12/07/16.  Provider: Edwina BarthMiguel Sagardia, MD  [facility] [place] [facility]

## 2017-11-10 NOTE — Progress Notes (Signed)
Erik Aguirre 77 y.o.   Chief Complaint  Patient presents with  . Fall    X 3 days- left side pain  . Medication Refill    Alfuzosin HCI    HISTORY OF PRESENT ILLNESS: This is a 77 y.o. male complaining of left rib cage.  Accidentally fell 3 days ago.  Denies loss of consciousness.  States it was a trip and fall. Also has a history of hypertension and BPH.  Both have been controlled with alfuzosin but has been off the medication for the last 2 months at least.  Ran out of refills.  States his blood pressure has been on the high side at home.  HPI   Prior to Admission medications   Medication Sig Start Date End Date Taking? Authorizing Provider  alfuzosin (UROXATRAL) 10 MG 24 hr tablet Take 1 tablet (10 mg total) by mouth daily with breakfast. **Needs office visit while on med for any refills** 06/11/17  Yes Shawnee Knapp, MD  metFORMIN (GLUCOPHAGE) 1000 MG tablet Take 1 tablet (1,000 mg total) by mouth 2 (two) times daily with a meal. 12/07/16  Yes Mylynn Dinh, Ines Bloomer, MD  Multiple Vitamin (MULTIVITAMIN) capsule Take 1 capsule by mouth daily. Reported on 01/15/2016   Yes [provider]    Allergies  Allergen Reactions  . Lisinopril Cough  . 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 Dyslipidemia 11/01/2014  . HTN (hypertension) 07/02/2014  . Diabetes mellitus with no complication ([place]) 51/76/1607  . 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 Benign prostatic hyperplasia (BPH) with urinary urgency 03/17/2013    Past Medical History:  Diagnosis Date  . Acute appendicitis s/p alp appy 11/27/2014 11/26/2014  . BPH (benign prostatic hyperplasia)   . Hyperlipidemia   . Hypertension   . 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 LAPAROSCOPIC APPENDECTOMY N/A 11/27/2014   Procedure: APPENDECTOMY LAPAROSCOPIC ;  Surgeon: Michael Boston, MD;  Location: [facility];  Service: General;  Laterality: N/A;    Social History   Socioeconomic History  . Marital status: Married    Spouse name: Not on file  . Number of children: 5  . Years of education: Not on file  . Highest education level: Not on file  Social Needs  . Financial resource strain: Not on file  . Food insecurity - worry: Not on file  . Food insecurity - inability: Not on file  . Transportation needs - medical: Not on file  . Transportation needs - non-medical: Not on file  Occupational History  . Occupation: Retired  Tobacco Use  . Smoking status: Never Smoker  . Smokeless tobacco: Never Used  Substance and Sexual Activity  . Alcohol use: Yes    Comment: occ  . Drug use: No  . Sexual activity: Not on file  Other Topics Concern  . Not on file  Social History Narrative   Originally from [employer] [place].   Language of English okay    Family History  Problem Relation Age of Onset  . Throat cancer Brother   . Asthma Son      Review of Systems  Constitutional: Negative.  Negative for chills and fever.  HENT: Negative.  Negative for sore throat.   Eyes: Negative.  Negative for blurred vision and double vision.  Respiratory: Negative for cough,  hemoptysis and shortness of breath.   Cardiovascular: Positive for chest pain (Rib pain). Negative for palpitations and leg swelling.  Gastrointestinal: Negative.  Negative for abdominal pain, diarrhea, nausea and vomiting.  Genitourinary: Positive for frequency.       Nocturia  Musculoskeletal: Negative for back pain, myalgias and neck pain.  Skin: Negative.  Negative for rash.  Neurological: Negative for dizziness and headaches.  Endo/Heme/Allergies: Negative.   All other systems reviewed and are negative.   Vitals:   11/10/17 0919  BP: (!) 184/100  Pulse: 67  Resp: 16  Temp: 97.6 F (36.4 C)  SpO2: 98%    Physical Exam  Constitutional: He is oriented to person, place, and  time. He appears well-developed and well-nourished.  HENT:  Head: Normocephalic and atraumatic.  Nose: Nose normal.  Mouth/Throat: Oropharynx is clear and moist.  Eyes: Conjunctivae and EOM are normal. Pupils are equal, round, and reactive to light.  Neck: Normal range of motion. Neck supple.  Cardiovascular: Normal rate, regular rhythm and normal heart sounds.  Pulmonary/Chest: Effort normal and breath sounds normal. He exhibits tenderness (Left lateral side).  Abdominal: Soft. He exhibits no distension. There is no tenderness.  Musculoskeletal: Normal range of motion.  Neurological: He is alert and oriented to person, place, and time. No sensory deficit. He exhibits normal muscle tone.  Skin: Skin is warm and dry. Capillary refill takes less than 2 seconds. No rash noted.  Psychiatric: He has a normal mood and affect. His behavior is normal.  Vitals reviewed.  Dg Ribs Unilateral W/chest Left  Result Date: 11/10/2017 CLINICAL DATA:  Left chest injury due to a fall today. Initial encounter. EXAM: LEFT RIBS AND CHEST - 3+ VIEW COMPARISON:  CT chest 02/01/2015. FINDINGS: The patient has a small left pleural effusion. Lungs are clear. No pneumothorax. Heart size is mildly enlarged. Aortic atherosclerosis is noted. Minimally displaced fractures of the left third, fourth, fifth and sixth ribs are identified. No other acute bony abnormality is seen. IMPRESSION: Acute left third through sixth rib fractures. Negative for pneumothorax. Small left pleural effusion. Electronically Signed   By: Inge Rise M.D.   On: 11/10/2017 10:06     ASSESSMENT & PLAN: [name] was seen today for fall and medication refill.  Diagnoses and all orders for this visit:  Closed fracture of multiple ribs of left side, initial encounter -     traMADol (ULTRAM) 50 MG tablet; Take 1 tablet (50 mg total) by mouth every 8 (eight) hours as needed.  Injury of chest wall, initial encounter -     DG Ribs Unilateral W/Chest  Left; Future  Rib pain -     DG Ribs Unilateral W/Chest Left; Future -     traMADol (ULTRAM) 50 MG tablet; Take 1 tablet (50 mg total) by mouth every 8 (eight) hours as needed.  Benign prostatic hyperplasia (BPH) with urinary urgency -     alfuzosin (UROXATRAL) 10 MG 24 hr tablet; Take 1 tablet (10 mg total) by mouth daily with breakfast. **Needs office visit while on med for any refills**  Essential hypertension -     alfuzosin (UROXATRAL) 10 MG 24 hr tablet; Take 1 tablet (10 mg total) by mouth daily with breakfast. **Needs office visit while on med for any refills**  Accidental fall, initial encounter   A total of 30 minutes was spent in the room with the patient, greater than 50% of which was in counseling/coordination of care.  Patient Instructions  IF you received an x-ray today, you will receive an invoice from [facility]. Please contact [facility] at [phone] with questions or concerns regarding your invoice.   IF you received labwork today, you will receive an invoice from [facility]. Please contact [facility] at [phone] with questions or concerns regarding your invoice.   Our billing staff will not be able to assist you with questions regarding bills from these companies.  You will be contacted with the lab results as soon as they are available. The fastest way to get your results is to activate your My Chart account. Instructions are located on the last page of this paperwork. If you have not heard from [place] regarding the results in 2 weeks, please contact this office.     Gy x??ng s??n (Rib Fracture) Gy x??ng s??n l gy ho?c n?t m?t trong s? cc x??ng s??n. X??ng s??n l m?t nhm x??ng di, cong, bao quanh l?ng ng?c v g?n vo c?t s?ng c?a b?n. Chng b?o v? ph?i v cc c? quan khc trong khoang ng?c c?a b?n. X??ng s??n b? gy ho?c n?t th??ng gy ?au ??n, nh?ng h?u nh? khng gy ra nh?ng v?n ?? khc. Theo th?i gian, h?u h?t cc ch? gy c?a  x??ng s??n s? li?n. Tuy nhin, gy x??ng s??n c th? nghim tr?ng h?n n?u c nhi?u x??ng s??n b? gy ho?c n?u cc x??ng s??n b? gy tr?ch ra kh?i v? tr v ?m vo cc c?u trc khc. NGUYN NHN  M?t c ?nh tr?c di?n vo ng?c. V d?, ?i?u ny c th? x?y ra trong cc mn th? thao ??i khng, tai n?n  t, ho?c ng ??p vo m?t v?t c?ng.  Cc ??ng tc di chuy?n l?p ?i l?p l?i v?i c??ng ?? l?n, ch?ng h?n nh? nm bng chy ho?c c nh?ng c?n ho d? d?i. TRI?U CH?NG  ?au khi ht vo ho?c ho.  ?au khi ai ? ?n vo vng b? th??ng t?n. CH?N ?ON Chuyn gia ch?m [place] s?c kh?e s? khm th?c th?. C th? c?n ph?i th?c hi?n cc ki?m tra hnh ?nh khc nhau ?? kh?ng ??nh ch?n ?on v tm cc th??ng t?n lin quan. Cc ki?m tra ny c th? bao g?m ch?p X-quang ng?c, ch?p c?t l?p ?i?n ton (CT), ch?p c?ng h??ng t? (MRI), ho?c ch?p qut x??ng. ?I?U TR? Cc ch? gy ? x??ng s??n th??ng s? t? li?n trong 1 - 3 thng. Th?i gian li?n x??ng lu h?n th??ng lin quan ??n ho lin t?c ho?c c nh?ng ho?t ??ng khc lm th??ng t?n tr?m tr?ng thm. Trong gian ?o?n li?n x??ng, gi?m ?au ?ng vai tr r?t quan tr?ng. Thu?c th??ng ???c dng ?? gi?m ?au. C th? c?n ph?i nh?p vi?n ho?c ph?u thu?t ??i v?i cc th??ng t?n n?ng, nh? trong tr??ng h?p nhi?u x??ng s??n b? gy ho?c x??ng s??n b? tr?ch kh?i v? tr. H??NG D?N CH?M [place] T?I NH  Trnh ho?t ??ng g?ng s?c v b?t k? ho?t ??ng ho?c v?n ??ng no gy ?au. C?n th?n trong cc ho?t ??ng v trnh va vo x??ng s??n b? th??ng t?n.  T?ng d?n ho?t ??ng theo ch? d?n c?a chuyn gia ch?m [place] s?c kh?e.  Ch? s? d?ng thu?c khng c?n k ??n ho?c thu?c c?n k ??n theo ch? d?n c?a chuyn gia ch?m [place] s?c kh?e. Khng dng cc lo?i thu?c khc m khng h?i  ki?n c?a chuyn gia ch?m [place] s?c kh?e tr??c.  Ch??m ? vo vng b? th??ng t?n trong 1 -  2 ngy ??u sau khi b?n ? ???c ?i?u tr? ho?c theo ch? d?n c?a chuyn gia ch?m [place] s?c kh?e. Ch??m ? s? gip lm gi?m vim v gi?m ?au. ? Cho ? vo ti nh?a. ? ??  kh?n t?m [place] v ti. ? ?? ? ch??m trong 15 - 20 pht m?t l?n, 2 gi? m?t l?n khi th?c.  Ht th? su theo ch? d?n c?a chuyn gia ch?m [place] s?c kh?e. Vi?c ny s? gip ng?n ng?a b?nh vim ph?i, m?t bi?n ch?ng ph? bi?n c?a x??ng s??n gy. Chuyn gia ch?m [place] s?c kh?e c th? h??ng d?n b?n: ? Ht th? su vi l?n m?i ngy. ? C? g?ng ho vi l?n m?i ngy trong khi p m?t ci g?i vo vng b? th??ng t?n ? Dng m?t d?ng c? c tn l ph? dung k? khuy?n khch ?? t?p ht th? su vi l?n m?i ngy.  U?ng ?? n??c ?? gi? cho n??c ti?u trong ho?c vng nh?t. ?i?u ny s? gip b?n trnh b? to bn.  Khng ?eo ?ai ho?c b x??ng s??n. Nh?ng v?t ny lm h?n ch? h h?p, c th? d?n ??n vim ph?i. HY XIN ???C CH?M [place] Y T? NGAY L?P T?C N?U:  B?n b? s?t.  B?n b? kh th? ho?c th? d?c.  B?n b? ho lin t?c, ho?c b?n ho ra ??m ??c ho?c c mu.  B?n c?m th?y kh ch?u trong d? dy (bu?n nn), nn ra (nn), ho?c b? ?au b?ng.  B?n b? ?au tr?m tr?ng thm, khng ki?m sot ???c b?ng thu?c. ??M B?O B?N:  Hi?u cc h??ng d?n ny.  S? theo di tnh tr?ng c?a mnh.  S? yu c?u tr? gip ngay l?p t?c n?u b?n c?m th?y khng kh?e ho?c th?y tr?m tr?ng h?n. Thng tin ny khng nh?m m?c ?ch thay th? cho l?i khuyn m chuyn gia ch?m [place] s?c kh?e ni v?i qu v?. Hy b?o ??m qu v? ph?i th?o lu?n b?t k? v?n ?? g m qu v? c v?i chuyn gia ch?m [place] s?c kh?e c?a qu v?. Document Released: 08/24/2005 Document Revised: 04/26/2013 Document Reviewed: 10/26/2012 Elsevier Interactive Patient Education  2018 [employer].  Rib Fracture A rib fracture is a break or crack in one of the bones of the ribs. The ribs are like a cage that goes around your upper chest. A broken or cracked rib is often painful, but most do not cause other problems. Most rib fractures heal on their own in 1-3 months. Follow these instructions at home:  Avoid activities that cause pain to the injured area. Protect your injured area.  Slowly increase  activity as told by your doctor.  Take medicine as told by your doctor.  Put ice on the injured area for the first 1-2 days after you have been treated or as told by your doctor. ? Put ice in a plastic bag. ? Place a towel between your skin and the bag. ? Leave the ice on for 15-20 minutes at a time, every 2 hours while you are awake.  Do deep breathing as told by your doctor. You may be told to: ? Take deep breaths many times a day. ? Cough many times a day while hugging a pillow. ? Use a device (incentive spirometer) to perform deep breathing many times a day.  Drink enough fluids to keep your pee (urine) clear or pale yellow.  Do not wear a rib belt or binder. These do not allow you to breathe deeply.  Get help right away if:  You have a fever.  You have trouble breathing.  You cannot stop coughing.  You cough up thick or bloody spit (mucus).  You feel sick to your stomach (nauseous), throw up (vomit), or have belly (abdominal) pain.  Your pain gets worse and medicine does not help. This information is not intended to replace advice given to you by your health care provider. Make sure you discuss any questions you have with your health care provider. Document Released: 06/02/2008 Document Revised: 01/30/2016 Document Reviewed: 10/26/2012 Elsevier Interactive Patient Education  2018 Elsevier Inc.      Agustina Caroli, MD [facility]

## 2017-11-12 NOTE — Telephone Encounter (Signed)
Please advised pt wanting directions changed. Pt has upcoming appt on 11/17/17. Dgaddy, CMA

## 2017-11-13 NOTE — Telephone Encounter (Signed)
We will take care of this on the next OV 11/17/17.

## 2017-11-17 ENCOUNTER — Encounter: Payer: Self-pay | Admitting: Emergency Medicine

## 2017-11-17 ENCOUNTER — Ambulatory Visit (INDEPENDENT_AMBULATORY_CARE_PROVIDER_SITE_OTHER): Payer: Medicare HMO | Admitting: Emergency Medicine

## 2017-11-17 VITALS — BP 165/89 | HR 82 | Temp 98.4°F | Resp 17 | Ht 65.0 in | Wt 125.0 lb

## 2017-11-17 DIAGNOSIS — R0781 Pleurodynia: Secondary | ICD-10-CM

## 2017-11-17 DIAGNOSIS — S2242XD Multiple fractures of ribs, left side, subsequent encounter for fracture with routine healing: Secondary | ICD-10-CM

## 2017-11-17 MED ORDER — TRAMADOL HCL 50 MG PO TABS: 50.0000 mg | ORAL_TABLET | Freq: Three times a day (TID) | ORAL | 0 refills | Status: DC | PRN

## 2017-11-17 NOTE — Patient Instructions (Addendum)
   IF you received an x-ray today, you will receive an invoice from [facility]. Please contact [facility] at [phone] with questions or concerns regarding your invoice.   IF you received labwork today, you will receive an invoice from [facility]. Please contact [facility] at [phone] with questions or concerns regarding your invoice.   Our billing staff will not be able to assist you with questions regarding bills from these companies.  You will be contacted with the lab results as soon as they are available. The fastest way to get your results is to activate your My Chart account. Instructions are located on the last page of this paperwork. If you have not heard from us regarding the results in 2 weeks, please contact this office.     Gy x??ng s??n (Rib Fracture) Gy x??ng s??n l gy ho?c n?t m?t trong s? cc x??ng s??n. X??ng s??n l m?t nhm x??ng di, cong, bao quanh l?ng ng?c v g?n vo c?t s?ng c?a b?n. Chng b?o v? ph?i v cc c? quan khc trong khoang ng?c c?a b?n. X??ng s??n b? gy ho?c n?t th??ng gy ?au ??n, nh?ng h?u nh? khng gy ra nh?ng v?n ?? khc. Theo th?i gian, h?u h?t cc ch? gy c?a x??ng s??n s? li?n. Tuy nhin, gy x??ng s??n c th? nghim tr?ng h?n n?u c nhi?u x??ng s??n b? gy ho?c n?u cc x??ng s??n b? gy tr?ch ra kh?i v? tr v ?m vo cc c?u trc khc. NGUYN NHN  M?t c ?nh tr?c di?n vo ng?c. V d?, ?i?u ny c th? x?y ra trong cc mn th? thao ??i khng, tai n?n  t, ho?c ng ??p vo m?t v?t c?ng.  Cc ??ng tc di chuy?n l?p ?i l?p l?i v?i c??ng ?? l?n, ch?ng h?n nh? nm bng chy ho?c c nh?ng c?n ho d? d?i. TRI?U CH?NG  ?au khi ht vo ho?c ho.  ?au khi ai ? ?n vo vng b? th??ng t?n. CH?N ?ON Chuyn gia ch?m sc s?c kh?e s? khm th?c th?. C th? c?n ph?i th?c hi?n cc ki?m tra hnh ?nh khc nhau ?? kh?ng ??nh ch?n ?on v tm cc th??ng t?n lin quan. Cc ki?m tra ny c th? bao g?m ch?p X-quang ng?c, ch?p c?t l?p ?i?n  ton (CT), ch?p c?ng h??ng t? (MRI), ho?c ch?p qut x??ng. ?I?U TR? Cc ch? gy ? x??ng s??n th??ng s? t? li?n trong 1 - 3 thng. Th?i gian li?n x??ng lu h?n th??ng lin quan ??n ho lin t?c ho?c c nh?ng ho?t ??ng khc lm th??ng t?n tr?m tr?ng thm. Trong gian ?o?n li?n x??ng, gi?m ?au ?ng vai tr r?t quan tr?ng. Thu?c th??ng ???c dng ?? gi?m ?au. C th? c?n ph?i nh?p vi?n ho?c ph?u thu?t ??i v?i cc th??ng t?n n?ng, nh? trong tr??ng h?p nhi?u x??ng s??n b? gy ho?c x??ng s??n b? tr?ch kh?i v? tr. H??NG D?N CH?M SC T?I NH  Trnh ho?t ??ng g?ng s?c v b?t k? ho?t ??ng ho?c v?n ??ng no gy ?au. C?n th?n trong cc ho?t ??ng v trnh va vo x??ng s??n b? th??ng t?n.  T?ng d?n ho?t ??ng theo ch? d?n c?a chuyn gia ch?m sc s?c kh?e.  Ch? s? d?ng thu?c khng c?n k ??n ho?c thu?c c?n k ??n theo ch? d?n c?a chuyn gia ch?m sc s?c kh?e. Khng dng cc lo?i thu?c khc m khng h?i  ki?n c?a chuyn gia ch?m sc s?c kh?e tr??c.  Ch??m ? vo vng b? th??ng t?n   trong 1 - 2 ngy ??u sau khi b?n ? ???c ?i?u tr? ho?c theo ch? d?n c?a chuyn gia ch?m sc s?c kh?e. Ch??m ? s? gip lm gi?m vim v gi?m ?au. ? Cho ? vo ti nh?a. ? ?? kh?n t?m vo gi?a da v ti. ? ?? ? ch??m trong 15 - 20 pht m?t l?n, 2 gi? m?t l?n khi th?c.  Ht th? su theo ch? d?n c?a chuyn gia ch?m sc s?c kh?e. Vi?c ny s? gip ng?n ng?a b?nh vim ph?i, m?t bi?n ch?ng ph? bi?n c?a x??ng s??n gy. Chuyn gia ch?m sc s?c kh?e c th? h??ng d?n b?n: ? Ht th? su vi l?n m?i ngy. ? C? g?ng ho vi l?n m?i ngy trong khi p m?t ci g?i vo vng b? th??ng t?n ? Dng m?t d?ng c? c tn l ph? dung k? khuy?n khch ?? t?p ht th? su vi l?n m?i ngy.  U?ng ?? n??c ?? gi? cho n??c ti?u trong ho?c vng nh?t. ?i?u ny s? gip b?n trnh b? to bn.  Khng ?eo ?ai ho?c b x??ng s??n. Nh?ng v?t ny lm h?n ch? h h?p, c th? d?n ??n vim ph?i. HY XIN ???C CH?M [place] Y T? NGAY L?P T?C N?U:  B?n b? s?t.  B?n b? kh th? ho?c th?  d?c.  B?n b? ho lin t?c, ho?c b?n ho ra ??m ??c ho?c c mu.  B?n c?m th?y kh ch?u trong d? dy (bu?n nn), nn ra (nn), ho?c b? ?au b?ng.  B?n b? ?au tr?m tr?ng thm, khng ki?m sot ???c b?ng thu?c. ??M B?O B?N:  Hi?u cc h??ng d?n ny.  S? theo di tnh tr?ng c?a mnh.  S? yu c?u tr? gip ngay l?p t?c n?u b?n c?m th?y khng kh?e ho?c th?y tr?m tr?ng h?n. Thng tin ny khng nh?m m?c ?ch thay th? cho l?i khuyn m chuyn gia ch?m sc s?c kh?e ni v?i qu v?. Hy b?o ??m qu v? ph?i th?o lu?n b?t k? v?n ?? g m qu v? c v?i chuyn gia ch?m sc s?c kh?e c?a qu v?. Document Released: 08/24/2005 Document Revised: 04/26/2013 Document Reviewed: 10/26/2012 Elsevier Interactive Patient Education  2018 Elsevier Inc.  

## 2017-11-17 NOTE — Progress Notes (Signed)
Erik Aguirre 77 y.o.   Chief Complaint  Patient presents with  . Follow-up    rib fracture     HISTORY OF PRESENT ILLNESS: This is a 77 y.o. male seen by me last week with multiple fractures to his left rib cage sustained during a fall.  Doing better.  Ran out of tramadol.  No new symptoms.  HPI   Prior to Admission medications   Medication Sig Start Date End Date Taking? Authorizing Provider  alfuzosin (UROXATRAL) 10 MG 24 hr tablet Take 1 tablet (10 mg total) by mouth daily with breakfast. **Needs office visit while on med for any refills** 11/10/17 02/08/18 Yes Erik Aguirre, Erik Bloomer, MD  metFORMIN (GLUCOPHAGE) 1000 MG tablet Take 1 tablet (1,000 mg total) by mouth 2 (two) times daily with a meal. 12/07/16  Yes Erik Aguirre, Erik Bloomer, MD  Multiple Vitamin (MULTIVITAMIN) capsule Take 1 capsule by mouth daily. Reported on 01/15/2016   Yes [provider]  traMADol (ULTRAM) 50 MG tablet Take 1 tablet (50 mg total) by mouth every 8 (eight) hours as needed. 11/10/17  Yes Erik Pollen, MD    Allergies  Allergen Reactions  . Lisinopril Cough  . 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 Accidental fall 11/10/2017  . Rib pain 11/10/2017  . Injury of chest wall 11/10/2017  . Multiple closed fractures of ribs of left side 11/10/2017  . Dyslipidemia 11/01/2014  . HTN (hypertension) 07/02/2014  . Diabetes mellitus with no complication ([place]) 76/54/6503  . 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 Benign prostatic hyperplasia (BPH) with urinary urgency 03/17/2013    Past Medical History:  Diagnosis Date  . Acute appendicitis s/p alp appy 11/27/2014 11/26/2014  . BPH (benign prostatic hyperplasia)   . Hyperlipidemia   . Hypertension   . 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 LAPAROSCOPIC APPENDECTOMY N/A 11/27/2014   Procedure: APPENDECTOMY LAPAROSCOPIC ;  Surgeon: Erik Boston, MD;  Location: [facility];  Service: General;  Laterality: N/A;    Social History   Socioeconomic History  . Marital status: Married    Spouse name: Not on file  . Number of children: 5  . Years of education: Not on file  . Highest education level: Not on file  Social Needs  . Financial resource strain: Not on file  . Food insecurity - worry: Not on file  . Food insecurity - inability: Not on file  . Transportation needs - medical: Not on file  . Transportation needs - non-medical: Not on file  Occupational History  . Occupation: Retired  Tobacco Use  . Smoking status: Never Smoker  . Smokeless tobacco: Never Used  Substance and Sexual Activity  . Alcohol use: Yes    Comment: occ  . Drug use: No  . Sexual activity: Not on file  Other Topics Concern  . Not on file  Social History Narrative   Originally from [employer] [facility] [place].   Language of English okay    Family History  Problem Relation Age of Onset  . Throat cancer Brother   . Asthma Son      Review of Systems  Constitutional: Negative.  Negative for chills and fever.  Respiratory: Negative.  Negative for cough and shortness of breath.   Cardiovascular: Negative for palpitations.  Gastrointestinal: Negative.  Negative for abdominal  pain, nausea and vomiting.  Genitourinary: Negative.  Negative for hematuria.  Musculoskeletal:       Left rib cage pain.  Skin: Negative.  Negative for rash.  Neurological: Negative for dizziness and headaches.  All other systems reviewed and are negative.   Vitals:   11/17/17 1358  BP: (!) 165/89  Pulse: 82  Resp: 17  Temp: 98.4 F (36.9 C)  SpO2: 98%    Physical Exam  Constitutional: He is oriented to person, place, and time. He appears well-developed and well-nourished.  HENT:  Head: Normocephalic and atraumatic.  Eyes: Conjunctivae and EOM are normal. Pupils are equal, round, and reactive to  light.  Neck: Normal range of motion. Neck supple.  Cardiovascular: Normal rate, regular rhythm and normal heart sounds.  Pulmonary/Chest: Effort normal and breath sounds normal. He exhibits tenderness (left rib cage).  Abdominal: Soft. There is no tenderness.  Musculoskeletal: Normal range of motion.  Neurological: He is alert and oriented to person, place, and time. No sensory deficit. He exhibits normal muscle tone.  Skin: Skin is warm and dry. Capillary refill takes less than 2 seconds.  Psychiatric: He has a normal mood and affect. His behavior is normal.  Vitals reviewed.    ASSESSMENT & PLAN: [name] was seen today for follow-up.  Diagnoses and all orders for this visit:  Closed fracture of multiple ribs of left side with routine healing, subsequent encounter -     traMADol (ULTRAM) 50 MG tablet; Take 1 tablet (50 mg total) by mouth every 8 (eight) hours as needed.  Rib pain on left side -     traMADol (ULTRAM) 50 MG tablet; Take 1 tablet (50 mg total) by mouth every 8 (eight) hours as needed.    Patient Instructions       IF you received an x-ray today, you will receive an invoice from [facility] Rad[facility]. Please contact [facility] Radiology at [phone] with questions or concerns regarding your invoice.   IF you received labwork today, you will receive an invoice from [place]. Please contact [facility] at [phone] with questions or concerns regarding your invoice.   Our billing staff will not be able to assist you with questions regarding bills from these companies.  You will be contacted with the lab results as soon as they are available. The fastest way to get your results is to activate your My Chart account. Instructions are located on the last page of this paperwork. If you have not heard from [place] regarding the results in 2 weeks, please contact this office.      Gy x??ng s??n (Rib Fracture) Gy x??ng s??n l gy ho?c n?t m?t trong s? cc x??ng s??n.  X??ng s??n l m?t nhm x??ng di, cong, bao quanh l?ng ng?c v g?n vo c?t s?ng c?a b?n. Chng b?o v? ph?i v cc c? quan khc trong khoang ng?c c?a b?n. X??ng s??n b? gy ho?c n?t th??ng gy ?au ??n, nh?ng h?u nh? khng gy ra nh?ng v?n ?? khc. Theo th?i gian, h?u h?t cc ch? gy c?a x??ng s??n s? li?n. Tuy nhin, gy x??ng s??n c th? nghim tr?ng h?n n?u c nhi?u x??ng s??n b? gy ho?c n?u cc x??ng s??n b? gy tr?ch ra kh?i v? tr v ?m vo cc c?u trc khc. NGUYN NHN  M?t c ?nh tr?c di?n vo ng?c. V d?, ?i?u ny c th? x?y ra trong cc mn th? thao ??i khng, tai n?n  t, ho?c ng ??p vo m?t v?t c?ng.  Cc ??  ng tc di chuy?n l?p ?i l?p l?i v?i c??ng ?? l?n, ch?ng h?n nh? nm bng chy ho?c c nh?ng c?n ho d? d?i. TRI?U CH?NG  ?au khi ht vo ho?c ho.  ?au khi ai ? ?n vo vng b? th??ng t?n. CH?N ?ON Chuyn gia ch?m [place] s?c kh?e s? khm th?c th?. C th? c?n ph?i th?c hi?n cc ki?m tra hnh ?nh khc nhau ?? kh?ng ??nh ch?n ?on v tm cc th??ng t?n lin quan. Cc ki?m tra ny c th? bao g?m ch?p X-quang ng?c, ch?p c?t l?p ?i?n ton (CT), ch?p c?ng h??ng t? (MRI), ho?c ch?p qut x??ng. ?I?U TR? Cc ch? gy ? x??ng s??n th??ng s? t? li?n trong 1 - 3 thng. Th?i gian li?n x??ng lu h?n th??ng lin quan ??n ho lin t?c ho?c c nh?ng ho?t ??ng khc lm th??ng t?n tr?m tr?ng thm. Trong gian ?o?n li?n x??ng, gi?m ?au ?ng vai tr r?t quan tr?ng. Thu?c th??ng ???c dng ?? gi?m ?au. C th? c?n ph?i nh?p vi?n ho?c ph?u thu?t ??i v?i cc th??ng t?n n?ng, nh? trong tr??ng h?p nhi?u x??ng s??n b? gy ho?c x??ng s??n b? tr?ch kh?i v? tr. H??NG D?N CH?M [place] T?I NH  Trnh ho?t ??ng g?ng s?c v b?t k? ho?t ??ng ho?c v?n ??ng no gy ?au. C?n th?n trong cc ho?t ??ng v trnh va vo x??ng s??n b? th??ng t?n.  T?ng d?n ho?t ??ng theo ch? d?n c?a chuyn gia ch?m [place] s?c kh?e.  Ch? s? d?ng thu?c khng c?n k ??n ho?c thu?c c?n k ??n theo ch? d?n c?a chuyn gia ch?m [place] s?c kh?e. Khng dng cc  lo?i thu?c khc m khng h?i  ki?n c?a chuyn gia ch?m [place] s?c kh?e tr??c.  Ch??m ? vo vng b? th??ng t?n trong 1 - 2 ngy ??u sau khi b?n ? ???c ?i?u tr? ho?c theo ch? d?n c?a chuyn gia ch?m [place] s?c kh?e. Ch??m ? s? gip lm gi?m vim v gi?m ?au. ? Cho ? vo ti nh?a. ? ?? kh?n t?m [place] v ti. ? ?? ? ch??m trong 15 - 20 pht m?t l?n, 2 gi? m?t l?n khi th?c.  Ht th? su theo ch? d?n c?a chuyn gia ch?m [place] s?c kh?e. Vi?c ny s? gip ng?n ng?a b?nh vim ph?i, m?t bi?n ch?ng ph? bi?n c?a x??ng s??n gy. Chuyn gia ch?m [place] s?c kh?e c th? h??ng d?n b?n: ? Ht th? su vi l?n m?i ngy. ? C? g?ng ho vi l?n m?i ngy trong khi p m?t ci g?i vo vng b? th??ng t?n ? Dng m?t d?ng c? c tn l ph? dung k? khuy?n khch ?? t?p ht th? su vi l?n m?i ngy.  U?ng ?? n??c ?? gi? cho n??c ti?u trong ho?c vng nh?t. ?i?u ny s? gip b?n trnh b? to bn.  Khng ?eo ?ai ho?c b x??ng s??n. Nh?ng v?t ny lm h?n ch? h h?p, c th? d?n ??n vim ph?i. HY XIN ???C CH?M [place] T? NGAY L?P T?C N?U:  B?n b? s?t.  B?n b? kh th? ho?c th? d?c.  B?n b? ho lin t?c, ho?c b?n ho ra ??m ??c ho?c c mu.  B?n c?m th?y kh ch?u trong d? dy (bu?n nn), nn ra (nn), ho?c b? ?au b?ng.  B?n b? ?au tr?m tr?ng thm, khng ki?m sot ???c b?ng thu?c. ??M B?O B?N:  Hi?u cc h??ng d?n ny.  S? theo di tnh tr?ng c?a mnh.  S? yu c?u tr?  gip ngay l?p t?c n?u b?n c?m th?y khng kh?e ho?c th?y tr?m tr?ng h?n. Thng tin ny khng nh?m m?c ?ch thay th? cho l?i khuyn m chuyn gia ch?m [place] s?c kh?e ni v?i qu v?. Hy b?o ??m qu v? ph?i th?o lu?n b?t k? v?n ?? g m qu v? c v?i chuyn gia ch?m [place] s?c kh?e c?a qu v?. Document Released: 08/24/2005 Document Revised: 04/26/2013 Document Reviewed: 10/26/2012 Elsevier Interactive Patient Education  2018 Elsevier Inc.      Agustina Caroli, MD [facility]

## 2017-11-24 ENCOUNTER — Other Ambulatory Visit: Payer: Self-pay

## 2017-11-24 ENCOUNTER — Encounter: Payer: Self-pay | Admitting: Emergency Medicine

## 2017-11-24 ENCOUNTER — Ambulatory Visit (INDEPENDENT_AMBULATORY_CARE_PROVIDER_SITE_OTHER): Payer: Medicare HMO | Admitting: Emergency Medicine

## 2017-11-24 VITALS — BP 138/69 | HR 72 | Temp 97.5°F | Resp 16 | Ht 64.0 in | Wt 123.0 lb

## 2017-11-24 DIAGNOSIS — S2242XD Multiple fractures of ribs, left side, subsequent encounter for fracture with routine healing: Secondary | ICD-10-CM

## 2017-11-24 MED ORDER — TRAMADOL HCL 50 MG PO TABS: 50.0000 mg | ORAL_TABLET | Freq: Three times a day (TID) | ORAL | 0 refills | Status: DC | PRN

## 2017-11-24 NOTE — Patient Instructions (Addendum)
IF you received an x-ray today, you will receive an invoice from [facility] Radiology. Please contact [facility] Radiology at [phone] with questions or concerns regarding your invoice.   IF you received labwork today, you will receive an invoice from [place]. Please contact [facility] at [phone] with questions or concerns regarding your invoice.   Our billing staff will not be able to assist you with questions regarding bills from these companies.  You will be contacted with the lab results as soon as they are available. The fastest way to get your results is to activate your My Chart account. Instructions are located on the last page of this paperwork. If you have not heard from [place] regarding the results in 2 weeks, please contact this office.     Gy x??ng s??n (Rib Fracture) Gy x??ng s??n l gy ho?c n?t m?t trong s? cc x??ng s??n. X??ng s??n l m?t nhm x??ng di, cong, bao quanh l?ng ng?c v g?n vo c?t s?ng c?a b?n. Chng b?o v? ph?i v cc c? quan khc trong khoang ng?c c?a b?n. X??ng s??n b? gy ho?c n?t th??ng gy ?au ??n, nh?ng h?u nh? khng gy ra nh?ng v?n ?? khc. Theo th?i gian, h?u h?t cc ch? gy c?a x??ng s??n s? li?n. Tuy nhin, gy x??ng s??n c th? nghim tr?ng h?n n?u c nhi?u x??ng s??n b? gy ho?c n?u cc x??ng s??n b? gy tr?ch ra kh?i v? tr v ?m vo cc c?u trc khc. NGUYN NHN  M?t c ?nh tr?c di?n vo ng?c. V d?, ?i?u ny c th? x?y ra trong cc mn th? thao ??i khng, tai n?n  t, ho?c ng ??p vo m?t v?t c?ng.  Cc ??ng tc di chuy?n l?p ?i l?p l?i v?i c??ng ?? l?n, ch?ng h?n nh? nm bng chy ho?c c nh?ng c?n ho d? d?i. TRI?U CH?NG  ?au khi ht vo ho?c ho.  ?au khi ai ? ?n vo vng b? th??ng t?n. CH?N ?ON Chuyn gia ch?m [place] s?c kh?e s? khm th?c th?. C th? c?n ph?i th?c hi?n cc ki?m tra hnh ?nh khc nhau ?? kh?ng ??nh ch?n ?on v tm cc th??ng t?n lin quan. Cc ki?m tra ny c th? bao g?m ch?p X-quang ng?c, ch?p c?t l?p ?i?n  ton (CT), ch?p c?ng h??ng t? (MRI), ho?c ch?p qut x??ng. ?I?U TR? Cc ch? gy ? x??ng s??n th??ng s? t? li?n trong 1 - 3 thng. Th?i gian li?n x??ng lu h?n th??ng lin quan ??n ho lin t?c ho?c c nh?ng ho?t ??ng khc lm th??ng t?n tr?m tr?ng thm. Trong gian ?o?n li?n x??ng, gi?m ?au ?ng vai tr r?t quan tr?ng. Thu?c th??ng ???c dng ?? gi?m ?au. C th? c?n ph?i nh?p vi?n ho?c ph?u thu?t ??i v?i cc th??ng t?n n?ng, nh? trong tr??ng h?p nhi?u x??ng s??n b? gy ho?c x??ng s??n b? tr?ch kh?i v? tr. H??NG D?N CH?M [place] T?I NH  Trnh ho?t ??ng g?ng s?c v b?t k? ho?t ??ng ho?c v?n ??ng no gy ?au. C?n th?n trong cc ho?t ??ng v trnh va vo x??ng s??n b? th??ng t?n.  T?ng d?n ho?t ??ng theo ch? d?n c?a chuyn gia ch?m [place] s?c kh?e.  Ch? s? d?ng thu?c khng c?n k ??n ho?c thu?c c?n k ??n theo ch? d?n c?a chuyn gia ch?m [place] s?c kh?e. Khng dng cc lo?i thu?c khc m khng h?i  ki?n c?a chuyn gia ch?m [place] s?c kh?e tr??c.  Ch??m ? vo vng b? th??ng t?n  trong 1 - 2 ngy ??u sau khi b?n ? ???c ?i?u tr? ho?c theo ch? d?n c?a chuyn gia ch?m [place] s?c kh?e. Ch??m ? s? gip lm gi?m vim v gi?m ?au. ? Cho ? vo ti nh?a. ? ?? kh?n t?m [place] v ti. ? ?? ? ch??m trong 15 - 20 pht m?t l?n, 2 gi? m?t l?n khi th?c.  Ht th? su theo ch? d?n c?a chuyn gia ch?m [place] s?c kh?e. Vi?c ny s? gip ng?n ng?a b?nh vim ph?i, m?t bi?n ch?ng ph? bi?n c?a x??ng s??n gy. Chuyn gia ch?m [place] s?c kh?e c th? h??ng d?n b?n: ? Ht th? su vi l?n m?i ngy. ? C? g?ng ho vi l?n m?i ngy trong khi p m?t ci g?i vo vng b? th??ng t?n ? Dng m?t d?ng c? c tn l ph? dung k? khuy?n khch ?? t?p ht th? su vi l?n m?i ngy.  U?ng ?? n??c ?? gi? cho n??c ti?u trong ho?c vng nh?t. ?i?u ny s? gip b?n trnh b? to bn.  Khng ?eo ?ai ho?c b x??ng s??n. Nh?ng v?t ny lm h?n ch? h h?p, c th? d?n ??n vim ph?i. HY XIN ???C CH?M [place] Y T? NGAY L?P T?C N?U:  B?n b? s?t.  B?n b? kh th? ho?c th?  d?c.  B?n b? ho lin t?c, ho?c b?n ho ra ??m ??c ho?c c mu.  B?n c?m th?y kh ch?u trong d? dy (bu?n nn), nn ra (nn), ho?c b? ?au b?ng.  B?n b? ?au tr?m tr?ng thm, khng ki?m sot ???c b?ng thu?c. ??M B?O B?N:  Hi?u cc h??ng d?n ny.  S? theo di tnh tr?ng c?a mnh.  S? yu c?u tr? gip ngay l?p t?c n?u b?n c?m th?y khng kh?e ho?c th?y tr?m tr?ng h?n. Thng tin ny khng nh?m m?c ?ch thay th? cho l?i khuyn m chuyn gia ch?m [place] s?c kh?e ni v?i qu v?. Hy b?o ??m qu v? ph?i th?o lu?n b?t k? v?n ?? g m qu v? c v?i chuyn gia ch?m [place] s?c kh?e c?a qu v?. Document Released: 08/24/2005 Document Revised: 04/26/2013 Document Reviewed: 10/26/2012 Elsevier Interactive Patient Education  2018 [employer].

## 2017-11-24 NOTE — Progress Notes (Signed)
Erik Aguirre 77 y.o.   Chief Complaint  Patient presents with  . Rib Fracture    LEFT side    HISTORY OF PRESENT ILLNESS: This is a 77 y.o. male here for follow-up of multiple rib fractures on the left side sustained about 17 days ago.  Doing well.  Pain much improved.  No complications.  Has no new complaints.  HPI   Prior to Admission medications   Medication Sig Start Date End Date Taking? Authorizing Provider  alfuzosin (UROXATRAL) 10 MG 24 hr tablet Take 1 tablet (10 mg total) by mouth daily with breakfast. **Needs office visit while on med for any refills** 11/10/17 02/08/18 Yes , Ines Bloomer, MD  metFORMIN (GLUCOPHAGE) 1000 MG tablet Take 1 tablet (1,000 mg total) by mouth 2 (two) times daily with a meal. 12/07/16  Yes , Ines Bloomer, MD  Multiple Vitamin (MULTIVITAMIN) capsule Take 1 capsule by mouth daily. Reported on 01/15/2016   Yes [provider]  traMADol (ULTRAM) 50 MG tablet Take 1 tablet (50 mg total) by mouth every 8 (eight) hours as needed. 11/17/17  Yes Horald Pollen, MD    Allergies  Allergen Reactions  . Lisinopril Cough  . 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 Accidental fall 11/10/2017  . Rib pain on left side 11/10/2017  . Injury of chest wall 11/10/2017  . Multiple closed fractures of ribs of left side 11/10/2017  . Dyslipidemia 11/01/2014  . HTN (hypertension) 07/02/2014  . Diabetes mellitus with no complication ([place]) 23/55/7322  . 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 Benign prostatic hyperplasia (BPH) with urinary urgency 03/17/2013    Past Medical History:  Diagnosis Date  . Acute appendicitis s/p alp appy 11/27/2014 11/26/2014  . BPH (benign prostatic hyperplasia)   . Hyperlipidemia   . Hypertension   . 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 LAPAROSCOPIC  APPENDECTOMY N/A 11/27/2014   Procedure: APPENDECTOMY LAPAROSCOPIC ;  Surgeon: Michael Boston, MD;  Location: [facility];  Service: General;  Laterality: N/A;    Social History   Socioeconomic History  . Marital status: Married    Spouse name: Not on file  . Number of children: 5  . Years of education: Not on file  . Highest education level: Not on file  Social Needs  . Financial resource strain: Not on file  . Food insecurity - worry: Not on file  . Food insecurity - inability: Not on file  . Transportation needs - medical: Not on file  . Transportation needs - non-medical: Not on file  Occupational History  . Occupation: Retired  Tobacco Use  . Smoking status: Never Smoker  . Smokeless tobacco: Never Used  Substance and Sexual Activity  . Alcohol use: Yes    Comment: occ  . Drug use: No  . Sexual activity: Not on file  Other Topics Concern  . Not on file  Social History Narrative   Originally from [employer] [place].   Language of English okay    Family History  Problem Relation Age of Onset  . Throat cancer Brother   . Asthma Son      Review of Systems  Constitutional: Negative.  Negative for chills and fever.  HENT: Negative.   Eyes: Negative.   Respiratory: Negative.  Negative for cough, hemoptysis and shortness  of breath.   Cardiovascular: Positive for chest pain (Intermittent chest wall pain). Negative for palpitations.  Gastrointestinal: Negative.  Negative for diarrhea, nausea and vomiting.  Genitourinary: Negative.  Negative for hematuria.  Musculoskeletal: Negative.  Negative for back pain and joint pain.  Skin: Negative.  Negative for rash.  Neurological: Negative.  Negative for headaches.  Endo/Heme/Allergies: Negative.   All other systems reviewed and are negative.   Vitals:   11/24/17 1414  BP: 138/69  Pulse: 72  Resp: 16  Temp: (!) 97.5 F (36.4 C)  SpO2: 98%    Physical Exam  Constitutional: He is oriented to person, place, and  time. He appears well-developed and well-nourished.  HENT:  Head: Normocephalic and atraumatic.  Nose: Nose normal.  Mouth/Throat: Oropharynx is clear and moist.  Eyes: Pupils are equal, round, and reactive to light.  Neck: Normal range of motion. Neck supple.  Cardiovascular: Normal rate, regular rhythm and normal heart sounds.  Pulmonary/Chest: Effort normal and breath sounds normal. He exhibits tenderness (Left lateral rib cage).  Abdominal: Soft. There is no tenderness.  Musculoskeletal: Normal range of motion.  Neurological: He is alert and oriented to person, place, and time.  Skin: Capillary refill takes less than 2 seconds.  No ecchymosis around rib fracture area.  Psychiatric: He has a normal mood and affect. His behavior is normal.  Vitals reviewed.    ASSESSMENT & PLAN: Erik Aguirre was seen today for rib fracture.  Diagnoses and all orders for this visit:  Closed fracture of multiple ribs of left side with routine healing, subsequent encounter  Other orders -     traMADol (ULTRAM) 50 MG tablet; Take 1 tablet (50 mg total) by mouth every 8 (eight) hours as needed.   A total of 25 minutes was spent in the room with the patient, greater than 50% of which was in counseling/coordination of care.   Patient Instructions       IF you received an x-ray today, you will receive an invoice from [facility] Radiology. Please contact [facility] Radiology at [phone] with questions or concerns regarding your invoice.   IF you received labwork today, you will receive an invoice from [place]. Please contact [facility] at [phone] with questions or concerns regarding your invoice.   Our billing staff will not be able to assist you with questions regarding bills from these companies.  You will be contacted with the lab results as soon as they are available. The fastest way to get your results is to activate your My Chart account. Instructions are located on the last page of this  paperwork. If you have not heard from [place] regarding the results in 2 weeks, please contact this office.     Gy x??ng s??n (Rib Fracture) Gy x??ng s??n l gy ho?c n?t m?t trong s? cc x??ng s??n. X??ng s??n l m?t nhm x??ng di, cong, bao quanh l?ng ng?c v g?n vo c?t s?ng c?a b?n. Chng b?o v? ph?i v cc c? quan khc trong khoang ng?c c?a b?n. X??ng s??n b? gy ho?c n?t th??ng gy ?au ??n, nh?ng h?u nh? khng gy ra nh?ng v?n ?? khc. Theo th?i gian, h?u h?t cc ch? gy c?a x??ng s??n s? li?n. Tuy nhin, gy x??ng s??n c th? nghim tr?ng h?n n?u c nhi?u x??ng s??n b? gy ho?c n?u cc x??ng s??n b? gy tr?ch ra kh?i v? tr v ?m vo cc c?u trc khc. NGUYN NHN  M?t c ?nh tr?c di?n vo ng?c. V d?, ?i?u ny c  th? x?y ra trong cc mn th? thao ??i khng, tai n?n  t, ho?c ng ??p vo m?t v?t c?ng.  Cc ??ng tc di chuy?n l?p ?i l?p l?i v?i c??ng ?? l?n, ch?ng h?n nh? nm bng chy ho?c c nh?ng c?n ho d? d?i. TRI?U CH?NG  ?au khi ht vo ho?c ho.  ?au khi ai ? ?n vo vng b? th??ng t?n. CH?N ?ON Chuyn gia ch?m [place] s?c kh?e s? khm th?c th?. C th? c?n ph?i th?c hi?n cc ki?m tra hnh ?nh khc nhau ?? kh?ng ??nh ch?n ?on v tm cc th??ng t?n lin quan. Cc ki?m tra ny c th? bao g?m ch?p X-quang ng?c, ch?p c?t l?p ?i?n ton (CT), ch?p c?ng h??ng t? (MRI), ho?c ch?p qut x??ng. ?I?U TR? Cc ch? gy ? x??ng s??n th??ng s? t? li?n trong 1 - 3 thng. Th?i gian li?n x??ng lu h?n th??ng lin quan ??n ho lin t?c ho?c c nh?ng ho?t ??ng khc lm th??ng t?n tr?m tr?ng thm. Trong gian ?o?n li?n x??ng, gi?m ?au ?ng vai tr r?t quan tr?ng. Thu?c th??ng ???c dng ?? gi?m ?au. C th? c?n ph?i nh?p vi?n ho?c ph?u thu?t ??i v?i cc th??ng t?n n?ng, nh? trong tr??ng h?p nhi?u x??ng s??n b? gy ho?c x??ng s??n b? tr?ch kh?i v? tr. H??NG D?N CH?M [place] T?I NH  Trnh ho?t ??ng g?ng s?c v b?t k? ho?t ??ng ho?c v?n ??ng no gy ?au. C?n th?n trong cc ho?t ??ng v trnh va vo x??ng s??n b?  th??ng t?n.  T?ng d?n ho?t ??ng theo ch? d?n c?a chuyn gia ch?m [place] s?c kh?e.  Ch? s? d?ng thu?c khng c?n k ??n ho?c thu?c c?n k ??n theo ch? d?n c?a chuyn gia ch?m [place] s?c kh?e. Khng dng cc lo?i thu?c khc m khng h?i  ki?n c?a chuyn gia ch?m [place] s?c kh?e tr??c.  Ch??m ? vo vng b? th??ng t?n trong 1 - 2 ngy ??u sau khi b?n ? ???c ?i?u tr? ho?c theo ch? d?n c?a chuyn gia ch?m [place] s?c kh?e. Ch??m ? s? gip lm gi?m vim v gi?m ?au. ? Cho ? vo ti nh?a. ? ?? kh?n t?m [place] v ti. ? ?? ? ch??m trong 15 - 20 pht m?t l?n, 2 gi? m?t l?n khi th?c.  Ht th? su theo ch? d?n c?a chuyn gia ch?m [place] s?c kh?e. Vi?c ny s? gip ng?n ng?a b?nh vim ph?i, m?t bi?n ch?ng ph? bi?n c?a x??ng s??n gy. Chuyn gia ch?m [place] s?c kh?e c th? h??ng d?n b?n: ? Ht th? su vi l?n m?i ngy. ? C? g?ng ho vi l?n m?i ngy trong khi p m?t ci g?i vo vng b? th??ng t?n ? Dng m?t d?ng c? c tn l ph? dung k? khuy?n khch ?? t?p ht th? su vi l?n m?i ngy.  U?ng ?? n??c ?? gi? cho n??c ti?u trong ho?c vng nh?t. ?i?u ny s? gip b?n trnh b? to bn.  Khng ?eo ?ai ho?c b x??ng s??n. Nh?ng v?t ny lm h?n ch? h h?p, c th? d?n ??n vim ph?i. HY XIN ???C CH?M  Y T? NGAY L?P T?C N?U:  B?n b? s?t.  B?n b? kh th? ho?c th? d?c.  B?n b? ho lin t?c, ho?c b?n ho ra ??m ??c ho?c c mu.  B?n c?m th?y kh ch?u trong d? dy (bu?n nn), nn ra (nn), ho?c b? ?au b?ng.  B?n b? ?au tr?m tr?ng thm, khng ki?m sot ???c b?ng  thu?c. ??M B?O B?N:  Hi?u cc h??ng d?n ny.  S? theo di tnh tr?ng c?a mnh.  S? yu c?u tr? gip ngay l?p t?c n?u b?n c?m th?y khng kh?e ho?c th?y tr?m tr?ng h?n. Thng tin ny khng nh?m m?c ?ch thay th? cho l?i khuyn m chuyn gia ch?m [place] s?c kh?e ni v?i qu v?. Hy b?o ??m qu v? ph?i th?o lu?n b?t k? v?n ?? g m qu v? c v?i chuyn gia ch?m [place] s?c kh?e c?a qu v?. Document Released: 08/24/2005 Document Revised: 04/26/2013 Document Reviewed:  10/26/2012 [employer] Interactive Patient Education  2018 [employer]     Agustina Caroli, MD [facility]

## 2017-12-08 ENCOUNTER — Ambulatory Visit (INDEPENDENT_AMBULATORY_CARE_PROVIDER_SITE_OTHER): Payer: Medicare HMO | Admitting: Emergency Medicine

## 2017-12-08 ENCOUNTER — Encounter: Payer: Self-pay | Admitting: Emergency Medicine

## 2017-12-08 ENCOUNTER — Ambulatory Visit (INDEPENDENT_AMBULATORY_CARE_PROVIDER_SITE_OTHER): Payer: Medicare HMO

## 2017-12-08 VITALS — BP 142/79 | HR 69 | Temp 98.5°F | Resp 16 | Wt 123.4 lb

## 2017-12-08 DIAGNOSIS — S2242XD Multiple fractures of ribs, left side, subsequent encounter for fracture with routine healing: Secondary | ICD-10-CM | POA: Diagnosis not present

## 2017-12-08 DIAGNOSIS — S2232XA Fracture of one rib, left side, initial encounter for closed fracture: Secondary | ICD-10-CM | POA: Diagnosis not present

## 2017-12-08 NOTE — Patient Instructions (Addendum)
IF you received an x-ray today, you will receive an invoice from [facility]. Please contact [facility] at [phone] with questions or concerns regarding your invoice.   IF you received labwork today, you will receive an invoice from [facility]. Please contact [facility] at [phone] with questions or concerns regarding your invoice.   Our billing staff will not be able to assist you with questions regarding bills from these companies.  You will be contacted with the lab results as soon as they are available. The fastest way to get your results is to activate your My Chart account. Instructions are located on the last page of this paperwork. If you have not heard from [place] regarding the results in 2 weeks, please contact this office.     Gy x??ng s??n (Rib Fracture) Gy x??ng s??n l gy ho?c n?t m?t trong s? cc x??ng s??n. X??ng s??n l m?t nhm x??ng di, cong, bao quanh l?ng ng?c v g?n vo c?t s?ng c?a b?n. Chng b?o v? ph?i v cc c? quan khc trong khoang ng?c c?a b?n. X??ng s??n b? gy ho?c n?t th??ng gy ?au ??n, nh?ng h?u nh? khng gy ra nh?ng v?n ?? khc. Theo th?i gian, h?u h?t cc ch? gy c?a x??ng s??n s? li?n. Tuy nhin, gy x??ng s??n c th? nghim tr?ng h?n n?u c nhi?u x??ng s??n b? gy ho?c n?u cc x??ng s??n b? gy tr?ch ra kh?i v? tr v ?m vo cc c?u trc khc. NGUYN NHN  M?t c ?nh tr?c di?n vo ng?c. V d?, ?i?u ny c th? x?y ra trong cc mn th? thao ??i khng, tai n?n  t, ho?c ng ??p vo m?t v?t c?ng.  Cc ??ng tc di chuy?n l?p ?i l?p l?i v?i c??ng ?? l?n, ch?ng h?n nh? nm bng chy ho?c c nh?ng c?n ho d? d?i. TRI?U CH?NG  ?au khi ht vo ho?c ho.  ?au khi ai ? ?n vo vng b? th??ng t?n. CH?N ?ON Chuyn gia ch?m [place] s?c kh?e s? khm th?c th?. C th? c?n ph?i th?c hi?n cc ki?m tra hnh ?nh khc nhau ?? kh?ng ??nh ch?n ?on v tm cc th??ng t?n lin quan. Cc ki?m tra ny c th? bao g?m ch?p X-quang ng?c, ch?p c?t l?p ?i?n  ton (CT), ch?p c?ng h??ng t? (MRI), ho?c ch?p qut x??ng. ?I?U TR? Cc ch? gy ? x??ng s??n th??ng s? t? li?n trong 1 - 3 thng. Th?i gian li?n x??ng lu h?n th??ng lin quan ??n ho lin t?c ho?c c nh?ng ho?t ??ng khc lm th??ng t?n tr?m tr?ng thm. Trong gian ?o?n li?n x??ng, gi?m ?au ?ng vai tr r?t quan tr?ng. Thu?c th??ng ???c dng ?? gi?m ?au. C th? c?n ph?i nh?p vi?n ho?c ph?u thu?t ??i v?i cc th??ng t?n n?ng, nh? trong tr??ng h?p nhi?u x??ng s??n b? gy ho?c x??ng s??n b? tr?ch kh?i v? tr. H??NG D?N CH?M [place] T?I NH  Trnh ho?t ??ng g?ng s?c v b?t k? ho?t ??ng ho?c v?n ??ng no gy ?au. C?n th?n trong cc ho?t ??ng v trnh va vo x??ng s??n b? th??ng t?n.  T?ng d?n ho?t ??ng theo ch? d?n c?a chuyn gia ch?m [place] s?c kh?e.  Ch? s? d?ng thu?c khng c?n k ??n ho?c thu?c c?n k ??n theo ch? d?n c?a chuyn gia ch?m [place] s?c kh?e. Khng dng cc lo?i thu?c khc m khng h?i  ki?n c?a chuyn gia ch?m [place] s?c kh?e tr??c.  Ch??m ? vo vng b? th??ng t?n  trong 1 - 2 ngy ??u sau khi b?n ? ???c ?i?u tr? ho?c theo ch? d?n c?a chuyn gia ch?m [place] s?c kh?e. Ch??m ? s? gip lm gi?m vim v gi?m ?au. ? Cho ? vo ti nh?a. ? ?? kh?n t?m [place] v ti. ? ?? ? ch??m trong 15 - 20 pht m?t l?n, 2 gi? m?t l?n khi th?c.  Ht th? su theo ch? d?n c?a chuyn gia ch?m [place] s?c kh?e. Vi?c ny s? gip ng?n ng?a b?nh vim ph?i, m?t bi?n ch?ng ph? bi?n c?a x??ng s??n gy. Chuyn gia ch?m [place] s?c kh?e c th? h??ng d?n b?n: ? Ht th? su vi l?n m?i ngy. ? C? g?ng ho vi l?n m?i ngy trong khi p m?t ci g?i vo vng b? th??ng t?n ? Dng m?t d?ng c? c tn l ph? dung k? khuy?n khch ?? t?p ht th? su vi l?n m?i ngy.  U?ng ?? n??c ?? gi? cho n??c ti?u trong ho?c vng nh?t. ?i?u ny s? gip b?n trnh b? to bn.  Khng ?eo ?ai ho?c b x??ng s??n. Nh?ng v?t ny lm h?n ch? h h?p, c th? d?n ??n vim ph?i. HY XIN ???C CH?M [place] T? NGAY L?P T?C N?U:  B?n b? s?t.  B?n b? kh th? ho?c th?  d?c.  B?n b? ho lin t?c, ho?c b?n ho ra ??m ??c ho?c c mu.  B?n c?m th?y kh ch?u trong d? dy (bu?n nn), nn ra (nn), ho?c b? ?au b?ng.  B?n b? ?au tr?m tr?ng thm, khng ki?m sot ???c b?ng thu?c. ??M B?O B?N:  Hi?u cc h??ng d?n ny.  S? theo di tnh tr?ng c?a mnh.  S? yu c?u tr? gip ngay l?p t?c n?u b?n c?m th?y khng kh?e ho?c th?y tr?m tr?ng h?n. Thng tin ny khng nh?m m?c ?ch thay th? cho l?i khuyn m chuyn gia ch?m [place] s?c kh?e ni v?i qu v?. Hy b?o ??m qu v? ph?i th?o lu?n b?t k? v?n ?? g m qu v? c v?i chuyn gia ch?m  s?c kh?e c?a qu v?. Document Released: 08/24/2005 Document Revised: 04/26/2013 Document Reviewed: 10/26/2012 Elsevier Interactive Patient Education  2018 [employer].

## 2017-12-08 NOTE — Progress Notes (Signed)
Erik Aguirre 77 y.o.   Chief Complaint  Patient presents with  . folow up ribs    HISTORY OF PRESENT ILLNESS: This is a 77 y.o. male here for follow-up of multiple broken ribs on the left side sustained 4 weeks ago.  Doing better.  Feeling better.  No new symptoms.  HPI   Prior to Admission medications   Medication Sig Start Date End Date Taking? Authorizing Provider  alfuzosin (UROXATRAL) 10 MG 24 hr tablet Take 1 tablet (10 mg total) by mouth daily with breakfast. **Needs office visit while on med for any refills** 11/10/17 02/08/18 Yes ,  Jose, MD  metFORMIN (GLUCOPHAGE) 1000 MG tablet Take 1 tablet (1,000 mg total) by mouth 2 (two) times daily with a meal. 12/07/16  Yes ,  Jose, MD  Multiple Vitamin (MULTIVITAMIN) capsule Take 1 capsule by mouth daily. Reported on 01/15/2016   Yes [provider]  traMADol (ULTRAM) 50 MG tablet Take 1 tablet (50 mg total) by mouth every 8 (eight) hours as needed. 11/24/17  Yes ,  Jose, MD    Allergies  Allergen Reactions  . Lisinopril Cough  . 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 Accidental fall 11/10/2017  . Rib pain on left side 11/10/2017  . Injury of chest wall 11/10/2017  . Multiple closed fractures of ribs of left side 11/10/2017  . Dyslipidemia 11/01/2014  . HTN (hypertension) 07/02/2014  . Diabetes mellitus with no complication (HCC) 07/02/2014  . 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 Benign prostatic hyperplasia (BPH) with urinary urgency 03/17/2013    Past Medical History:  Diagnosis Date  . Acute appendicitis s/p alp appy 11/27/2014 11/26/2014  . BPH (benign prostatic hyperplasia)   . Hyperlipidemia   . Hypertension   . 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 LAPAROSCOPIC APPENDECTOMY N/A 11/27/2014   Procedure:  APPENDECTOMY LAPAROSCOPIC ;  Surgeon: Erik Gross, MD;  Location: [facility];  Service: General;  Laterality: N/A;    Social History   Socioeconomic History  . Marital status: Married    Spouse name: Not on file  . Number of children: 5  . Years of education: Not on file  . Highest education level: Not on file  Occupational History  . Occupation: Retired  Social Needs  . Financial resource strain: Not on file  . Food insecurity:    Worry: Not on file    Inability: Not on file  . Transportation needs:    Medical: Not on file    Non-medical: Not on file  Tobacco Use  . Smoking status: Never Smoker  . Smokeless tobacco: Never Used  Substance and Sexual Activity  . Alcohol use: Yes    Comment: occ  . Drug use: No  . Sexual activity: Not on file  Lifestyle  . Physical activity:    Days per week: Not on file    Minutes per session: Not on file  . Stress: Not on file  Relationships  . Social connections:    Talks on phone: Not on file    Gets together: Not on file    Attends religious service: Not on file    Active member of club or organization: Not on file    Attends meetings of clubs or organizations: Not on file    Relationship status: Not on file  .   Intimate partner violence:    Fear of current or ex partner: Not on file    Emotionally abused: Not on file    Physically abused: Not on file    Forced sexual activity: Not on file  Other Topics Concern  . Not on file  Social History Narrative   Originally [place].   Language of English okay    Family History  Problem Relation Age of Onset  . Throat cancer Brother   . Asthma Son      Review of Systems  Constitutional: Negative.  Negative for chills and fever.  Respiratory: Negative.  Negative for cough and shortness of breath.   Cardiovascular: Negative.  Negative for chest pain.  Gastrointestinal: Negative.  Negative for abdominal pain, nausea and vomiting.  Genitourinary: Negative  for hematuria.  Skin: Negative.  Negative for rash.  Neurological: Negative.  Negative for dizziness and headaches.  All other systems reviewed and are negative.   Vitals:   12/08/17 1411  BP: (!) 142/79  Pulse: 69  Resp: 16  Temp: 98.5 F (36.9 C)  SpO2: 95%    Physical Exam  Constitutional: He is oriented to person, place, and time. He appears well-developed and well-nourished.  HENT:  Head: Normocephalic and atraumatic.  Eyes: Pupils are equal, round, and reactive to light. EOM are normal.  Neck: Normal range of motion. Neck supple.  Cardiovascular: Normal rate.  Pulmonary/Chest: Effort normal and breath sounds normal. No respiratory distress.  Abdominal: There is no tenderness.  Neurological: He is alert and oriented to person, place, and time.  Skin: Skin is warm and dry. Capillary refill takes less than 2 seconds.  Psychiatric: He has a normal mood and affect. His behavior is normal.  Vitals reviewed.    Dg Ribs Unilateral W/chest Left  Result Date: 12/08/2017 CLINICAL DATA:  Rib fractures 1 month ago EXAM: LEFT RIBS AND CHEST - 3+ VIEW COMPARISON:  11/10/2017 FINDINGS: Normal heart size and pulmonary vascularity. Atherosclerotic calcification and mild tortuosity of thoracic aorta. Eventration RIGHT diaphragm stable. Minimal residual atelectasis and minimal effusion at LEFT costophrenic angle. Remaining lungs clear. No infiltrate or pneumothorax. Bones demineralized. Healing fractures of the LEFT third fourth fifth sixth and seventh rib fractures identified. IMPRESSION: Multiple healing LEFT rib fractures with decrease in LEFT pleural effusion and basilar atelectasis since prior study. Electronically Signed   By: Erik  Aguirre M.D.   On: 12/08/2017 14:50   A total of 25 minutes was spent in the room with the patient, greater than 50% of which was in counseling/coordination of care.   ASSESSMENT & PLAN: Erik Aguirre was seen today for folow up ribs.  Diagnoses and all orders for  this visit:  Closed fracture of multiple ribs of left side with routine healing, subsequent encounter -     DG Ribs Unilateral W/Chest Left; Future    Patient Instructions       IF you received an x-ray today, you will receive an invoice from [facility]. Please contact [facility] at [phone] with questions or concerns regarding your invoice.   IF you received labwork today, you will receive an invoice from [facility]. Please contact [facility] at [phone] with questions or concerns regarding your invoice.   Our billing staff will not be able to assist you with questions regarding bills from these companies.  You will be contacted with the lab results as soon as they are available. The fastest way to get your results is to activate   your My Chart account. Instructions are located on the last page of this paperwork. If you have not heard from [place] regarding the results in 2 weeks, please contact this office.     Gy x??ng s??n (Rib Fracture) Gy x??ng s??n l gy ho?c n?t m?t trong s? cc x??ng s??n. X??ng s??n l m?t nhm x??ng di, cong, bao quanh l?ng ng?c v g?n vo c?t s?ng c?a b?n. Chng b?o v? ph?i v cc c? quan khc trong khoang ng?c c?a b?n. X??ng s??n b? gy ho?c n?t th??ng gy ?au ??n, nh?ng h?u nh? khng gy ra nh?ng v?n ?? khc. Theo th?i gian, h?u h?t cc ch? gy c?a x??ng s??n s? li?n. Tuy nhin, gy x??ng s??n c th? nghim tr?ng h?n n?u c nhi?u x??ng s??n b? gy ho?c n?u cc x??ng s??n b? gy tr?ch ra kh?i v? tr v ?m vo cc c?u trc khc. NGUYN NHN  M?t c ?nh tr?c di?n vo ng?c. V d?, ?i?u ny c th? x?y ra trong cc mn th? thao ??i khng, tai n?n  t, ho?c ng ??p vo m?t v?t c?ng.  Cc ??ng tc di chuy?n l?p ?i l?p l?i v?i c??ng ?? l?n, ch?ng h?n nh? nm bng chy ho?c c nh?ng c?n ho d? d?i. TRI?U CH?NG  ?au khi ht vo ho?c ho.  ?au khi ai ? ?n vo vng b? th??ng t?n. CH?N ?ON Chuyn gia ch?m [place] s?c kh?e s? khm th?c th?. C  th? c?n ph?i th?c hi?n cc ki?m tra hnh ?nh khc nhau ?? kh?ng ??nh ch?n ?on v tm cc th??ng t?n lin quan. Cc ki?m tra ny c th? bao g?m ch?p X-quang ng?c, ch?p c?t l?p ?i?n ton (CT), ch?p c?ng h??ng t? (MRI), ho?c ch?p qut x??ng. ?I?U TR? Cc ch? gy ? x??ng s??n th??ng s? t? li?n trong 1 - 3 thng. Th?i gian li?n x??ng lu h?n th??ng lin quan ??n ho lin t?c ho?c c nh?ng ho?t ??ng khc lm th??ng t?n tr?m tr?ng thm. Trong gian ?o?n li?n x??ng, gi?m ?au ?ng vai tr r?t quan tr?ng. Thu?c th??ng ???c dng ?? gi?m ?au. C th? c?n ph?i nh?p vi?n ho?c ph?u thu?t ??i v?i cc th??ng t?n n?ng, nh? trong tr??ng h?p nhi?u x??ng s??n b? gy ho?c x??ng s??n b? tr?ch kh?i v? tr. H??NG D?N CH?M [place] T?I NH  Trnh ho?t ??ng g?ng s?c v b?t k? ho?t ??ng ho?c v?n ??ng no gy ?au. C?n th?n trong cc ho?t ??ng v trnh va vo x??ng s??n b? th??ng t?n.  T?ng d?n ho?t ??ng theo ch? d?n c?a chuyn gia ch?m [place] s?c kh?e.  Ch? s? d?ng thu?c khng c?n k ??n ho?c thu?c c?n k ??n theo ch? d?n c?a chuyn gia ch?m [place] s?c kh?e. Khng dng cc lo?i thu?c khc m khng h?i  ki?n c?a chuyn gia ch?m [place] s?c kh?e tr??c.  Ch??m ? vo vng b? th??ng t?n trong 1 - 2 ngy ??u sau khi b?n ? ???c ?i?u tr? ho?c theo ch? d?n c?a chuyn gia ch?m [place] s?c kh?e. Ch??m ? s? gip lm gi?m vim v gi?m ?au. ? Cho ? vo ti nh?a. ? ?? kh?n t?m [place] v ti. ? ?? ? ch??m trong 15 - 20 pht m?t l?n, 2 gi? m?t l?n khi th?c.  Ht th? su theo ch? d?n c?a chuyn gia ch?m [place] s?c kh?e. Vi?c ny s? gip ng?n ng?a b?nh vim ph?i, m?t bi?n ch?ng ph? bi?n c?a x??ng s??n gy. Chuyn gia  ch?m [place] s?c kh?e c th? h??ng d?n b?n: ? Ht th? su vi l?n m?i ngy. ? C? g?ng ho vi l?n m?i ngy trong khi p m?t ci g?i vo vng b? th??ng t?n ? Dng m?t d?ng c? c tn l ph? dung k? khuy?n khch ?? t?p ht th? su vi l?n m?i ngy.  U?ng ?? n??c ?? gi? cho n??c ti?u trong ho?c vng nh?t. ?i?u ny s? gip b?n trnh b? to  bn.  Khng ?eo ?ai ho?c b x??ng s??n. Nh?ng v?t ny lm h?n ch? h h?p, c th? d?n ??n vim ph?i. HY XIN ???C CH?M [place] Y T? NGAY L?P T?C N?U:  B?n b? s?t.  B?n b? kh th? ho?c th? d?c.  B?n b? ho lin t?c, ho?c b?n ho ra ??m ??c ho?c c mu.  B?n c?m th?y kh ch?u trong d? dy (bu?n nn), nn ra (nn), ho?c b? ?au b?ng.  B?n b? ?au tr?m tr?ng thm, khng ki?m sot ???c b?ng thu?c. ??M B?O B?N:  Hi?u cc h??ng d?n ny.  S? theo di tnh tr?ng c?a mnh.  S? yu c?u tr? gip ngay l?p t?c n?u b?n c?m th?y khng kh?e ho?c th?y tr?m tr?ng h?n. Thng tin ny khng nh?m m?c ?ch thay th? cho l?i khuyn m chuyn gia ch?m [place] s?c kh?e ni v?i qu v?. Hy b?o ??m qu v? ph?i th?o lu?n b?t k? v?n ?? g m qu v? c v?i chuyn gia ch?m [place] s?c kh?e c?a qu v?. Document Released: 08/24/2005 Document Revised: 04/26/2013 Document Reviewed: 10/26/2012 Elsevier Interactive Patient Education  2018 [employer]      Agustina Caroli, MD [facility]

## 2017-12-10 NOTE — Telephone Encounter (Signed)
Erroneous

## 2018-01-03 ENCOUNTER — Other Ambulatory Visit: Payer: Self-pay | Admitting: Emergency Medicine

## 2018-01-05 ENCOUNTER — Encounter: Payer: Self-pay | Admitting: Emergency Medicine

## 2018-01-05 ENCOUNTER — Ambulatory Visit (INDEPENDENT_AMBULATORY_CARE_PROVIDER_SITE_OTHER): Payer: Medicare HMO | Admitting: Emergency Medicine

## 2018-01-05 ENCOUNTER — Other Ambulatory Visit: Payer: Self-pay

## 2018-01-05 VITALS — BP 130/76 | HR 63 | Temp 97.6°F | Resp 16 | Wt 120.2 lb

## 2018-01-05 DIAGNOSIS — S2242XD Multiple fractures of ribs, left side, subsequent encounter for fracture with routine healing: Secondary | ICD-10-CM | POA: Diagnosis not present

## 2018-01-05 DIAGNOSIS — E119 Type 2 diabetes mellitus without complications: Secondary | ICD-10-CM | POA: Diagnosis not present

## 2018-01-05 LAB — HEMOGLOBIN A1C
ESTIMATED AVERAGE GLUCOSE: 146 mg/dL
Hgb A1c MFr Bld: 6.7 % — ABNORMAL HIGH (ref 4.8–5.6)

## 2018-01-05 NOTE — Progress Notes (Signed)
Erik Aguirre 77 y.o.   Chief Complaint  Patient presents with  . Rib Fracture    follow up on the LEFT side    HISTORY OF PRESENT ILLNESS: This is a 77 y.o. male here today for follow-up of rib fractures on the left side.  Injury sustained during the fall on 11/07/2017.  Doing well.  No pain.  Feels he is completely healed.  Has a history of diabetes and wants to have his blood checked.  Due for hemoglobin A1c.  HPI   Prior to Admission medications   Medication Sig Start Date End Date Taking? Authorizing Provider  alfuzosin (UROXATRAL) 10 MG 24 hr tablet Take 1 tablet (10 mg total) by mouth daily with breakfast. **Needs office visit while on med for any refills** 11/10/17 02/08/18 Yes Jayvion Stefanski, Eilleen Kempf, MD  metFORMIN (GLUCOPHAGE) 1000 MG tablet TAKE 1 TABLET BY MOUTH TWICE DAILY WITH A MEAL 01/04/18  Yes Tamber Burtch, Eilleen Kempf, MD  Multiple Vitamin (MULTIVITAMIN) capsule Take 1 capsule by mouth daily. Reported on 01/15/2016   Yes [provider]  traMADol (ULTRAM) 50 MG tablet Take 1 tablet (50 mg total) by mouth every 8 (eight) hours as needed. Patient not taking: Reported on 01/05/2018 11/24/17   Georgina Quint, MD    Allergies  Allergen Reactions  . Lisinopril Cough  . 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 Accidental fall 11/10/2017  . Rib pain on left side 11/10/2017  . Injury of chest wall 11/10/2017  . Multiple closed fractures of ribs of left side 11/10/2017  . Dyslipidemia 11/01/2014  . HTN (hypertension) 07/02/2014  . Diabetes mellitus with no complication (HCC) 07/02/2014  . 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 Benign prostatic hyperplasia (BPH) with urinary urgency 03/17/2013    Past Medical History:  Diagnosis Date  . Acute appendicitis s/p alp appy 11/27/2014 11/26/2014  . BPH (benign prostatic hyperplasia)   . Hyperlipidemia   . Hypertension    . 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 LAPAROSCOPIC APPENDECTOMY N/A 11/27/2014   Procedure: APPENDECTOMY LAPAROSCOPIC ;  Surgeon: Karie Soda, MD;  Location: [facility];  Service: General;  Laterality: N/A;    Social History   Socioeconomic History  . Marital status: Married    Spouse name: Not on file  . Number of children: 5  . Years of education: Not on file  . Highest education level: Not on file  Occupational History  . Occupation: Retired  Engineer, production  . Financial resource strain: Not on file  . Food insecurity:    Worry: Not on file    Inability: Not on file  . Transportation needs:    Medical: Not on file    Non-medical: Not on file  Tobacco Use  . Smoking status: Never Smoker  . Smokeless tobacco: Never Used  Substance and Sexual Activity  . Alcohol use: Yes    Comment: occ  . Drug use: No  . Sexual activity: Not on file  Lifestyle  . Physical activity:    Days per week: Not on file    Minutes per session: Not on file  . Stress: Not on file  Relationships  . Social connections:    Talks on phone: Not on file    Gets together: Not on file    Attends religious service: Not on file  Active member of club or organization: Not on file    Attends meetings of clubs or organizations: Not on file    Relationship status: Not on file  . Intimate partner violence:    Fear of current or ex partner: Not on file    Emotionally abused: Not on file    Physically abused: Not on file    Forced sexual activity: Not on file  Other Topics Concern  . Not on file  Social History Narrative   Originally from [place].   Language of English okay    Family History  Problem Relation Age of Onset  . Throat cancer Brother   . Asthma Son      Review of Systems  Constitutional: Negative.  Negative for chills and fever.  Respiratory: Negative for cough and shortness of breath.   Cardiovascular:  Negative for chest pain and palpitations.  Gastrointestinal: Negative for abdominal pain, nausea and vomiting.  Genitourinary: Negative.   Skin: Negative.  Negative for rash.  Neurological: Negative.  Negative for dizziness and headaches.  Endo/Heme/Allergies: Negative.   All other systems reviewed and are negative.  Vitals:   01/05/18 0935  BP: 130/76  Pulse: 63  Resp: 16  Temp: 97.6 F (36.4 C)  SpO2: 96%     Physical Exam  Constitutional: He is oriented to person, place, and time. He appears well-developed and well-nourished.  HENT:  Head: Normocephalic and atraumatic.  Eyes: Pupils are equal, round, and reactive to light.  Neck: Normal range of motion.  Cardiovascular: Normal rate, regular rhythm and normal heart sounds.  Pulmonary/Chest: Effort normal and breath sounds normal. No respiratory distress. He exhibits no tenderness.  Abdominal: Soft. He exhibits no distension. There is no tenderness.  Musculoskeletal: Normal range of motion.  Neurological: He is alert and oriented to person, place, and time.  Skin: Skin is warm and dry. Capillary refill takes less than 2 seconds.  Psychiatric: He has a normal mood and affect. His behavior is normal.  Vitals reviewed.    ASSESSMENT & PLAN: Darrin was seen today for rib fracture.  Diagnoses and all orders for this visit:  Closed fracture of multiple ribs of left side with routine healing, subsequent encounter Comments: healed  Diabetes mellitus with no complication (HCC) -     Microalbumin, urine -     Hemoglobin A1c    Patient Instructions       IF you received an x-ray today, you will receive an invoice from [facility]. Please contact [facility] at [phone] with questions or concerns regarding your invoice.   IF you received labwork today, you will receive an invoice from [facility]. Please contact [facility] at [phone] with questions or concerns regarding your invoice.   Our billing  staff will not be able to assist you with questions regarding bills from these companies.  You will be contacted with the lab results as soon as they are available. The fastest way to get your results is to activate your My Chart account. Instructions are located on the last page of this paperwork. If you have not heard from [place] regarding the results in 2 weeks, please contact this office.     Health Maintenance, Male A healthy lifestyle and preventive care is important for your health and wellness. Ask your health care provider about what schedule of regular examinations is right for you. What should I know about weight and diet? Eat a Healthy Diet  Eat plenty of vegetables,  fruits, whole grains, low-fat dairy products, and lean protein.  Do not eat a lot of foods high in solid fats, added sugars, or salt.  Maintain a Healthy Weight Regular exercise can help you achieve or maintain a healthy weight. You should:  Do at least 150 minutes of exercise each week. The exercise should increase your heart rate and make you sweat (moderate-intensity exercise).  Do strength-training exercises at least twice a week.  Watch Your Levels of Cholesterol and Blood Lipids  Have your blood tested for lipids and cholesterol every 5 years starting at 77 years of age. If you are at high risk for heart disease, you should start having your blood tested when you are 77 years old. You may need to have your cholesterol levels checked more often if: ? Your lipid or cholesterol levels are high. ? You are older than 77 years of age. ? You are at high risk for heart disease.  What should I know about cancer screening? Many types of cancers can be detected early and may often be prevented. Lung Cancer  You should be screened every year for lung cancer if: ? You are a current smoker who has smoked for at least 30 years. ? You are a former smoker who has quit within the past 15 years.  Talk to your health care  provider about your screening options, when you should start screening, and how often you should be screened.  Colorectal Cancer  Routine colorectal cancer screening usually begins at 77 years of age and should be repeated every 5-10 years until you are 77 years old. You may need to be screened more often if early forms of precancerous polyps or small growths are found. Your health care provider may recommend screening at an earlier age if you have risk factors for colon cancer.  Your health care provider may recommend using home test kits to check for hidden blood in the stool.  A small camera at the end of a tube can be used to examine your colon (sigmoidoscopy or colonoscopy). This checks for the earliest forms of colorectal cancer.  Prostate and Testicular Cancer  Depending on your age and overall health, your health care provider may do certain tests to screen for prostate and testicular cancer.  Talk to your health care provider about any symptoms or concerns you have about testicular or prostate cancer.  Skin Cancer  Check your skin from head to toe regularly.  Tell your health care provider about any new moles or changes in moles, especially if: ? There is a change in a mole's size, shape, or color. ? You have a mole that is larger than a pencil eraser.  Always use sunscreen. Apply sunscreen liberally and repeat throughout the day.  Protect yourself by wearing long sleeves, pants, a wide-brimmed hat, and sunglasses when outside.  What should I know about heart disease, diabetes, and high blood pressure?  If you are 10-13 years of age, have your blood pressure checked every 3-5 years. If you are 63 years of age or older, have your blood pressure checked every year. You should have your blood pressure measured twice-once when you are at a hospital or clinic, and once when you are not at a hospital or clinic. Record the average of the two measurements. To check your blood pressure  when you are not at a hospital or clinic, you can use: ? An automated blood pressure machine at a pharmacy. ? A home blood pressure monitor.  Talk to your health care provider about your target blood pressure.  If you are between 52-64 years old, ask your health care provider if you should take aspirin to prevent heart disease.  Have regular diabetes screenings by checking your fasting blood sugar level. ? If you are at a normal weight and have a low risk for diabetes, have this test once every three years after the age of 20. ? If you are overweight and have a high risk for diabetes, consider being tested at a younger age or more often.  A one-time screening for abdominal aortic aneurysm (AAA) by ultrasound is recommended for men aged 65-75 years who are current or former smokers. What should I know about preventing infection? Hepatitis B If you have a higher risk for hepatitis B, you should be screened for this virus. Talk with your health care provider to find out if you are at risk for hepatitis B infection. Hepatitis C Blood testing is recommended for:  Everyone born from [birth year] through 1965.  Anyone with known risk factors for hepatitis C.  Sexually Transmitted Diseases (STDs)  You should be screened each year for STDs including gonorrhea and chlamydia if: ? You are sexually active and are younger than 78 years of age. ? You are older than 76 years of age and your health care provider tells you that you are at risk for this type of infection. ? Your sexual activity has changed since you were last screened and you are at an increased risk for chlamydia or gonorrhea. Ask your health care provider if you are at risk.  Talk with your health care provider about whether you are at high risk of being infected with HIV. Your health care provider may recommend a prescription medicine to help prevent HIV infection.  What else can I do?  Schedule regular health, dental, and eye  exams.  Stay current with your vaccines (immunizations).  Do not use any tobacco products, such as cigarettes, chewing tobacco, and e-cigarettes. If you need help quitting, ask your health care provider.  Limit alcohol intake to no more than 2 drinks per day. One drink equals 12 ounces of beer, 5 ounces of wine, or 1 ounces of hard liquor.  Do not use street drugs.  Do not share needles.  Ask your health care provider for help if you need support or information about quitting drugs.  Tell your health care provider if you often feel depressed.  Tell your health care provider if you have ever been abused or do not feel safe at home. This information is not intended to replace advice given to you by your health care provider. Make sure you discuss any questions you have with your health care provider. Document Released: 02/20/2008 Document Revised: 04/22/2016 Document Reviewed: 05/28/2015 Elsevier Interactive Patient Education  2018 Elsevier Inc.      Edwina Barth, MD [facility]

## 2018-01-05 NOTE — Patient Instructions (Addendum)
   IF you received an x-ray today, you will receive an invoice from [facility]. Please contact [facility] at [phone] with questions or concerns regarding your invoice.   IF you received labwork today, you will receive an invoice from [facility]. Please contact [facility] at [phone] with questions or concerns regarding your invoice.   Our billing staff will not be able to assist you with questions regarding bills from these companies.  You will be contacted with the lab results as soon as they are available. The fastest way to get your results is to activate your My Chart account. Instructions are located on the last page of this paperwork. If you have not heard from us regarding the results in 2 weeks, please contact this office.      Health Maintenance, Male A healthy lifestyle and preventive care is important for your health and wellness. Ask your health care provider about what schedule of regular examinations is right for you. What should I know about weight and diet? Eat a Healthy Diet  Eat plenty of vegetables, fruits, whole grains, low-fat dairy products, and lean protein.  Do not eat a lot of foods high in solid fats, added sugars, or salt.  Maintain a Healthy Weight Regular exercise can help you achieve or maintain a healthy weight. You should:  Do at least 150 minutes of exercise each week. The exercise should increase your heart rate and make you sweat (moderate-intensity exercise).  Do strength-training exercises at least twice a week.  Watch Your Levels of Cholesterol and Blood Lipids  Have your blood tested for lipids and cholesterol every 5 years starting at 77 years of age. If you are at high risk for heart disease, you should start having your blood tested when you are 77 years old. You may need to have your cholesterol levels checked more often if: ? Your lipid or cholesterol levels are high. ? You are older than 77 years of age. ? You  are at high risk for heart disease.  What should I know about cancer screening? Many types of cancers can be detected early and may often be prevented. Lung Cancer  You should be screened every year for lung cancer if: ? You are a current smoker who has smoked for at least 30 years. ? You are a former smoker who has quit within the past 15 years.  Talk to your health care provider about your screening options, when you should start screening, and how often you should be screened.  Colorectal Cancer  Routine colorectal cancer screening usually begins at 77 years of age and should be repeated every 5-10 years until you are 77 years old. You may need to be screened more often if early forms of precancerous polyps or small growths are found. Your health care provider may recommend screening at an earlier age if you have risk factors for colon cancer.  Your health care provider may recommend using home test kits to check for hidden blood in the stool.  A small camera at the end of a tube can be used to examine your colon (sigmoidoscopy or colonoscopy). This checks for the earliest forms of colorectal cancer.  Prostate and Testicular Cancer  Depending on your age and overall health, your health care provider may do certain tests to screen for prostate and testicular cancer.  Talk to your health care provider about any symptoms or concerns you have about testicular or prostate cancer.  Skin Cancer  Check your skin   from head to toe regularly.  Tell your health care provider about any new moles or changes in moles, especially if: ? There is a change in a mole's size, shape, or color. ? You have a mole that is larger than a pencil eraser.  Always use sunscreen. Apply sunscreen liberally and repeat throughout the day.  Protect yourself by wearing long sleeves, pants, a wide-brimmed hat, and sunglasses when outside.  What should I know about heart disease, diabetes, and high blood  pressure?  If you are 18-39 years of age, have your blood pressure checked every 3-5 years. If you are 40 years of age or older, have your blood pressure checked every year. You should have your blood pressure measured twice-once when you are at a hospital or clinic, and once when you are not at a hospital or clinic. Record the average of the two measurements. To check your blood pressure when you are not at a hospital or clinic, you can use: ? An automated blood pressure machine at a pharmacy. ? A home blood pressure monitor.  Talk to your health care provider about your target blood pressure.  If you are between 45-79 years old, ask your health care provider if you should take aspirin to prevent heart disease.  Have regular diabetes screenings by checking your fasting blood sugar level. ? If you are at a normal weight and have a low risk for diabetes, have this test once every three years after the age of 45. ? If you are overweight and have a high risk for diabetes, consider being tested at a younger age or more often.  A one-time screening for abdominal aortic aneurysm (AAA) by ultrasound is recommended for men aged 65-75 years who are current or former smokers. What should I know about preventing infection? Hepatitis B If you have a higher risk for hepatitis B, you should be screened for this virus. Talk with your health care provider to find out if you are at risk for hepatitis B infection. Hepatitis C Blood testing is recommended for:  Everyone born from 1945 through 1965.  Anyone with known risk factors for hepatitis C.  Sexually Transmitted Diseases (STDs)  You should be screened each year for STDs including gonorrhea and chlamydia if: ? You are sexually active and are younger than 77 years of age. ? You are older than 77 years of age and your health care provider tells you that you are at risk for this type of infection. ? Your sexual activity has changed since you were last  screened and you are at an increased risk for chlamydia or gonorrhea. Ask your health care provider if you are at risk.  Talk with your health care provider about whether you are at high risk of being infected with HIV. Your health care provider may recommend a prescription medicine to help prevent HIV infection.  What else can I do?  Schedule regular health, dental, and eye exams.  Stay current with your vaccines (immunizations).  Do not use any tobacco products, such as cigarettes, chewing tobacco, and e-cigarettes. If you need help quitting, ask your health care provider.  Limit alcohol intake to no more than 2 drinks per day. One drink equals 12 ounces of beer, 5 ounces of wine, or 1 ounces of hard liquor.  Do not use street drugs.  Do not share needles.  Ask your health care provider for help if you need support or information about quitting drugs.  Tell your health care   provider if you often feel depressed.  Tell your health care provider if you have ever been abused or do not feel safe at home. This information is not intended to replace advice given to you by your health care provider. Make sure you discuss any questions you have with your health care provider. Document Released: 02/20/2008 Document Revised: 04/22/2016 Document Reviewed: 05/28/2015 Elsevier Interactive Patient Education  2018 Elsevier Inc.  

## 2018-01-06 LAB — MICROALBUMIN, URINE: MICROALBUM., U, RANDOM: 6.7 ug/mL

## 2018-11-18 ENCOUNTER — Other Ambulatory Visit: Payer: Self-pay | Admitting: Emergency Medicine

## 2018-11-18 DIAGNOSIS — I10 Essential (primary) hypertension: Secondary | ICD-10-CM

## 2018-11-18 DIAGNOSIS — N401 Enlarged prostate with lower urinary tract symptoms: Secondary | ICD-10-CM

## 2018-11-18 DIAGNOSIS — R3915 Urgency of urination: Principal | ICD-10-CM

## 2018-11-18 NOTE — Telephone Encounter (Signed)
Requested medication (s) are due for refill today: no  Requested medication (s) are on the active medication list: no  Last refill:  Last refilled on 10/19/18  Future visit scheduled: no  Notes to clinic:  Medication not on pt's current med list    Requested Prescriptions  Pending Prescriptions Disp Refills   alfuzosin (UROXATRAL) 10 MG 24 hr tablet [Pharmacy Med Name: Alfuzosin HCl ER 10 MG Oral Tablet Extended Release 24 Hour] 30 tablet 0    Sig: TAKE 1 TABLET BY MOUTH ONCE DAILY WITH BREAKFAST **NEEDS  OFFICE  VISIT  WHILE  ON  MEDICATION  FOR  ANY  REFILLS**     Urology: Alpha-Adrenergic Blocker Passed - 11/18/2018  3:42 PM      Passed - Last BP in normal range    BP Readings from Last 1 Encounters:  01/05/18 130/76         Passed - Valid encounter within last 12 months    Recent Outpatient Visits          10 months ago Closed fracture of multiple ribs of left side with routine healing, subsequent encounter   [facility], [place], MD   11 months ago Closed fracture of multiple ribs of left side with routine healing, subsequent encounter   [facility] Care at [facility], [place], MD   11 months ago Closed fracture of multiple ribs of left side with routine healing, subsequent encounter   Primary Care at [facility], [place], MD   1 year ago Closed fracture of multiple ribs of left side with routine healing, subsequent encounter   [facility] Care at [facility], Eilleen Kempf, MD   1 year ago Closed fracture of multiple ribs of left side, initial encounter   Primary Care at [facility], Eilleen Kempf, MD

## 2018-11-30 ENCOUNTER — Other Ambulatory Visit: Payer: Self-pay

## 2018-11-30 ENCOUNTER — Telehealth (INDEPENDENT_AMBULATORY_CARE_PROVIDER_SITE_OTHER): Payer: Medicare HMO | Admitting: Emergency Medicine

## 2018-11-30 DIAGNOSIS — R3915 Urgency of urination: Secondary | ICD-10-CM

## 2018-11-30 DIAGNOSIS — N401 Enlarged prostate with lower urinary tract symptoms: Secondary | ICD-10-CM

## 2018-11-30 DIAGNOSIS — I10 Essential (primary) hypertension: Secondary | ICD-10-CM

## 2018-11-30 DIAGNOSIS — R399 Unspecified symptoms and signs involving the genitourinary system: Secondary | ICD-10-CM

## 2018-11-30 DIAGNOSIS — E119 Type 2 diabetes mellitus without complications: Secondary | ICD-10-CM

## 2018-11-30 MED ORDER — ALFUZOSIN HCL ER 10 MG PO TB24: ORAL_TABLET | ORAL | 1 refills | Status: DC

## 2018-11-30 NOTE — Progress Notes (Signed)
Lab Results  Component Value Date   HGBA1C 6.7 (H) 01/05/2018   Lab Results  Component Value Date   CHOL 182 12/07/2016   HDL 45 12/07/2016   LDLCALC 106 (H) 12/07/2016   TRIG 153 (H) 12/07/2016   CHOLHDL 4.0 12/07/2016   BP Readings from Last 3 Encounters:  01/05/18 130/76  12/08/17 (!) 142/79  11/24/17 138/69      Telemedicine Encounter- SOAP NOTE Established Patient  This telephone encounter was conducted with the patient's (or proxy's) verbal consent via audio telecommunications: yes/no: Yes Patient was instructed to have this encounter in a suitably private space; and to only have persons present to whom they give permission to participate. In addition, patient identity was confirmed by use of name plus two identifiers (DOB and address).  I discussed the limitations, risks, security and privacy concerns of performing an evaluation and management service by telephone and the availability of in person appointments. I also discussed with the patient that there may be a patient responsible charge related to this service. The patient expressed understanding and agreed to proceed.  I spent a total of TIME; 0 MIN TO 60 MIN: 20 minutes talking with the patient or their proxy.  No chief complaint on file. Urinary frequency and urgency for years.  Subjective   Erik Aguirre is a 78 y.o. male established patient. Telephone visit today for follow-up on chronic medical problems: #1LUTS with BPH.  Presently on alfuzosin 10 mg once a day.  Has had elevated PSA in the past but no urology evaluation yet. #2 history of diabetes on metformin.  Glucose at home between 120 and 130. #3 history of hypertension.  Blood pressures at home systolics between 130 and 155 and diastolics in the low 90 range. Continues to have lower urinary tract symptoms with urgency, frequency, weak urinary stream. No other complaints or medical concerns today.  HPI   Patient Active Problem List   Diagnosis Date Noted   . Dyslipidemia 11/01/2014  . HTN (hypertension) 07/02/2014  . Diabetes mellitus with no complication (HCC) 07/02/2014  . 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 Benign prostatic hyperplasia (BPH) with urinary urgency 03/17/2013    Past Medical History:  Diagnosis Date  . Acute appendicitis s/p alp appy 11/27/2014 11/26/2014  . BPH (benign prostatic hyperplasia)   . Hyperlipidemia   . Hypertension   . 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 alfuzosin (UROXATRAL) 10 MG 24 hr tablet TAKE 1 TABLET BY MOUTH ONCE DAILY WITH BREAKFAST **NEEDS  OFFICE  VISIT  WHILE  ON  MEDICATION  FOR  ANY  REFILLS** 30 tablet 0  . metFORMIN (GLUCOPHAGE) 1000 MG tablet TAKE 1 TABLET BY MOUTH TWICE DAILY WITH A MEAL 180 tablet 3  . Multiple Vitamin (MULTIVITAMIN) capsule Take 1 capsule by mouth daily. Reported on 01/15/2016    . traMADol (ULTRAM) 50 MG tablet Take 1 tablet (50 mg total) by mouth every 8 (eight) hours as needed. (Patient not taking: Reported on 01/05/2018) 20 tablet 0   No current facility-administered medications for this visit.     Allergies  Allergen Reactions  . Lisinopril Cough  . Terazosin Hcl Other (See Comments)    Severe orthostatic hypotension resulting in falls    Social History   Socioeconomic History  . Marital status: Married    Spouse name: Not on file  . Number of children: 5  . Years of education: Not on file  .  Highest education level: Not on file  Occupational History  . Occupation: Retired  Engineer, production  . Financial resource strain: Not on file  . Food insecurity:    Worry: Not on file    Inability: Not on file  . Transportation needs:    Medical: Not on file    Non-medical: Not on file  Tobacco Use  . Smoking status: Never Smoker  . Smokeless tobacco: Never Used  Substance and Sexual Activity  . Alcohol use: Yes    Comment: occ  . Drug use: No  . Sexual activity: Not on file  Lifestyle   . Physical activity:    Days per week: Not on file    Minutes per session: Not on file  . Stress: Not on file  Relationships  . Social connections:    Talks on phone: Not on file    Gets together: Not on file    Attends religious service: Not on file    Active member of club or organization: Not on file    Attends meetings of clubs or organizations: Not on file    Relationship status: Not on file  . Intimate partner violence:    Fear of current or ex partner: Not on file    Emotionally abused: Not on file    Physically abused: Not on file    Forced sexual activity: Not on file  Other Topics Concern  . Not on file  Social History Narrative   Originally from [place] [employer] [place].   Language of English okay    Review of Systems  Constitutional: Negative for chills and fever.  HENT: Negative for sore throat.   Eyes: Negative for redness.  Respiratory: Negative for cough and shortness of breath.   Cardiovascular: Negative for chest pain and palpitations.  Gastrointestinal: Negative for abdominal pain, diarrhea, nausea and vomiting.  Genitourinary: Positive for frequency and urgency. Negative for dysuria, flank pain and hematuria.  Skin: Negative.  Negative for rash.  Neurological: Negative.  Negative for dizziness and headaches.  Endo/Heme/Allergies: Negative.   All other systems reviewed and are negative.   Objective   Vitals as reported by the patient: As above stated There were no vitals filed for this visit.  There are no diagnoses linked to this encounter. Diagnoses and all orders for this visit:  Benign prostatic hyperplasia (BPH) with urinary urgency -     Ambulatory referral to Urology -     alfuzosin (UROXATRAL) 10 MG 24 hr tablet; TAKE 1 TABLET BY MOUTH ONCE DAILY WITH BREAKFAST  Diabetes mellitus with no complication (HCC)  Essential hypertension -     alfuzosin (UROXATRAL) 10 MG 24 hr tablet; TAKE 1 TABLET BY MOUTH ONCE DAILY WITH BREAKFAST   Lower urinary tract symptoms (LUTS)   Follow-up in 3 months.  I discussed the assessment and treatment plan with the patient. The patient was provided an opportunity to ask questions and all were answered. The patient agreed with the plan and demonstrated an understanding of the instructions.   The patient was advised to call back or seek an in-person evaluation if the symptoms worsen or if the condition fails to improve as anticipated.  I provided 20 minutes of non-face-to-face time during this encounter.  Georgina Quint, MD  Primary Care at [facility]

## 2019-02-14 NOTE — Telephone Encounter (Signed)
Already has appt with Dr. Mitchel Honour for 02/27/2019

## 2019-02-27 ENCOUNTER — Encounter: Payer: Self-pay | Admitting: Emergency Medicine

## 2019-02-27 ENCOUNTER — Telehealth (INDEPENDENT_AMBULATORY_CARE_PROVIDER_SITE_OTHER): Payer: Medicare HMO | Admitting: Emergency Medicine

## 2019-02-27 DIAGNOSIS — N401 Enlarged prostate with lower urinary tract symptoms: Secondary | ICD-10-CM | POA: Diagnosis not present

## 2019-02-27 DIAGNOSIS — I10 Essential (primary) hypertension: Secondary | ICD-10-CM

## 2019-02-27 DIAGNOSIS — R3915 Urgency of urination: Secondary | ICD-10-CM | POA: Diagnosis not present

## 2019-02-27 DIAGNOSIS — E119 Type 2 diabetes mellitus without complications: Secondary | ICD-10-CM

## 2019-02-27 MED ORDER — DOXAZOSIN MESYLATE 2 MG PO TABS: 2.0000 mg | ORAL_TABLET | Freq: Every day | ORAL | 1 refills | Status: DC

## 2019-02-27 NOTE — Assessment & Plan Note (Signed)
Still symptomatic.  Will stop Uroxatrol and start Cardura 2 mg.  Allergy list shows terazosin with orthostatic hypotension.  Tolerated Uroxatrol well.  Will use low-dose Cardura.  Patient advised about orthostatic hypotension.  Did follow-up with urologist but had bad experience at the office.  Will give him a different referral.

## 2019-02-27 NOTE — Progress Notes (Signed)
Lab Results  Component Value Date   HGBA1C 6.7 (H) 01/05/2018      Telemedicine Encounter- SOAP NOTE Established Patient  This telephone encounter was conducted with the patient's (or proxy's) verbal consent via audio telecommunications: yes/no: Yes Patient was instructed to have this encounter in a suitably private space; and to only have persons present to whom they give permission to participate. In addition, patient identity was confirmed by use of name plus two identifiers (DOB and address).  I discussed the limitations, risks, security and privacy concerns of performing an evaluation and management service by telephone and the availability of in person appointments. I also discussed with the patient that there may be a patient responsible charge related to this service. The patient expressed understanding and agreed to proceed.  I spent a total of TIME; 0 MIN TO 60 MIN: 15 minutes talking with the patient or their proxy.  No chief complaint on file. Follow-up of BPH and urinary symptoms  Subjective   Erik Aguirre is a 78 y.o. male established patient. Telephone visit today for follow-up of diabetes and BPH.  States Uroxatral not working.  Still having LUTS.  Interpreter used for this conversation.  States his diabetes is doing well, glucose at home 120s.  No other complaint or medical concern today.  HPI   Patient Active Problem List   Diagnosis Date Noted  . Dyslipidemia 11/01/2014  . HTN (hypertension) 07/02/2014  . Diabetes mellitus with no complication ([place]) 62/83/6629  . 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 Benign prostatic hyperplasia (BPH) with urinary urgency 03/17/2013  . BPH (benign prostatic hyperplasia) 03/17/2013  . Increased frequency of urination 03/17/2013    Past Medical History:  Diagnosis Date  . Acute appendicitis s/p alp appy 11/27/2014 11/26/2014  . BPH (benign prostatic hyperplasia)   . Hyperlipidemia   . Hypertension   . Segmental ileitis ([place])  11/03/2014    Current Outpatient Medications  Medication Sig Dispense Refill  . alfuzosin (UROXATRAL) 10 MG 24 hr tablet TAKE 1 TABLET BY MOUTH ONCE DAILY WITH BREAKFAST 30 tablet 1  . metFORMIN (GLUCOPHAGE) 1000 MG tablet TAKE 1 TABLET BY MOUTH TWICE DAILY WITH A MEAL 180 tablet 3  . Multiple Vitamin (MULTIVITAMIN) capsule Take 1 capsule by mouth daily. Reported on 01/15/2016    . traMADol (ULTRAM) 50 MG tablet Take 1 tablet (50 mg total) by mouth every 8 (eight) hours as needed. (Patient not taking: Reported on 11/30/2018) 20 tablet 0   No current facility-administered medications for this visit.     Allergies  Allergen Reactions  . Lisinopril Cough  . Terazosin Hcl Other (See Comments)    Severe orthostatic hypotension resulting in falls    Social History   Socioeconomic History  . Marital status: Married    Spouse name: Not on file  . Number of children: 5  . Years of education: Not on file  . Highest education level: Not on file  Occupational History  . Occupation: Retired  Scientific laboratory technician  . Financial resource strain: Not on file  . Food insecurity    Worry: Not on file    Inability: Not on file  . Transportation needs    Medical: Not on file    Non-medical: Not on file  Tobacco Use  . Smoking status: Never Smoker  . Smokeless tobacco: Never Used  Substance and Sexual Activity  . Alcohol use: Yes    Comment: occ  . Drug use: No  . Sexual activity: Not on file  Lifestyle  . Physical activity    Days per week: Not on file    Minutes per session: Not on file  . Stress: Not on file  Relationships  . Social Herbalist on phone: Not on file    Gets together: Not on file    Attends religious service: Not on file    Active member of club or organization: Not on file    Attends meetings of clubs or organizations: Not on file    Relationship status: Not on file  . Intimate partner violence    Fear of current or ex partner: Not on file    Emotionally  abused: Not on file    Physically abused: Not on file    Forced sexual activity: Not on file  Other Topics Concern  . Not on file  Social History Narrative   Originally from [employer] [place].   Language of English okay    Review of Systems  Constitutional: Negative.  Negative for chills and fever.  HENT: Negative for congestion and sore throat.   Respiratory: Negative.  Negative for cough and shortness of breath.   Cardiovascular: Negative for chest pain and palpitations.  Gastrointestinal: Negative for abdominal pain, diarrhea, nausea and vomiting.  Genitourinary: Positive for frequency. Negative for hematuria.       Nocturia  Musculoskeletal: Negative for myalgias.  Skin: Negative.  Negative for rash.  Neurological: Negative for dizziness and headaches.  Endo/Heme/Allergies: Negative for polydipsia.  All other systems reviewed and are negative.   Objective   Vitals as reported by the patient: There were no vitals filed for this visit. Awake and oriented x3 in no apparent respiratory distress. Diagnoses and all orders for this visit:  Benign prostatic hyperplasia (BPH) with urinary urgency -     TSH; Future -     CMP14+EGFR; Future -     POCT urinalysis dipstick; Future  Diabetes mellitus with no complication (HCC) -     Microalbumin / creatinine urine ratio; Future -     Hemoglobin A1c; Future -     TSH; Future -     CMP14+EGFR; Future  Essential hypertension -     TSH; Future -     CMP14+EGFR; Future  Benign prostatic hyperplasia with urinary frequency -     PSA; Future  BPH (benign prostatic hyperplasia) Still symptomatic.  Will stop Uroxatrol and start Cardura 2 mg.  Allergy list shows terazosin with orthostatic hypotension.  Tolerated Uroxatrol well.  Will use low-dose Cardura.  Patient advised about orthostatic hypotension.  Did follow-up with urologist but had bad experience at the office.  Will give him a different referral.     I  discussed the assessment and treatment plan with the patient. The patient was provided an opportunity to ask questions and all were answered. The patient agreed with the plan and demonstrated an understanding of the instructions.   The patient was advised to call back or seek an in-person evaluation if the symptoms worsen or if the condition fails to improve as anticipated.  I provided 15 minutes of non-face-to-face time during this encounter.  Horald Pollen, MD  Primary Care at [facility]

## 2019-02-27 NOTE — Progress Notes (Signed)
APPOINTMENT SCHEDULED

## 2019-02-27 NOTE — Progress Notes (Signed)
Patient is a 78 y.o. male with past medical history significant for DM2 and BPH who presents today for follow up. The only medical compliant today is the frequent urination. The alfuzosin is not working. He is asking to refill the tamsulin. Has nocturia, gets up 3-4 times a night to use the restroom. Medication and pharmacy verified

## 2019-03-08 ENCOUNTER — Other Ambulatory Visit: Payer: Self-pay | Admitting: Emergency Medicine

## 2019-03-08 NOTE — Telephone Encounter (Signed)
Forwarding medication refill to PCP for review. 

## 2019-05-05 ENCOUNTER — Other Ambulatory Visit: Payer: Self-pay

## 2019-05-05 ENCOUNTER — Telehealth: Payer: Self-pay | Admitting: Emergency Medicine

## 2019-05-05 DIAGNOSIS — N401 Enlarged prostate with lower urinary tract symptoms: Secondary | ICD-10-CM

## 2019-05-05 MED ORDER — DOXAZOSIN MESYLATE 2 MG PO TABS: 2.0000 mg | ORAL_TABLET | Freq: Every day | ORAL | 1 refills | Status: DC

## 2019-05-05 NOTE — Telephone Encounter (Signed)
MED REFILL doxazosin (CARDURA) 2 MG tablet  [place], [address] [id number] (Phone) [phone]

## 2019-05-05 NOTE — Telephone Encounter (Signed)
Rx sent 

## 2019-05-30 ENCOUNTER — Ambulatory Visit (INDEPENDENT_AMBULATORY_CARE_PROVIDER_SITE_OTHER): Payer: Medicare HMO | Admitting: Emergency Medicine

## 2019-05-30 ENCOUNTER — Other Ambulatory Visit: Payer: Self-pay

## 2019-05-30 ENCOUNTER — Encounter: Payer: Self-pay | Admitting: Emergency Medicine

## 2019-05-30 VITALS — BP 145/69 | HR 67 | Temp 98.3°F | Resp 16 | Ht 64.0 in | Wt 118.8 lb

## 2019-05-30 DIAGNOSIS — I10 Essential (primary) hypertension: Secondary | ICD-10-CM

## 2019-05-30 DIAGNOSIS — R3915 Urgency of urination: Secondary | ICD-10-CM

## 2019-05-30 DIAGNOSIS — R350 Frequency of micturition: Secondary | ICD-10-CM | POA: Diagnosis not present

## 2019-05-30 DIAGNOSIS — Z23 Encounter for immunization: Secondary | ICD-10-CM | POA: Diagnosis not present

## 2019-05-30 DIAGNOSIS — E119 Type 2 diabetes mellitus without complications: Secondary | ICD-10-CM | POA: Diagnosis not present

## 2019-05-30 DIAGNOSIS — N401 Enlarged prostate with lower urinary tract symptoms: Secondary | ICD-10-CM | POA: Diagnosis not present

## 2019-05-30 LAB — POCT GLYCOSYLATED HEMOGLOBIN (HGB A1C): Hemoglobin A1C: 6.1 % — AB (ref 4.0–5.6)

## 2019-05-30 LAB — GLUCOSE, POCT (MANUAL RESULT ENTRY): POC Glucose: 214 mg/dl — AB (ref 70–99)

## 2019-05-30 MED ORDER — DOXAZOSIN MESYLATE 2 MG PO TABS: 2.0000 mg | ORAL_TABLET | Freq: Every day | ORAL | 3 refills | Status: DC

## 2019-05-30 NOTE — Assessment & Plan Note (Signed)
Symptoms well controlled with Cardura.  Blood pressure within normal limits.  Continue medication, no changes.  Follow-up in 6 months.

## 2019-05-30 NOTE — Assessment & Plan Note (Signed)
Well-controlled diabetes with hemoglobin A1c at 6.1.  Continue metformin.  No changes.  Follow-up in 6 months.

## 2019-05-30 NOTE — Patient Instructions (Addendum)
If you have lab work done today you will be contacted with your lab results within the next 2 weeks.  If you have not heard from [place] then please contact us. The fastest way to get your results is to register for My Chart.   IF you received an x-ray today, you will receive an invoice from [facility]. Please contact [facility] at [phone] with questions or concerns regarding your invoice.   IF you received labwork today, you will receive an invoice from [place]. Please contact [facility] at [phone] with questions or concerns regarding your invoice.   Our billing staff will not be able to assist you with questions regarding bills from these companies.  You will be contacted with the lab results as soon as they are available. The fastest way to get your results is to activate your My Chart account. Instructions are located on the last page of this paperwork. If you have not heard from [place] regarding the results in 2 weeks, please contact this office.     T?ng s?n tuy?n ti?n li?t lnh tnh Benign Prostatic Hyperplasia  T?ng s?n tuy?n ti?n li?t lnh tnh (BPH) l tuy?n ti?n li?t ph ??i do qu trnh lo ha bnh th??ng ch? khng ph?i do ung th? gy ra. Tuy?n ti?n li?t l tuy?n c kch th??c b?ng qu? h? ?o, tham gia vo qu trnh s?n xu?t tinh d?ch. N n?m pha tr??c tr?c trng v bn d??i bng quang. Bng quang l?u tr? n??c ti?u v ni?u ??o l ?ng ??a n??c ti?u ra kh?i c? th?. Tuy?n ti?n li?t c th? l?n h?n khi ?n ng nhi?u tu?i h?n. Tuy?n ti?n li?t ph ??i c th? chn p vo ni?u ??o. ?i?u ny c th? lm cho kh ?i ti?u h?n. Tch t? n??c ti?u trong bng quang c th? gy nhi?m trng. p l?c ng??c dng v nhi?m trng c th? ti?n tri?n thnh t?n th??ng bng quang v suy th?n. Nguyn nhn g gy ra? Tnh tr?ng ny l m?t ph?n c?a qu trnh lo ha bnh th??ng. Tuy nhin, khng ph?i t?t c? nam gi?i ??u b? cc v?n ?? c?a tnh tr?ng ny. N?u tuy?n ti?n li?t ph ??i ra xa  ni?u ??o, dng n??c ti?u s? khng b? ch?n. N?u n ph ??i h??ng v? ni?u ??o v chn p ni?u ??o, s? c v?n ?? v? ti?u ti?n. ?i?u g lm t?ng nguy c?? Tnh tr?ng ny d? x?y ra h?n ? nam gi?i trn 50 tu?i. Cc d?u hi?u ho?c tri?u ch?ng g? Nh?ng tri?u ch?ng c?a tnh tr?ng ny bao g?m:  Th?c d?y th??ng xuyn trong ?m ?? ?i ti?u.  C?n ?i ti?u th??ng xuyn trong ngy.  Kh b?t ??u dng n??c ti?u.  Gi?m kch th??c v ?? m?nh c?a dng n??c ti?u.  R? (nh? gi?t) sau khi ti?u ti?n.  Khng th? ?i ti?u. Tnh tr?ng ny c?n ph?i ?i?u tr? ngay l?p t?c.  Khng th? ?i h?t n??c ti?u trong bng quang.  ?au khi ?i ti?u. Tnh tr?ng ny ph? bi?n h?n n?u c?ng km theo nhi?m trng.  Nhi?m trng ???ng ti?u (UTI). Ch?n ?on tnh tr?ng ny nh? th? no? Tnh tr?ng ny ???c ch?n ?on d?a vo khm th?c th?, khai thc b?nh s? v cc tri?u ch?ng c?a qu v?. Cc ki?m tra c?ng s? ???c th?c hi?n, ch?ng h?n nh?:  Ch?p bng quang sau khi ?i ti?u. Bi?n php ny ?o b?t c? l??ng n??c ti?u no cn l?i trong  bng quang sau khi qu v? ?i ti?u xong.  Khm tr?c trng b?ng ngn tay. Trong l?n ki?m tra tr?c trng, chuyn gia ch?m [place] s?c kh?e s? ki?m tra tuy?n ti?n li?t c?a qu v? b?ng cch cho m?t ngn tay ?eo g?ng, ? bi tr?n vo tr?c trng ?? s? m?t sau c?a tuy?n ti?n li?t. Ki?m tra ny pht hi?n kch th??c c?a tuy?n v b?t c? kh?i u ho?c s? pht tri?n b?t th??ng no.  Xt nghi?m n??c ti?u (phn tch n??c ti?u).  Sng l?c khng nguyn ??c hi?u tuy?n ti?n li?t (PSA). ?y l m?t xt nghi?m mu ???c s? d?ng ?? sng l?c ung th? tuy?n ti?n li?t.  Siu m. Ki?m tra ny s? d?ng sng m thanh ?? t?o hnh ?nh tuy?n ti?n li?t b?ng ph??ng php ?i?n t?. Chuyn gia ch?m [place] s?c kh?e c th? gi?i thi?u qu v? ??n m?t chuyn gia v? b?nh th?n v b?nh tuy?n ti?n li?t (bc s? chuyn khoa ti?t ni?u). Tnh tr?ng ny ???c ?i?u tr? nh? th? no? Khi b?t ??u c tri?u ch?ng, chuyn gia ch?m [place] s?c kh?e s? theo di tnh tr?ng c?a qu v? (theo di  tch c?c ho?c ch? tch c?c). Vi?c ?i?u tr? ti?nh tra?ng ny ty thu?c vo m??c ?? n?ng c?a b?nh. ?i?u tr? c th? bao g?m:  Theo di v khm th??ng nin. ?y c th? l cch ?i?u tr? duy nh?t c?n thi?t n?u tnh tr?ng v cc tri?u ch?ng c?a qu v? nh?.  Thu?c ?? gia?m nhe? ca?c tri?u ch??ng, bao g?m: ? Thu?c lm thu nh? tuy?n ti?n li?t. ? Thu?c ?? th? gin cc c? c?a tuy?n ti?n li?t.  Ph?u thu?t trong tr??ng h?p n?ng. Ph?u thu?t c th? bao g?m: ? C?t b? tuy?n ti?n li?t. Trong th? thu?t ny, m tuy?n ti?n li?t ???c ???c lo?i b? hon ton qua m?t v?t m? m? ho?c qua n?i soi ? b?ng ho?c robot. ? C?t tuy?n ti?n li?t qua ni?u ??o (TURP). Trong th? thu?t ny, m?t d?ng c? ???c lu?n qua l? trn ??u d??ng v?t (ni?u ??o). N ???c s? d?ng ?? c?t b? m li bn trong c?a tuy?n ti?n li?t. Cc m?nh ny ???c lo?i b? thng qua cng m?t l? ? ??u d??ng v?t. Ph??ng php ny gip lo?i b? t?c ngh?n. ? R?ch qua ni?u ??o (TUIP). Trong th? thu?t ny, cc v?t r?ch nh? ???c t?o ra trong tuy?n ti?n li?t. Th? thu?t ny lm gi?m p l?c c?a tuy?n ti?n li?t ln ni?u ??o. ? Li?u php nhi?t vi sng ni?u ??o (TUMT). Th? thu?t ny s? d?ng vi sng ?? t?o ra nhi?t. Nhi?t s? ph h?y v lo?i b? m?t l??ng nh? m tuy?n ti?n li?t. ? B?c h?i tuy?n ti?n li?t b?ng kim qua ni?u ??o (TUNA). Th? thu?t ny s? d?ng t?n s? radio ?? ph h?y v lo?i b? m?t l??ng nh? m tuy?n ti?n li?t. ? ?ng t? b?ng laser qua k? (ILC). Th? thu?t ny s? d?ng tia laser ?? ph h?y v lo?i b? m?t l??ng nh? m tuy?n ti?n li?t. ? Lm b?c h?i tuy?n ti?n li?t qua ni?u ??o (TUVP). Th? thu?t ny s? d?ng cc ?i?n c?c ?? ph h?y v lo?i b? m?t l??ng nh? m tuy?n ti?n li?t. ? Nng ni?u ??o tuy?n ti?n li?t. Th? thu?t ny ??a m?t m c?y vo ?? ??y cc thy c?a tuy?n ti?n li?t ra xa ni?u ??o. Tun th? nh?ng h??ng d?n ny ? nh:  Ch? s? d?ng thu?c khng k ??  n v thu?c k ??n theo ch? d?n c?a chuyn gia ch?m [place] s?c kh?e.  Theo di cc tri?u ch?ng xem c b?t c? thay ??i no  khng. Hy trao ??i v?i chuyn gia ch?m [place] s?c kh?e n?u c b?t k? thay ??i no.  Trnh u?ng nhi?u n??c tr??c khi ?i ng? ho?c ra ngoi n?i cng c?ng.  Trnh u?ng ho?c gi?m l??ng caffeine ho?c gi?m l??ng r??u qu v? u?ng.  T? cho qu v? th?i gian khi ti?u ti?n.  Tun th? t?t c? cc l?n khm theo di theo ch? d?n c?a chuyn gia ch?m [place] s?c kh?e. ?i?u ny c vai tr quan tr?ng. Hy lin l?c v?i chuyn gia ch?m [place] s?c kh?e n?u:  Qu v? b? ?au l?ng khng r nguyn nhn.  Cc tri?u ch?ng c?a qu v? khng ?? h?n sau khi ???c ?i?u tr?Sander Nephew v? b? nh?ng tc d?ng ph? c?a thu?c m qu v? ?ang dng.  N??c ti?u c?a qu v? tr? nn r?t s?m mu ho?c c mi hi.  Vng b?ng d??i c?a qu v? ch??ng ln v qu v? ?i ti?u kh kh?n. Yu c?u tr? gip ngay l?p t?c n?u:  Qu v? b? s?t ho?c ?n l?nh.  Qu v? ??t nhin khng th? ti?u ti?n.  Qu v? c?m th?y chong vng, ho?c r?t chng m?t, ho?c ng?t x?u.  C m?t l??ng l?n mu ho?c c?c mu ?ng trong n??c ti?u.  V?n ?? ti?u ti?n tr? ln kh qu?n l.  Qu v? b? ?au th?t l?ng ho?c ?au m?ng s??n t? m?c ?? trung bnh ??n m?c ?? n?ng. M?ng s??n l phn bn c? th? gi?a x??ng s??n v hng. Nh?ng tri?u ch?ng ny c th? l bi?u hi?n c?a m?t v?n ?? nghim tr?ng c?n c?p c?u. Khng ch? xem tri?u ch?ng c h?t khng. Hy ?i khm ngay l?p t?c. G?i cho d?ch v? c?p c?u t?i ??a ph??ng (911 ? [place] Khng t? li xe ??n b?nh vi?n. Tm t?t  T?ng s?n tuy?n ti?n li?t lnh tnh (BPH) l tuy?n ti?n li?t ph ??i do qu trnh lo ha bnh th??ng ch? khng ph?i do ung th? gy ra.  Tuy?n ti?n li?t ph ??i c th? chn p vo ni?u ??o. ?i?u ny c th? lm cho kh ?i ti?u.  Tnh tr?ng ny l m?t ph?n c?a qu trnh lo ha bnh th??ng v d? x?y ra h?n ? nam gi?i trn 50 tu?i.  Yu c?u tr? gip ngay l?p t?c n?u qu v? ??t nhin khng th? ti?u ti?n. Thng tin ny khng nh?m m?c ?ch thay th? cho l?i khuyn m chuyn gia ch?m [place] s?c kh?e ni v?i qu v?. Hy b?o ??m qu v? ph?i th?o  lu?n b?t k? v?n ?? g m qu v? c v?i chuyn gia ch?m  s?c kh?e c?a qu v?. Document Released: 08/24/2005 Document Revised: 09/05/2018 Document Reviewed: 09/05/2018 Elsevier Patient Education  2020 [employer].

## 2019-05-30 NOTE — Progress Notes (Signed)
Lab Results  Component Value Date   HGBA1C 6.7 (H) 01/05/2018   Sherald Barge 78 y.o.   Chief Complaint  Patient presents with  . Diabetes    follow up and per patient only go to the bathroom 2 times at night    HISTORY OF PRESENT ILLNESS: This is a 78 y.o. male with history of diabetes and BPH here for follow-up and medication refill. 1.  Diabetes: On metformin twice a day.  Making him lose weight. Wt Readings from Last 3 Encounters:  05/30/19 118 lb 12.8 oz (53.9 kg)  01/05/18 120 lb 3.2 oz (54.5 kg)  12/08/17 123 lb 6.4 oz (56 kg)  #2 BPH with urinary symptoms, much improved after starting Cardura 2 mg daily. No complaints or medical concerns today.  HPI   Prior to Admission medications   Medication Sig Start Date End Date Taking? Authorizing Provider  doxazosin (CARDURA) 2 MG tablet Take 1 tablet (2 mg total) by mouth daily. 05/05/19  Yes Georgina Quint, MD  metFORMIN (GLUCOPHAGE) 1000 MG tablet TAKE 1 TABLET BY MOUTH TWICE DAILY WITH A MEAL 03/09/19  Yes Donte Lenzo, Eilleen Kempf, MD  Multiple Vitamin (MULTIVITAMIN) capsule Take 1 capsule by mouth daily. Reported on 01/15/2016   Yes [provider]  traMADol (ULTRAM) 50 MG tablet Take 1 tablet (50 mg total) by mouth every 8 (eight) hours as needed. Patient not taking: Reported on 05/30/2019 11/24/17   Georgina Quint, MD    Allergies  Allergen Reactions  . Lisinopril Cough  . 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 Dyslipidemia 11/01/2014  . HTN (hypertension) 07/02/2014  . Diabetes mellitus with no complication (HCC) 07/02/2014  . 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 Benign prostatic hyperplasia (BPH) with urinary urgency 03/17/2013  . BPH (benign prostatic hyperplasia) 03/17/2013    Past Medical History:  Diagnosis Date  . Acute appendicitis s/p alp appy 11/27/2014 11/26/2014  . BPH (benign  prostatic hyperplasia)   . Hyperlipidemia   . Hypertension   . 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 LAPAROSCOPIC APPENDECTOMY N/A 11/27/2014   Procedure: APPENDECTOMY LAPAROSCOPIC ;  Surgeon: Karie Soda, MD;  Location: [facility];  Service: General;  Laterality: N/A;    Social History   Socioeconomic History  . Marital status: Married    Spouse name: Not on file  . Number of children: 5  . Years of education: Not on file  . Highest education level: Not on file  Occupational History  . Occupation: Retired  Engineer, production  . Financial resource strain: Not on file  . Food insecurity    Worry: Not on file    Inability: Not on file  . Transportation needs    Medical: Not on file    Non-medical: Not on file  Tobacco Use  . Smoking status: Never Smoker  . Smokeless tobacco: Never Used  Substance and Sexual Activity  . Alcohol use: Yes    Comment: occ  . Drug use: No  . Sexual activity: Not on file  Lifestyle  . Physical activity    Days per week: Not on file    Minutes per session: Not on file  . Stress: Not on file  Relationships  . Social Musician on phone: Not on file    Gets together: Not on  file    Attends religious service: Not on file    Active member of club or organization: Not on file    Attends meetings of clubs or organizations: Not on file    Relationship status: Not on file  . Intimate partner violence    Fear of current or ex partner: Not on file    Emotionally abused: Not on file    Physically abused: Not on file    Forced sexual activity: Not on file  Other Topics Concern  . Not on file  Social History Narrative   Originally from [place].   Language of English okay    Family History  Problem Relation Age of Onset  . Throat cancer Brother   . Asthma Son      Review of Systems  Constitutional: Negative.   HENT: Negative.  Negative for congestion and sore  throat.   Respiratory: Negative.  Negative for cough and shortness of breath.   Cardiovascular: Negative.  Negative for chest pain and palpitations.  Gastrointestinal: Negative.  Negative for abdominal pain, nausea and vomiting.  Genitourinary: Negative for dysuria and hematuria.  Musculoskeletal: Negative for myalgias and neck pain.  Skin: Negative.  Negative for rash.  Neurological: Negative for dizziness and headaches.  All other systems reviewed and are negative.  Today's Vitals   05/30/19 1330  BP: (!) 145/69  Pulse: 67  Resp: 16  Temp: 98.3 F (36.8 C)  TempSrc: Oral  SpO2: 98%  Weight: 118 lb 12.8 oz (53.9 kg)  Height: 5\' 4"  (1.626 m)   Body mass index is 20.39 kg/m.   Physical Exam Vitals signs reviewed.  Constitutional:      Appearance: Normal appearance.  HENT:     Head: Normocephalic and atraumatic.  Eyes:     Extraocular Movements: Extraocular movements intact.     Conjunctiva/sclera: Conjunctivae normal.     Pupils: Pupils are equal, round, and reactive to light.  Neck:     Musculoskeletal: Normal range of motion and neck supple.  Cardiovascular:     Rate and Rhythm: Normal rate and regular rhythm.     Pulses: Normal pulses.     Heart sounds: Normal heart sounds.  Pulmonary:     Effort: Pulmonary effort is normal.     Breath sounds: Normal breath sounds.  Abdominal:     Palpations: Abdomen is soft.     Tenderness: There is no abdominal tenderness.  Musculoskeletal: Normal range of motion.  Skin:    General: Skin is warm and dry.     Capillary Refill: Capillary refill takes less than 2 seconds.  Neurological:     General: No focal deficit present.     Mental Status: He is alert and oriented to person, place, and time.  Psychiatric:        Mood and Affect: Mood normal.        Behavior: Behavior normal.    Results for orders placed or performed in visit on 05/30/19 (from the past 24 hour(s))  POCT glucose (manual entry)     Status: Abnormal    Collection Time: 05/30/19  1:49 PM  Result Value Ref Range   POC Glucose 214 (A) 70 - 99 mg/dl  POCT glycosylated hemoglobin (Hb A1C)     Status: Abnormal   Collection Time: 05/30/19  1:54 PM  Result Value Ref Range   Hemoglobin A1C 6.1 (A) 4.0 - 5.6 %   HbA1c POC (<> result, manual entry)  HbA1c, POC (prediabetic range)     HbA1c, POC (controlled diabetic range)       ASSESSMENT & PLAN:  Diabetes mellitus with no complication (HCC) Well-controlled diabetes with hemoglobin A1c at 6.1.  Continue metformin.  No changes.  Follow-up in 6 months.  BPH (benign prostatic hyperplasia) Symptoms well controlled with Cardura.  Blood pressure within normal limits.  Continue medication, no changes.  Follow-up in 6 months.   Maron was seen today for diabetes.  Diagnoses and all orders for this visit:  Diabetes mellitus with no complication (HCC) -     POCT glucose (manual entry) -     POCT glycosylated hemoglobin (Hb A1C)  Benign prostatic hyperplasia (BPH) with urinary urgency -     doxazosin (CARDURA) 2 MG tablet; Take 1 tablet (2 mg total) by mouth daily.  Essential hypertension -     CBC with Differential/Platelet -     Comprehensive metabolic panel  Need for prophylactic vaccination and inoculation against influenza -     Flu Vaccine QUAD High Dose(Fluad)  Benign prostatic hyperplasia with urinary frequency       Patient Instructions       If you have lab work done today you will be contacted with your lab results within the next 2 weeks.  If you have not heard from [place] then please contact us. The fastest way to get your results is to register for My Chart.   IF you received an x-ray today, you will receive an invoice from [facility]. Please contact [facility] at [phone] with questions or concerns regarding your invoice.   IF you received labwork today, you will receive an invoice from [place]. Please contact [facility] at [phone] with  questions or concerns regarding your invoice.   Our billing staff will not be able to assist you with questions regarding bills from these companies.  You will be contacted with the lab results as soon as they are available. The fastest way to get your results is to activate your My Chart account. Instructions are located on the last page of this paperwork. If you have not heard from [place] regarding the results in 2 weeks, please contact this office.     T?ng s?n tuy?n ti?n li?t lnh tnh Benign Prostatic Hyperplasia  T?ng s?n tuy?n ti?n li?t lnh tnh (BPH) l tuy?n ti?n li?t ph ??i do qu trnh lo ha bnh th??ng ch? khng ph?i do ung th? gy ra. Tuy?n ti?n li?t l tuy?n c kch th??c b?ng qu? h? ?o, tham gia vo qu trnh s?n xu?t tinh d?ch. N n?m pha tr??c tr?c trng v bn d??i bng quang. Bng quang l?u tr? n??c ti?u v ni?u ??o l ?ng ??a n??c ti?u ra kh?i c? th?. Tuy?n ti?n li?t c th? l?n h?n khi ?n ng nhi?u tu?i h?n. Tuy?n ti?n li?t ph ??i c th? chn p vo ni?u ??o. ?i?u ny c th? lm cho kh ?i ti?u h?n. Tch t? n??c ti?u trong bng quang c th? gy nhi?m trng. p l?c ng??c dng v nhi?m trng c th? ti?n tri?n thnh t?n th??ng bng quang v suy th?n. Nguyn nhn g gy ra? Tnh tr?ng ny l m?t ph?n c?a qu trnh lo ha bnh th??ng. Tuy nhin, khng ph?i t?t c? nam gi?i ??u b? cc v?n ?? c?a tnh tr?ng ny. N?u tuy?n ti?n li?t ph ??i ra xa ni?u ??o, dng n??c ti?u s? khng b? ch?n. N?u n ph ??i h??ng v? ni?u ??o v chn p  ni?u ??o, s? c v?n ?? v? ti?u ti?n. ?i?u g lm t?ng nguy c?? Tnh tr?ng ny d? x?y ra h?n ? nam gi?i trn 50 tu?i. Cc d?u hi?u ho?c tri?u ch?ng g? Nh?ng tri?u ch?ng c?a tnh tr?ng ny bao g?m:  Th?c d?y th??ng xuyn trong ?m ?? ?i ti?u.  C?n ?i ti?u th??ng xuyn trong ngy.  Kh b?t ??u dng n??c ti?u.  Gi?m kch th??c v ?? m?nh c?a dng n??c ti?u.  R? (nh? gi?t) sau khi ti?u ti?n.  Khng th? ?i ti?u. Tnh tr?ng ny c?n ph?i ?i?u tr?  ngay l?p t?c.  Khng th? ?i h?t n??c ti?u trong bng quang.  ?au khi ?i ti?u. Tnh tr?ng ny ph? bi?n h?n n?u c?ng km theo nhi?m trng.  Nhi?m trng ???ng ti?u (UTI). Ch?n ?on tnh tr?ng ny nh? th? no? Tnh tr?ng ny ???c ch?n ?on d?a vo khm th?c th?, khai thc b?nh s? v cc tri?u ch?ng c?a qu v?. Cc ki?m tra c?ng s? ???c th?c hi?n, ch?ng h?n nh?:  Ch?p bng quang sau khi ?i ti?u. Bi?n php ny ?o b?t c? l??ng n??c ti?u no cn l?i trong bng quang sau khi qu v? ?i ti?u xong.  Khm tr?c trng b?ng ngn tay. Trong l?n ki?m tra tr?c trng, chuyn gia ch?m [place] s?c kh?e s? ki?m tra tuy?n ti?n li?t c?a qu v? b?ng cch cho m?t ngn tay ?eo g?ng, ? bi tr?n vo tr?c trng ?? s? m?t sau c?a tuy?n ti?n li?t. Ki?m tra ny pht hi?n kch th??c c?a tuy?n v b?t c? kh?i u ho?c s? pht tri?n b?t th??ng no.  Xt nghi?m n??c ti?u (phn tch n??c ti?u).  Sng l?c khng nguyn ??c hi?u tuy?n ti?n li?t (PSA). ?y l m?t xt nghi?m mu ???c s? d?ng ?? sng l?c ung th? tuy?n ti?n li?t.  Siu m. Ki?m tra ny s? d?ng sng m thanh ?? t?o hnh ?nh tuy?n ti?n li?t b?ng ph??ng php ?i?n t?. Chuyn gia ch?m [place] s?c kh?e c th? gi?i thi?u qu v? ??n m?t chuyn gia v? b?nh th?n v b?nh tuy?n ti?n li?t (bc s? chuyn khoa ti?t ni?u). Tnh tr?ng ny ???c ?i?u tr? nh? th? no? Khi b?t ??u c tri?u ch?ng, chuyn gia ch?m [place] s?c kh?e s? theo di tnh tr?ng c?a qu v? (theo di tch c?c ho?c ch? tch c?c). Vi?c ?i?u tr? ti?nh tra?ng ny ty thu?c vo m??c ?? n?ng c?a b?nh. ?i?u tr? c th? bao g?m:  Theo di v khm th??ng nin. ?y c th? l cch ?i?u tr? duy nh?t c?n thi?t n?u tnh tr?ng v cc tri?u ch?ng c?a qu v? nh?.  Thu?c ?? gia?m nhe? ca?c tri?u ch??ng, bao g?m: ? Thu?c lm thu nh? tuy?n ti?n li?t. ? Thu?c ?? th? gin cc c? c?a tuy?n ti?n li?t.  Ph?u thu?t trong tr??ng h?p n?ng. Ph?u thu?t c th? bao g?m: ? C?t b? tuy?n ti?n li?t. Trong th? thu?t ny, m tuy?n ti?n li?t ???c ???c lo?i b?  hon ton qua m?t v?t m? m? ho?c qua n?i soi ? b?ng ho?c robot. ? C?t tuy?n ti?n li?t qua ni?u ??o (TURP). Trong th? thu?t ny, m?t d?ng c? ???c lu?n qua l? trn ??u d??ng v?t (ni?u ??o). N ???c s? d?ng ?? c?t b? m li bn trong c?a tuy?n ti?n li?t. Cc m?nh ny ???c lo?i b? thng qua cng m?t l? ? ??u d??ng v?t. Ph??ng php ny gip lo?i b? t?c ngh?n. ? R?ch qua ni?u ??o (  TUIP). Trong th? thu?t ny, cc v?t r?ch nh? ???c t?o ra trong tuy?n ti?n li?t. Th? thu?t ny lm gi?m p l?c c?a tuy?n ti?n li?t ln ni?u ??o. ? Li?u php nhi?t vi sng ni?u ??o (TUMT). Th? thu?t ny s? d?ng vi sng ?? t?o ra nhi?t. Nhi?t s? ph h?y v lo?i b? m?t l??ng nh? m tuy?n ti?n li?t. ? B?c h?i tuy?n ti?n li?t b?ng kim qua ni?u ??o (TUNA). Th? thu?t ny s? d?ng t?n s? radio ?? ph h?y v lo?i b? m?t l??ng nh? m tuy?n ti?n li?t. ? ?ng t? b?ng laser qua k? (ILC). Th? thu?t ny s? d?ng tia laser ?? ph h?y v lo?i b? m?t l??ng nh? m tuy?n ti?n li?t. ? Lm b?c h?i tuy?n ti?n li?t qua ni?u ??o (TUVP). Th? thu?t ny s? d?ng cc ?i?n c?c ?? ph h?y v lo?i b? m?t l??ng nh? m tuy?n ti?n li?t. ? Nng ni?u ??o tuy?n ti?n li?t. Th? thu?t ny ??a m?t m c?y vo ?? ??y cc thy c?a tuy?n ti?n li?t ra xa ni?u ??o. Tun th? nh?ng h??ng d?n ny ? nh:  Ch? s? d?ng thu?c khng k ??n v thu?c k ??n theo ch? d?n c?a chuyn gia ch?m [place] s?c kh?e.  Theo di cc tri?u ch?ng xem c b?t c? thay ??i no khng. Hy trao ??i v?i chuyn gia ch?m [place] s?c kh?e n?u c b?t k? thay ??i no.  Trnh u?ng nhi?u n??c tr??c khi ?i ng? ho?c ra ngoi n?i cng c?ng.  Trnh u?ng ho?c gi?m l??ng caffeine ho?c gi?m l??ng r??u qu v? u?ng.  T? cho qu v? th?i gian khi ti?u ti?n.  Tun th? t?t c? cc l?n khm theo di theo ch? d?n c?a chuyn gia ch?m [place] s?c kh?e. ?i?u ny c vai tr quan tr?ng. Hy lin l?c v?i chuyn gia ch?m [place] s?c kh?e n?u:  Qu v? b? ?au l?ng khng r nguyn nhn.  Cc tri?u ch?ng c?a qu v? khng ?? h?n sau khi ???c ?i?u tr?[place] v? b? nh?ng tc d?ng ph? c?a thu?c m qu v? ?ang dng.  N??c ti?u c?a qu v? tr? nn r?t s?m mu ho?c c mi hi.  Vng b?ng d??i c?a qu v? ch??ng ln v qu v? ?i ti?u kh kh?n. Yu c?u tr? gip ngay l?p t?c n?u:  Qu v? b? s?t ho?c ?n l?nh.  Qu v? ??t nhin khng th? ti?u ti?n.  Qu v? c?m th?y chong vng, ho?c r?t chng m?t, ho?c ng?t x?u.  C m?t l??ng l?n mu ho?c c?c mu ?ng trong n??c ti?u.  V?n ?? ti?u ti?n tr? ln kh qu?n l.  Qu v? b? ?au th?t l?ng ho?c ?au m?ng s??n t? m?c ?? trung bnh ??n m?c ?? n?ng. M?ng s??n l phn bn c? th? gi?a x??ng s??n v hng. Nh?ng tri?u ch?ng ny c th? l bi?u hi?n c?a m?t v?n ?? nghim tr?ng c?n c?p c?u. Khng ch? xem tri?u ch?ng c h?t khng. Hy ?i khm ngay l?p t?c. G?i cho d?ch v? c?p c?u t?i ??a ph??ng (911 ? [place] Khng t? li xe ??n b?nh vi?n. Tm t?t  T?ng s?n tuy?n ti?n li?t lnh tnh (BPH) l tuy?n ti?n li?t ph ??i do qu trnh lo ha bnh th??ng ch? khng ph?i do ung th? gy ra.  Tuy?n ti?n li?t ph ??i c th? chn p vo ni?u ??o. ?i?u ny c th? lm cho kh ?i ti?u.  Tnh tr?ng ny l m?t  ph?n c?a qu trnh lo ha bnh th??ng v d? x?y ra h?n ? nam gi?i trn 50 tu?i.  Yu c?u tr? gip ngay l?p t?c n?u qu v? ??t nhin khng th? ti?u ti?n. Thng tin ny khng nh?m m?c ?ch thay th? cho l?i khuyn m chuyn gia ch?m [place] s?c kh?e ni v?i qu v?. Hy b?o ??m qu v? ph?i th?o lu?n b?t k? v?n ?? g m qu v? c v?i chuyn gia ch?m [place] s?c kh?e c?a qu v?. Document Released: 08/24/2005 Document Revised: 09/05/2018 Document Reviewed: 09/05/2018 Elsevier Patient Education  2020 Elsevier Inc.      Edwina BarthMiguel Aleph Nickson, MD [facility]

## 2019-05-31 LAB — COMPREHENSIVE METABOLIC PANEL
ALT: 13 IU/L (ref 0–44)
AST: 13 IU/L (ref 0–40)
Albumin/Globulin Ratio: 1.7 (ref 1.2–2.2)
Albumin: 3.8 g/dL (ref 3.7–4.7)
Alkaline Phosphatase: 51 IU/L (ref 39–117)
BUN/Creatinine Ratio: 20 (ref 10–24)
BUN: 21 mg/dL (ref 8–27)
Bilirubin Total: 0.4 mg/dL (ref 0.0–1.2)
CO2: 22 mmol/L (ref 20–29)
Calcium: 8.6 mg/dL (ref 8.6–10.2)
Chloride: 103 mmol/L (ref 96–106)
Creatinine, Ser: 1.07 mg/dL (ref 0.76–1.27)
GFR calc Af Amer: 76 mL/min/{1.73_m2} (ref >59)
GFR calc non Af Amer: 66 mL/min/{1.73_m2} (ref >59)
Globulin, Total: 2.3 g/dL (ref 1.5–4.5)
Glucose: 194 mg/dL — ABNORMAL HIGH (ref 65–99)
Potassium: 4.2 mmol/L (ref 3.5–5.2)
Sodium: 140 mmol/L (ref 134–144)
Total Protein: 6.1 g/dL (ref 6.0–8.5)

## 2019-05-31 LAB — CBC WITH DIFFERENTIAL/PLATELET
Basophils Absolute: 0 10*3/uL (ref 0.0–0.2)
Basos: 0 %
EOS (ABSOLUTE): 0.1 10*3/uL (ref 0.0–0.4)
Eos: 2 %
Hematocrit: 39.4 % (ref 37.5–51.0)
Hemoglobin: 12.8 g/dL — ABNORMAL LOW (ref 13.0–17.7)
Immature Grans (Abs): 0 10*3/uL (ref 0.0–0.1)
Immature Granulocytes: 0 %
Lymphocytes Absolute: 2.2 10*3/uL (ref 0.7–3.1)
Lymphs: 28 %
MCH: 30.2 pg (ref 26.6–33.0)
MCHC: 32.5 g/dL (ref 31.5–35.7)
MCV: 93 fL (ref 79–97)
Monocytes Absolute: 0.5 10*3/uL (ref 0.1–0.9)
Monocytes: 7 %
Neutrophils Absolute: 4.9 10*3/uL (ref 1.4–7.0)
Neutrophils: 63 %
Platelets: 242 10*3/uL (ref 150–450)
RBC: 4.24 x10E6/uL (ref 4.14–5.80)
RDW: 13.2 % (ref 11.6–15.4)
WBC: 7.8 10*3/uL (ref 3.4–10.8)

## 2019-05-31 NOTE — Progress Notes (Signed)
Verbalized understanding

## 2019-09-07 ENCOUNTER — Other Ambulatory Visit: Payer: Self-pay | Admitting: Emergency Medicine

## 2019-11-27 ENCOUNTER — Encounter: Payer: Self-pay | Admitting: Emergency Medicine

## 2019-11-27 ENCOUNTER — Ambulatory Visit (INDEPENDENT_AMBULATORY_CARE_PROVIDER_SITE_OTHER): Payer: Medicare HMO | Admitting: Emergency Medicine

## 2019-11-27 ENCOUNTER — Other Ambulatory Visit: Payer: Self-pay

## 2019-11-27 VITALS — BP 166/87 | HR 65 | Temp 98.6°F | Ht 66.0 in | Wt 124.2 lb

## 2019-11-27 DIAGNOSIS — Z87438 Personal history of other diseases of male genital organs: Secondary | ICD-10-CM

## 2019-11-27 DIAGNOSIS — E1159 Type 2 diabetes mellitus with other circulatory complications: Secondary | ICD-10-CM | POA: Diagnosis not present

## 2019-11-27 DIAGNOSIS — E1169 Type 2 diabetes mellitus with other specified complication: Secondary | ICD-10-CM

## 2019-11-27 DIAGNOSIS — E785 Hyperlipidemia, unspecified: Secondary | ICD-10-CM | POA: Diagnosis not present

## 2019-11-27 DIAGNOSIS — I10 Essential (primary) hypertension: Secondary | ICD-10-CM | POA: Diagnosis not present

## 2019-11-27 DIAGNOSIS — I152 Hypertension secondary to endocrine disorders: Secondary | ICD-10-CM

## 2019-11-27 MED ORDER — ROSUVASTATIN CALCIUM 10 MG PO TABS: 10.0000 mg | ORAL_TABLET | Freq: Every day | ORAL | 3 refills | Status: DC

## 2019-11-27 NOTE — Patient Instructions (Addendum)
   If you have lab work done today you will be contacted with your lab results within the next 2 weeks.  If you have not heard from us then please contact us. The fastest way to get your results is to register for My Chart.   IF you received an x-ray today, you will receive an invoice from [facility]. Please contact [facility] at [phone] with questions or concerns regarding your invoice.   IF you received labwork today, you will receive an invoice from [facility]. Please contact [facility] at [phone] with questions or concerns regarding your invoice.   Our billing staff will not be able to assist you with questions regarding bills from these companies.  You will be contacted with the lab results as soon as they are available. The fastest way to get your results is to activate your My Chart account. Instructions are located on the last page of this paperwork. If you have not heard from us regarding the results in 2 weeks, please contact this office.     B?nh ti?u ???ng v dinh d??ng, ng??i l?n Diabetes Mellitus and Nutrition, Adult Khi qu v? b? ti?u ???ng (b?nh ti?u ???ng), ?i?u r?t quan tr?ng l ph?i c thi quen ?n u?ng lnh m?nh b?i v nh?ng th? qu v? ?n v u?ng ?nh h??ng l?n ??n n?ng ?? ???ng (glucose) trong mu c?a qu v?. ?n th?c ph?m lnh m?nh v?i s? l??ng v?a ph?i, vo cng cc th?i ?i?m m?i ngy, c th? gip qu v?:  Ki?m sot ???ng huy?t.  Gi?m nguy c? b? b?nh tim.  C?i thi?n huy?t p.  ??t ???c ho?c duy tr m??c cn n?ng c l?i cho s?c kh?e. M?i ng??i b? ti?u ???ng khc nhau v m?i ng??i ??u c nhu c?u v? ch??ng trnh ?n u?ng khc nhau. Chuyn gia ch?m sc s?c kh?e c?a qu v? c th? khuyn qu v? trao ??i v?i m?t chuyn gia v? ch? ?? ?n v dinh d??ng (chuyn gia dinh d??ng) ?? xy d?ng ch??ng trnh ?n u?ng ph h?p nh?t v?i qu v?. Ch??ng trnh ?n u?ng c?a qu v? c th? thay ??i ty thu?c vo cc y?u t? nh?:  L??ng calo quy? vi? c?n.  Cc lo?i  thu?c m quy? vi? du?ng.  Cn n??ng cu?a quy? vi?.  N?ng ?? ???ng huy?t, huy?t p v cholesterol c?a qu v?.  M?c ?? ho?t ??ng c?a qu v?.  Cc tnh tr?ng s?c kh?e khc m qu v? c, ch?ng h?n nh? b?nh tim ho?c b?nh th?n. Carbohydrate ?nh h??ng nh? th? no ??n ti? Carbohydrate, hay cn g?i l carb, ?nh h??ng ??n l??ng ???ng huy?t c?a quy? vi? nhi?u h?n b?t k? lo?i th?c ph?m no khc. ?n carb theo cch t? nhin lm t?ng l??ng glucose trong mu. Ti?nh l???ng carb l m?t ph??ng php theo di l??ng carb m quy? vi? ?n. Ti?nh l???ng carb c vai tr quan tr?ng trong vi?c gi? cho ???ng huy?t cu?a quy? vi? ? m?c c l?i cho s?c kh?e, ??c bi?t l n?u quy? vi? du?ng insulin ho?c u?ng m?t s? lo?i thu?c nh?t ??nh ?i?u tri? ti?u ???ng. Bi?t l??ng carb m qu v? c th? ?n m?t cch an ton trong m?i b?a ?n c vai tr quan tr?ng. L??ng carbohydrate ny khc nhau v?i m?i ng??i. Chuyn gia dinh d??ng c?a qu v? c th? gip tnh ton l??ng carb m qu v? nn ?n vo m?i b?a ?n chnh v m?i b?a ?n nh?. Cc th?c ph?m   c ch?a carb bao g?m:  Bnh m, ng? c?c, c?m, m ?ng v bnh quy gin.  Khoai ty v ng.  ??u H Lan, ??u v ??u l?ng.  S?a v s?a chua.  Tri cy v n??c p tri cy.  ?? trng mi?ng, ch?ng h?n nh? bnh ng?t, bnh quy, kem v k?o. R??u ?nh h??ng ??n ti nh? th? no? R??u c th? gy gi?m ???ng huy?t (h? ???ng huy?t) ??t ng?t, ??c bi?t l n?u qu v? s? d?ng insulin ho?c u?ng m?t s? lo?i thu?c nh?t ??nh ?i?u tr? ti?u ???ng. H? ???ng huy?t c th? l m?t tnh tr?ng ?e d?a ma?ng s?ng. Cc tri?u ch?ng c?a h? ???ng huy?t (bu?n ng?, chng m?t v l l?n) t??ng t? nh? cc tri?u ch?ng c?a vi?c u?ng qu nhi?u r??u. N?u chuyn gia ch?m sc s?c kh?e ni r?ng r??u an ton cho qu v?, hy tun theo cc h??ng d?n sau:  Gi?i h?n l??ng r??u qu v? u?ng khng qu 1 ly m?i ngy v?i ph? n? khng mang thai v 2 ly m?i ngy v?i nam gi?i. M?t ly t??ng ???ng v?i 12 ao-x? bia, 5 ao-x? r??u vang, ho?c 1 ao-x? r??u  m?nh.  Khng u?ng r???u khi ?o?i.  Lun b ?? n??c b?ng n??c, soda dnh cho ng??i ?n [place], ho?c tr ? khng ???ng.  Lun nh? r?ng soda thng th???ng, n??c tri cy v ca?c ?? u?ng h?n h??p khc c th? ch?a nhi?u ???ng v ph?i ???c tnh l carb. C nh?ng l?i khuyn no ?? tun th? ch??ng trnh ny?  ??c nhn th?c ph?m  B?t ??u b?ng vi?c ki?m tra kh?u ph?n trn nhn "Thng tin dinh d??ng" c?a th?c ph?m v ?? u?ng ?ng gi. L??ng calo, carb, ch?t bo v cc ch?t dinh d??ng khc ghi trn nhn d?a trn m?t kh?u ph?n c?a mn ?. Nhi?u mn ch?a nhi?u h?n m?t kh?u ph?n trn m?i bao b.  Ki?m tra t?ng s? gam (g) carb trong m?t kh?u ph?n. Qu v? c th? tnh s? kh?u ph?n carb trong m?t kh?u ph?n b?ng cch chia t?ng carb cho 15. V d?: m?t th?c ph?m ch?a t?ng 30 g carb, n s? t??ng ???ng v?i 2 kh?u ph?n carb.  Ki?m tra s? gam (g) ch?t bo bo ha v chuy?n ha trong m?t kh?u ph?n. Ch?n th?c ph?m t ho?c khng c nh?ng ch?t bo ny.  Ki?m tra s? miligam (mg) mu?i (natri) trong m?t kh?u ph?n. H?u h?t m?i ng??i ??u nn gi?i h?n t?ng natri tiu thu? ?? m??c d??i 2.300 mg m?i ngy.  Lun ki?m tra thng tin dinh d??ng th?c ph?m c ???c ghi l "t bo" hay "khng bo" khng. Nh?ng th?c ph?m ny c th? c hm l??ng ???ng ph? gia ho?c carbo tinh luy?n cao h?n v nn trnh.  Hy trao ??i v?i chuyn gia dinh d??ng c?a qu v? ?? xc ??nh m?c tiu hng ngy ??i v?i cc ch?t dinh d??ng ghi trn nhn. Mua s?m  Trnh mua nh?ng th?c ph?m ?ng h?p, lm s?n, ho?c ch? bi?n s?n. Nh?ng th?c ph?m ny c xu h??ng c nhi?u ch?t bo, natri, ???ng b? sung.  Mua s?m quanh ra ngoi c?a c?a hng t?p ha. Ch? ngy t?p trung tri cy v rau c? t??i, nhi?u lo?i h?t, th?t t??i v s?a t??i. N?u n??ng  S? d?ng ca?c ph??ng php n?u ?n nhi?t ?? th?p, ch?ng h?n nh? n??ng, thay v ph??ng php n?u nhi?t ?? cao nh? chin ng?p d?u m??.    N?u ?n b?ng cch s? d?ng cc lo?i d?u t?t cho s?c kh?e, ch?ng h?n nh? d?u  liu, canola ho?c  h??ng d??ng.  Trnh n?u v?i b?, kem, ho?c th?t nhi?u m?. Ln k? ho?ch cho b?a ?n  ?n cc b?a ?n chnh v cc b?a ?n nh? ??u ??n, t?t nh?t l vo cng m?t th?i ?i?m m?i ngy. Trnh nhi?n ?n trong th?i gian di.  ?n th?c ph?m giu ch?t x? nh? tri cy v rau t??i, ??u v ng? c?c nguyn h?t. Trao ??i v?i chuyn gia dinh d??ng xem qu v? c th? ?n bao nhiu kh?u ph?n carb m?i b?a ?n.  ?n 4-6 ao-x? (oz) protein n?c m?i ngy, ch?ng h?n nh? th?t n?c, th?t g, c, tr?ng, ho?c ??u ph?. M?t oz protein n?c b?ng: ? 1 oz th?t, th?t g ho?c c. ? 1 qu? tr?ng. ?  c?c ??u ph?.  ?n m?t s? th?c ph?m m?i ngy ch?a cc ch?t bo lnh m?nh, ch?ng h?n nh? qu? b?, qu? h?ch, cc lo?i h?t v c. L?i s?ng  Ki?m tra ???ng huy?t th??ng xuyn.  T?p th? d?c th??ng xuyn theo ch? d?n c?a chuyn gia ch?m sc s?c kh?e. Vi?c ny c th? bao g?m: ? 150 phu?t t?p th? du?c v?i c???ng ?? trung bi?nh ho?c c??ng ?? m?nh m?i tu?n. Bi t?p ny c th? l ?i b? nhanh, ??p xe ho?c th? d?c nh?p ?i?u d??i n??c. ? Gin c? v th??c hi?n ca?c ba?i t?p t?ng c???ng s??c m?nh, ch??ng ha?n nh? yoga ho??c nng ta?, i?t nh?t 2 l?n m?i tu?n.  Dng thu?c theo ch? d?n c?a chuyn gia ch?m sc s?c kh?e.  Khng s? d?ng b?t k? s?n ph?m no ch?a nicotine ho?c thu?c l, ch?ng ha?n nh? thu?c l d?ng ht v thu?c l ?i?n t?. N?u qu v? c?n gip ?? ?? cai thu?c, hy h?i chuyn gia ch?m sc s?c kh?e.  Lm vi?c v?i t? v?n vin ho?c chuyn gia gio d?c ti?u ???ng ?? xc ??nh chi?n l??c qu?n l c?ng th?ng v b?t c? thch th?c no v? c?m xc v x h?i. Ca?c cu h?i c?n ??a ra v?i chuyn gia ch?m sc s?c kh?e  Ti co? c?n g??p m?t chuyn gia h???ng d?n v? b?nh ti?u ????ng khng?  Ti c c?n g?p chuyn gia dinh d??ng khng?  Ti co? th? go?i cho s? na?o n?u ti co? th?c m?c?  Khi no l th?i ?i?m ki?m tra ???ng huy?t t?t nh?t? N?i ?? tm thm thng tin:  Hi?p h?i Ti?u ????ng My?: [email or web address]  H?c vi?n Dinh d??ng v ?n u?ng:  [email or web address]  Vi?n Ti?u ???ng v cc B?nh v? Tiu ha v Th?n Qu?c Gia (NIH): www.[email or web address].gov Tm t?t  Ch??ng trnh b?a ?n lnh m?nh s? gip qu v? ki?m sot ???ng huy?t v duy tr l?i s?ng lnh m?nh.  Lm vi?c v?i m?t chuyn gia v? dinh d??ng v ch? ?? ?n (chuyn gia dinh d??ng) c th? gip qu v? l?p k? ho?ch b?a ?n ph h?p nh?t cho qu v?.  Lun nh? r?ng carbohydrate (carb) v r??u c tc ??ng ngay l?p t?c ln n?ng ?? ???ng huy?t c?a qu v?. ?i?u quan tr?ng l ph?i tnh carb v s? d?ng m?t r??u cch th?n tr?ng. Thng tin ny khng nh?m m?c ?ch thay th? cho l?i khuyn m chuyn gia ch?m sc s?c kh?e ni v?i qu v?. Hy b?o ??m qu v? ph?i th?o lu?n b?t k? v?n ?? g m qu v? c v?i   chuyn gia ch?m sc s?c kh?e c?a qu v?. Document Revised: 05/04/2017 Document Reviewed: 12/24/2016 [employer] Patient Education  2020 [employer]  

## 2019-11-27 NOTE — Progress Notes (Signed)
Erik Aguirre 79 y.o.   Chief Complaint  Patient presents with  . Medical Management of Chronic Issues    6 m f/u   . Diabetes    HISTORY OF PRESENT ILLNESS: This is a 79 y.o. male with history of hypertension, diabetes, dyslipidemia, BPH here for follow-up. Doing well has no complaints or medical concerns.  Using [employer] interpreter, Maurine Minister, [place] language. 1.  Diabetes: On Metformin 1000 mg twice a day.  Needs medication refill. Lab Results  Component Value Date   HGBA1C 6.1 (A) 05/30/2019   2.  Hypertension: On Cardura 2 mg daily for BPH which helps his blood pressure today. Blood pressure readings at home within normal limits. BP Readings from Last 3 Encounters:  11/27/19 (!) 167/93  05/30/19 (!) 145/69  01/05/18 130/76   3.  Dyslipidemia: On no medication. Lab Results  Component Value Date   CHOL 182 12/07/2016   HDL 45 12/07/2016   LDLCALC 106 (H) 12/07/2016   TRIG 153 (H) 12/07/2016   CHOLHDL 4.0 12/07/2016   Fully vaccinated against Covid.  The 10-year ASCVD risk score Denman George DC Montez Hageman., et al., 2013) is: 66.1%   Values used to calculate the score:     Age: 35 years     Sex: Male     Is Non-Hispanic African American: No     Diabetic: Yes     Tobacco smoker: No     Systolic Blood Pressure: 167 mmHg     Is BP treated: No     HDL Cholesterol: 45 mg/dL     Total Cholesterol: 182 mg/dL  HPI   Prior to Admission medications   Medication Sig Start Date End Date Taking? Authorizing Provider  doxazosin (CARDURA) 2 MG tablet Take 1 tablet (2 mg total) by mouth daily. 05/30/19 08/28/19  Georgina Quint, MD  metFORMIN (GLUCOPHAGE) 1000 MG tablet TAKE 1 TABLET BY MOUTH TWICE DAILY WITH A MEAL 09/07/19   Georgina Quint, MD  Multiple Vitamin (MULTIVITAMIN) capsule Take 1 capsule by mouth daily. Reported on 01/15/2016    [provider]  traMADol (ULTRAM) 50 MG tablet Take 1 tablet (50 mg total) by mouth every 8 (eight) hours as needed. Patient not  taking: Reported on 05/30/2019 11/24/17   Georgina Quint, MD    Allergies  Allergen Reactions  . Lisinopril Cough  . 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 Dyslipidemia 11/01/2014  . Hypertension associated with diabetes (HCC) 07/02/2014  . Diabetes mellitus with no complication (HCC) 07/02/2014  . 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 Benign prostatic hyperplasia (BPH) with urinary urgency 03/17/2013  . BPH (benign prostatic hyperplasia) 03/17/2013    Past Medical History:  Diagnosis Date  . Acute appendicitis s/p alp appy 11/27/2014 11/26/2014  . BPH (benign prostatic hyperplasia)   . Hyperlipidemia   . Hypertension   . 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 LAPAROSCOPIC APPENDECTOMY N/A 11/27/2014   Procedure: APPENDECTOMY LAPAROSCOPIC ;  Surgeon: Karie Soda, MD;  Location: [facility];  Service: General;  Laterality: N/A;    Social History   Socioeconomic History  . Marital status: Married    Spouse name: Not on file  . Number of children: 5  . Years of education: Not on file  . Highest education level: Not on file  Occupational History  . Occupation:  Retired  Tobacco Use  . Smoking status: Never Smoker  . Smokeless tobacco: Never Used  Substance and Sexual Activity  . Alcohol use: Yes    Comment: occ  . Drug use: No  . Sexual activity: Not on file  Other Topics Concern  . Not on file  Social History Narrative   Originally from [place].   Language of English okay   Social Determinants of Health   Financial Resource Strain:   . Difficulty of Paying Living Expenses:   Food Insecurity:   . Worried About Programme researcher, broadcasting/film/video in the Last Year:   . Barista in the Last Year:   Transportation Needs:   . Freight forwarder (Medical):   Marland Kitchen Lack of Transportation  (Non-Medical):   Physical Activity:   . Days of Exercise per Week:   . Minutes of Exercise per Session:   Stress:   . Feeling of Stress :   Social Connections:   . Frequency of Communication with Friends and Family:   . Frequency of Social Gatherings with Friends and Family:   . Attends Religious Services:   . Active Member of Clubs or Organizations:   . Attends Banker Meetings:   Marland Kitchen Marital Status:   Intimate Partner Violence:   . Fear of Current or Ex-Partner:   . Emotionally Abused:   Marland Kitchen Physically Abused:   . Sexually Abused:     Family History  Problem Relation Age of Onset  . Throat cancer Brother   . Asthma Son      Review of Systems  Constitutional: Negative.  Negative for chills and fever.  Respiratory: Negative.  Negative for cough and shortness of breath.   Cardiovascular: Negative.  Negative for chest pain and palpitations.  Gastrointestinal: Negative.  Negative for abdominal pain, blood in stool, diarrhea, melena, nausea and vomiting.  Genitourinary: Negative.  Negative for dysuria and hematuria.  Musculoskeletal: Negative.  Negative for myalgias and neck pain.  Skin: Negative.  Negative for rash.  Neurological: Negative.  Negative for dizziness and headaches.  All other systems reviewed and are negative.  Today's Vitals   11/27/19 1353 11/27/19 1356  BP: (!) 167/93 (!) 166/87  Pulse: 65   Temp: 98.6 F (37 C)   TempSrc: Temporal   SpO2: 97%   Weight: 124 lb 3.2 oz (56.3 kg)   Height: 5\' 6"  (1.676 m)    Body mass index is 20.05 kg/m.   Physical Exam Vitals reviewed.  Constitutional:      Appearance: Normal appearance.  HENT:     Head: Normocephalic.  Eyes:     Extraocular Movements: Extraocular movements intact.     Pupils: Pupils are equal, round, and reactive to light.  Cardiovascular:     Rate and Rhythm: Normal rate and regular rhythm.     Pulses: Normal pulses.     Heart sounds: Normal heart sounds.  Pulmonary:      Effort: Pulmonary effort is normal.     Breath sounds: Normal breath sounds.  Abdominal:     General: There is no distension.     Palpations: Abdomen is soft.     Tenderness: There is no abdominal tenderness.  Musculoskeletal:        General: Normal range of motion.     Cervical back: Normal range of motion and neck supple.  Skin:    General: Skin is warm and dry.     Capillary Refill: Capillary  refill takes less than 2 seconds.  Neurological:     General: No focal deficit present.     Mental Status: He is alert and oriented to person, place, and time.  Psychiatric:        Mood and Affect: Mood normal.        Behavior: Behavior normal.    A total of 30 minutes was spent with the patient, greater than 50% of which was in counseling/coordination of care regarding diabetes, hypertension, and dyslipidemia, cardiovascular risks associated with these conditions, review of most recent office visit notes, review of most recent blood results, review of all medications, prognosis, and need for follow-up.   ASSESSMENT & PLAN: [place] was seen today for medical management of chronic issues and diabetes.  Diagnoses and all orders for this visit:  Hypertension associated with diabetes (HCC) -     Comprehensive metabolic panel -     Hemoglobin A1c  History of BPH  Dyslipidemia associated with type 2 diabetes mellitus (HCC) -     rosuvastatin (CRESTOR) 10 MG tablet; Take 1 tablet (10 mg total) by mouth daily. -     Lipid panel    Patient Instructions       If you have lab work done today you will be contacted with your lab results within the next 2 weeks.  If you have not heard from [place] then please contact us. The fastest way to get your results is to register for My Chart.   IF you received an x-ray today, you will receive an invoice from [facility]. Please contact [facility] at [phone] with questions or concerns regarding your invoice.   IF you received  labwork today, you will receive an invoice from [facility]. Please contact [facility] at [phone] with questions or concerns regarding your invoice.   Our billing staff will not be able to assist you with questions regarding bills from these companies.  You will be contacted with the lab results as soon as they are available. The fastest way to get your results is to activate your My Chart account. Instructions are located on the last page of this paperwork. If you have not heard from [place] regarding the results in 2 weeks, please contact this office.      B?nh ti?u ???ng v dinh d??ng, ng??i l?n Diabetes Mellitus and Nutrition, Adult Khi qu v? b? ti?u ???ng (b?nh ti?u ???ng), ?i?u r?t quan tr?ng l ph?i c thi quen ?n u?ng lnh m?nh b?i v nh?ng th? qu v? ?n v u?ng ?nh h??ng l?n ??n n?ng ?? ???ng (glucose) trong mu c?a qu v?. ?n th?c ph?m lnh m?nh v?i s? l??ng v?a ph?i, vo cng cc th?i ?i?m m?i ngy, c th? gip qu v?:  Ki?m sot ???ng huy?t.  Gi?m nguy c? b? b?nh tim.  C?i thi?n huy?t p.  ??t ???c ho?c duy tr m??c cn n?ng c l?i cho s?c kh?e. M?i ng??i b? ti?u ???ng khc nhau v m?i ng??i ??u c nhu c?u v? ch??ng trnh ?n u?ng khc nhau. Chuyn gia ch?m [place] s?c kh?e c?a qu v? c th? [place] qu v? trao ??i v?i m?t chuyn gia v? ch? ?? ?n v dinh d??ng (chuyn gia dinh d??ng) ?? xy d?ng ch??ng trnh ?n u?ng ph h?p nh?t v?i qu v?. Ch??ng trnh ?n u?ng c?a qu v? c th? thay ??i ty thu?c vo cc y?u t? nh?:  L??ng calo quy? vi? c?n.  Cc lo?i thu?c m quy? vi? du?ng.  Cn n??ng  cu?a quy? vi?.  N?ng ?? ???ng huy?t, huy?t p v cholesterol c?a qu v?.  M?c ?? ho?t ??ng c?a qu v?.  Cc tnh tr?ng s?c kh?e khc m qu v? c, ch?ng h?n nh? b?nh tim ho?c b?nh th?n. Carbohydrate ?nh h??ng nh? th? no ??n ti? Carbohydrate, hay cn g?i l carb, ?nh h??ng ??n l??ng ???ng huy?t c?a quy? vi? nhi?u h?n b?t k? lo?i th?c ph?m no khc. ?n carb theo cch t? nhin lm t?ng l??ng  glucose trong mu. Ti?nh l???ng carb l m?t ph??ng php theo di l??ng carb m quy? vi? ?n. Ti?nh l???ng carb c vai tr quan tr?ng trong vi?c gi? cho ???ng huy?t cu?a quy? vi? ? m?c c l?i cho s?c kh?e, ??c bi?t l n?u quy? vi? du?ng insulin ho?c u?ng m?t s? lo?i thu?c nh?t ??nh ?i?u tri? ti?u ???ng. Bi?t l??ng carb m qu v? c th? ?n m?t cch an ton trong m?i b?a ?n c vai tr quan tr?ng. L??ng carbohydrate ny khc nhau v?i m?i ng??i. Chuyn gia dinh d??ng c?a qu v? c th? gip tnh ton l??ng carb m qu v? nn ?n vo m?i b?a ?n chnh v m?i b?a ?n nh?. Cc th?c ph?m c ch?a carb bao g?m:  Bnh m, ng? c?c, c?m, m ?ng v bnh quy gin.  Khoai ty v ng.  ??u H Lan, ??u v ??u l?ng.  S?a v s?a chua.  Tri cy v n??c p tri cy.  ?? trng mi?ng, ch?ng h?n nh? bnh ng?t, bnh quy, kem v k?o. R??u ?nh h??ng ??n ti nh? th? no? R??u c th? gy gi?m ???ng huy?t (h? ???ng huy?t) ??t ng?t, ??c bi?t l n?u qu v? s? d?ng insulin ho?c u?ng m?t s? lo?i thu?c nh?t ??nh ?i?u tr? ti?u ???ng. H? ???ng huy?t c th? l m?t tnh tr?ng ?e d?a ma?ng s?ng. Cc tri?u ch?ng c?a h? ???ng huy?t (bu?n ng?, chng m?t v l l?n) t??ng t? nh? cc tri?u ch?ng c?a vi?c u?ng qu nhi?u r??u. N?u chuyn gia ch?m [place] s?c kh?e ni r?ng r??u an ton cho qu v?, hy tun theo cc h??ng d?n sau:  Gi?i h?n l??ng r??u qu v? u?ng khng qu 1 ly m?i ngy v?i ph? n? khng mang thai v 2 ly m?i ngy v?i nam gi?i. M?t ly t??ng ???ng v?i 12 ao-x? bia, 5 ao-x? r??u vang, ho?c 1 ao-x? r??u m?nh.  Khng u?ng r???u khi ?o?i.  Lun b ?? n??c b?ng n??c, soda dnh cho ng??i ?n king, ho?c tr ? khng ???ng.  Tamera Punt nh? r?ng soda thng th???ng, n??c tri cy v ca?c ?? u?ng h?n h??p khc c th? ch?a nhi?u ???ng v ph?i ???c tnh l carb. C nh?ng l?i khuyn no ?? tun th? ch??ng trnh ny?  ??c nhn th?c ph?m  B?t ??u b?ng vi?c ki?m tra kh?u ph?n trn nhn "Thng tin dinh d??ng" c?a th?c ph?m v ?? u?ng ?ng gi. L??ng  calo, carb, ch?t bo v cc ch?t dinh d??ng khc ghi trn nhn d?a trn m?t kh?u ph?n c?a mn ?. Nhi?u mn ch?a nhi?u h?n m?t kh?u ph?n trn m?i bao b.  Ki?m tra t?ng s? gam (g) carb trong m?t kh?u ph?n. Qu v? c th? tnh s? kh?u ph?n carb trong m?t kh?u ph?n b?ng cch chia t?ng carb cho 15. V d?: m?t th?c ph?m ch?a t?ng 30 g carb, n s? t??ng ???ng v?i 2 kh?u ph?n carb.  Ki?m tra s? gam (g) ch?t bo bo ha v  chuy?n ha trong m?t kh?u ph?n. Ch?n th?c ph?m t ho?c khng c nh?ng ch?t bo ny.  Ki?m tra s? miligam (mg) mu?i (natri) trong m?t kh?u ph?n. H?u h?t m?i ng??i ??u nn gi?i h?n t?ng natri tiu thu? ?? m??c d??i 2.300 mg m?i ngy.  Lun ki?m tra thng tin dinh d??ng th?c ph?m c ???c ghi l "t bo" hay "khng bo" khng. Nh?ng th?c ph?m ny c th? c hm l??ng ???ng ph? gia ho?c carbo tinh luy?n cao h?n v nn trnh.  Hy trao ??i v?i chuyn gia dinh d??ng c?a qu v? ?? xc ??nh m?c tiu hng ngy ??i v?i cc ch?t dinh d??ng ghi trn nhn. Mua s?m  Trnh mua nh?ng th?c ph?m ?ng h?p, lm s?n, ho?c ch? bi?n s?n. Nh?ng th?c ph?m ny c xu h??ng c nhi?u ch?t bo, natri, ???ng b? sung.  Mua s?m quanh ra ngoi c?a c?a hng t?p ha. Ch? ngy t?p trung tri cy v rau c? t??i, nhi?u lo?i h?t, th?t t??i v s?a t??i. N?u n??ng  S? d?ng ca?c ph??ng php n?u ?n nhi?t ?? th?p, ch?ng h?n nh? n??ng, thay v ph??ng php n?u nhi?t ?? cao nh? chin ng?p d?u m??.  N?u ?n b?ng cch s? d?ng cc lo?i d?u t?t cho s?c kh?e, ch?ng h?n nh? d?u  liu, [place] ho?c h??ng d??ng.  Hessie Diener n?u v?i b?, kem, ho?c th?t nhi?u m?. Ln k? ho?ch cho b?a ?n  ?n cc b?a ?n chnh v cc b?a ?n nh? ??u ??n, t?t nh?t l vo cng m?t th?i ?i?m m?i ngy. Trnh nhi?n ?n trong th?i [employer].  ?n th?c ph?m giu ch?t x? nh? tri cy v rau t??i, ??u v ng? c?c nguyn h?t. Trao ??i v?i chuyn gia dinh d??ng xem qu v? c th? ?n bao nhiu kh?u ph?n carb m?i b?a ?n.  ?n 4-6 ao-x? (oz) protein n?c m?i ngy, ch?ng h?n nh?  th?t n?c, th?t g, c, tr?ng, ho?c ??u ph?. M?t oz protein n?c b?ng: ? 1 oz th?t, th?t g ho?c c. ? 1 qu? tr?ng. ?  c?c ??u ph?.  ?n m?t s? th?c ph?m m?i ngy ch?a cc ch?t bo lnh m?nh, ch?ng h?n nh? qu? b?, qu? h?ch, cc lo?i h?t v c. L?i s?ng  Ki?m tra ???ng huy?t th??ng xuyn.  T?p th? d?c th??ng xuyn theo ch? d?n c?a chuyn gia ch?m [place] s?c kh?e. Vi?c ny c th? bao g?m: ? 150 phu?t t?p th? du?c v?i c???ng ?? trung bi?nh ho?c c??ng ?? m?nh m?i tu?n. Bi t?p ny c th? l ?i b? nhanh, ??p xe ho?c th? d?c nh?p ?i?u d??i n??c. ? Gin c? v th??c hi?n ca?c ba?i t?p t?ng c???ng s??c m?nh, ch??ng ha?n nh? yoga ho??c nng ta?, i?t nh?t 2 l?n m?i tu?n.  Dng thu?c theo ch? d?n c?a chuyn gia ch?m [place] s?c kh?e.  Khng s? d?ng b?t k? s?n ph?m no ch?a nicotine ho?c thu?c l, ch?ng ha?n nh? thu?c l d?ng ht v thu?c l ?i?n t?. N?u qu v? c?n gip ?? ?? cai thu?c, hy h?i chuyn gia ch?m [place] s?c kh?e.  Lm vi?c v?i t? v?n vin ho?c chuyn gia gio d?c ti?u ???ng ?? xc ??nh chi?n l??c qu?n l c?ng th?ng v b?t c? thch th?c no v? c?m xc v x h?i. Ca?c cu h?i c?n ??a ra v?i chuyn gia ch?m [place] s?c kh?e  Ti co? c?n g??p m?t chuyn gia h???ng d?n v? b?nh ti?u ????ng khng?  Ti  c c?n g?p chuyn gia dinh d??ng khng?  Ti co? th? go?i cho s? na?o n?u ti co? th?c m?c?  Khi no l th?i ?i?m ki?m tra ???ng huy?t t?t nh?t? N?i ?? tm thm thng tin:  Hi?p h?i Ti?u ????ng My?: diabetes.org  H?c vi?n Dinh d??ng v ?n u?ng: www.eatright.org  Vi?n Ti?u ???ng v cc B?nh v? Tiu ha v Th?n Qu?c Gia (NIH): CarFlippers.tnwww.niddk.nih.gov Tm t?t  Ch??ng trnh b?a ?n lnh m?nh s? gip qu v? ki?m sot ???ng huy?t v duy tr l?i s?ng lnh m?nh.  Lm vi?c v?i m?t chuyn gia v? dinh d??ng v ch? ?? ?n (chuyn gia dinh d??ng) c th? gip qu v? l?p k? ho?ch b?a ?n ph h?p nh?t cho qu v?.  Lun nh? r?ng carbohydrate (carb) v r??u c tc ??ng ngay l?p t?c ln n?ng ?? ???ng  huy?t c?a qu v?. ?i?u quan tr?ng l ph?i tnh carb v s? d?ng m?t r??u cch th?n tr?ng. Thng tin ny khng nh?m m?c ?ch thay th? cho l?i khuyn m chuyn gia ch?m [place] s?c kh?e ni v?i qu v?. Hy b?o ??m qu v? ph?i th?o lu?n b?t k? v?n ?? g m qu v? c v?i chuyn gia ch?m [place] s?c kh?e c?a qu v?. Document Revised: 05/04/2017 Document Reviewed: 12/24/2016 Elsevier Patient Education  2020 Elsevier Inc.      Edwina BarthMiguel Markeeta Scalf, MD [facility]

## 2019-11-28 LAB — COMPREHENSIVE METABOLIC PANEL
ALT: 15 IU/L (ref 0–44)
AST: 16 IU/L (ref 0–40)
Albumin/Globulin Ratio: 1.4 (ref 1.2–2.2)
Albumin: 3.6 g/dL — ABNORMAL LOW (ref 3.7–4.7)
Alkaline Phosphatase: 58 IU/L (ref 39–117)
BUN/Creatinine Ratio: 16 (ref 10–24)
BUN: 19 mg/dL (ref 8–27)
Bilirubin Total: 0.3 mg/dL (ref 0.0–1.2)
CO2: 23 mmol/L (ref 20–29)
Calcium: 9.1 mg/dL (ref 8.6–10.2)
Chloride: 103 mmol/L (ref 96–106)
Creatinine, Ser: 1.2 mg/dL (ref 0.76–1.27)
GFR calc Af Amer: 67 mL/min/{1.73_m2} (ref >59)
GFR calc non Af Amer: 58 mL/min/{1.73_m2} — ABNORMAL LOW (ref >59)
Globulin, Total: 2.6 g/dL (ref 1.5–4.5)
Glucose: 179 mg/dL — ABNORMAL HIGH (ref 65–99)
Potassium: 4.1 mmol/L (ref 3.5–5.2)
Sodium: 142 mmol/L (ref 134–144)
Total Protein: 6.2 g/dL (ref 6.0–8.5)

## 2019-11-28 LAB — LIPID PANEL
Chol/HDL Ratio: 2.6 ratio (ref 0.0–5.0)
Cholesterol, Total: 126 mg/dL (ref 100–199)
HDL: 48 mg/dL (ref >39)
LDL Chol Calc (NIH): 54 mg/dL (ref 0–99)
Triglycerides: 139 mg/dL (ref 0–149)
VLDL Cholesterol Cal: 24 mg/dL (ref 5–40)

## 2019-11-28 LAB — HEMOGLOBIN A1C
Est. average glucose Bld gHb Est-mCnc: 143 mg/dL
Hgb A1c MFr Bld: 6.6 % — ABNORMAL HIGH (ref 4.8–5.6)

## 2019-12-06 ENCOUNTER — Other Ambulatory Visit: Payer: Self-pay | Admitting: Emergency Medicine

## 2019-12-06 NOTE — Telephone Encounter (Signed)
Requested Prescriptions  Pending Prescriptions Disp Refills  . metFORMIN (GLUCOPHAGE) 1000 MG tablet [Pharmacy Med Name: metFORMIN HCl 1000 MG Oral Tablet] 180 tablet 0    Sig: TAKE 1 TABLET BY MOUTH TWICE DAILY WITH A MEAL     Endocrinology:  Diabetes - Biguanides Passed - 12/06/2019  3:14 PM      Passed - Cr in normal range and within 360 days    Creat  Date Value Ref Range Status  01/15/2016 1.02 0.70 - 1.18 mg/dL Final   Creatinine, Ser  Date Value Ref Range Status  11/27/2019 1.20 0.76 - 1.27 mg/dL Final         Passed - HBA1C is between 0 and 7.9 and within 180 days    Hgb A1c MFr Bld  Date Value Ref Range Status  11/27/2019 6.6 (H) 4.8 - 5.6 % Final    Comment:             Prediabetes: 5.7 - 6.4          Diabetes: >6.4          Glycemic control for adults with diabetes: <7.0          Passed - eGFR in normal range and within 360 days    GFR, Est African American  Date Value Ref Range Status  01/15/2016 83 >=60 mL/min Final   GFR calc Af Amer  Date Value Ref Range Status  11/27/2019 67 >59 mL/min/1.73 Final   GFR, Est Non African American  Date Value Ref Range Status  01/15/2016 72 >=60 mL/min Final    Comment:      The estimated GFR is a calculation valid for adults (>=61 years old) that uses the CKD-EPI algorithm to adjust for age and sex. It is   not to be used for children, pregnant women, hospitalized patients,    patients on dialysis, or with rapidly changing kidney function. According to the NKDEP, eGFR >89 is normal, 60-89 shows mild impairment, 30-59 shows moderate impairment, 15-29 shows severe impairment and <15 is ESRD.      GFR calc non Af Amer  Date Value Ref Range Status  11/27/2019 58 (L) >59 mL/min/1.73 Final         Passed - Valid encounter within last 6 months    Recent Outpatient Visits          1 week ago Hypertension associated with diabetes [facility])   Primary Care at [facility], [place], [place]   6 months ago Diabetes mellitus  with no complication [facility])   Primary Care at [facility], [place], [place]   9 months ago Benign prostatic hyperplasia (BPH) with urinary urgency   Primary Care at [facility], [place], [place]   1 year ago Benign prostatic hyperplasia (BPH) with urinary urgency   Primary Care at [facility], [place], [place]   1 year ago Closed fracture of multiple ribs of left side with routine healing, subsequent encounter   Primary Care at [facility], [place], [place]      Future Appointments            In 5 months [place], [place], MD Primary Care at [place], [facility]

## 2019-12-20 IMAGING — DX DG RIBS W/ CHEST 3+V*L*
4 series · 4 of 4 positions shown · non-contrast
Comparison: 11/10/2017

CLINICAL DATA: Rib fractures 1 month ago

EXAM:
LEFT RIBS AND CHEST - 3+ VIEW

[chest pa]
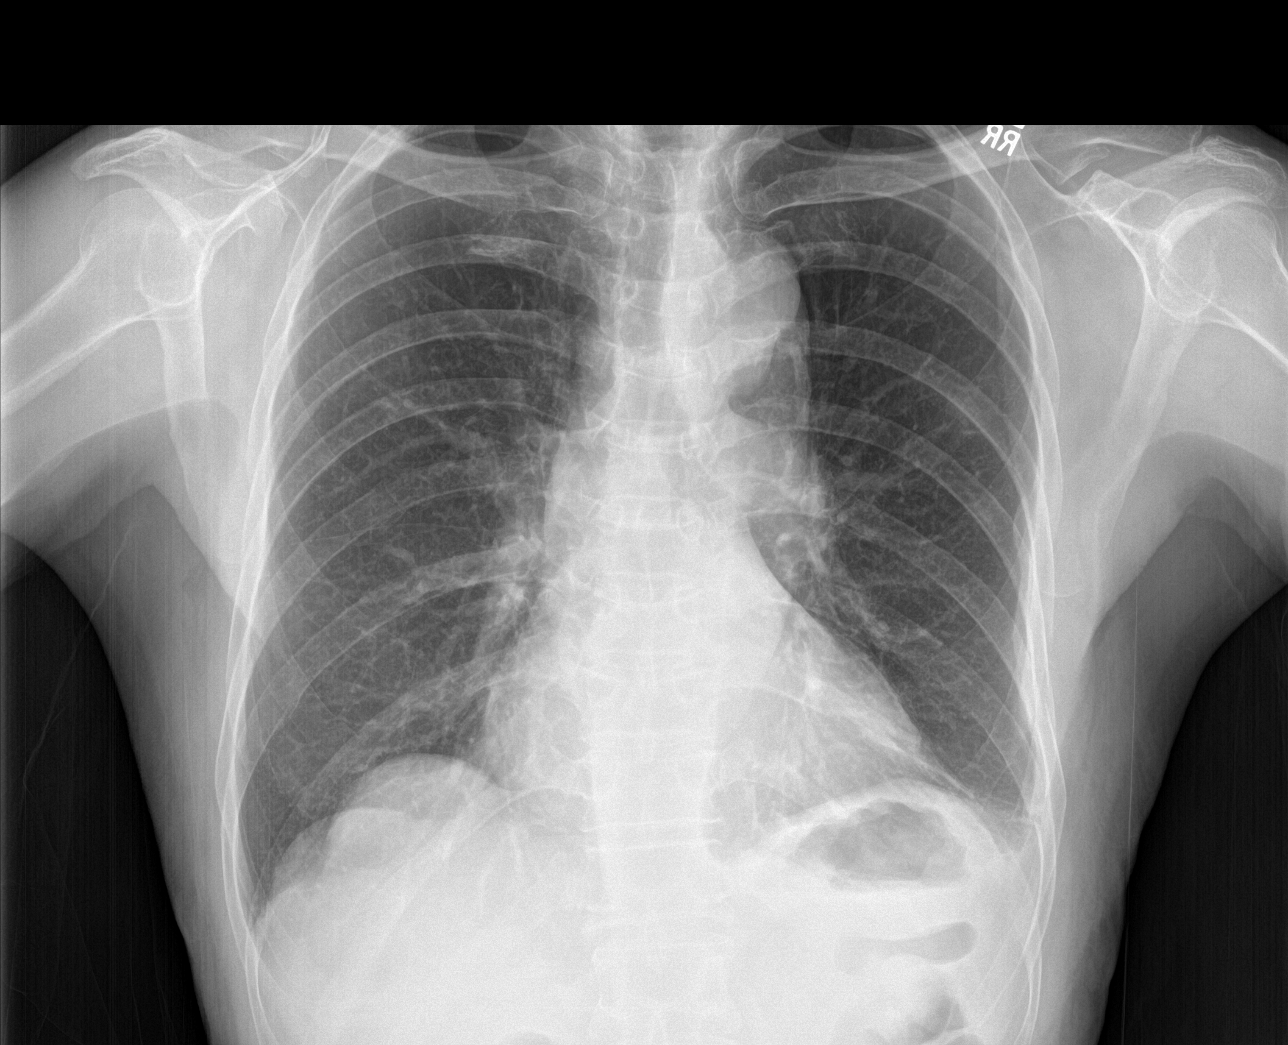

[rib pa]
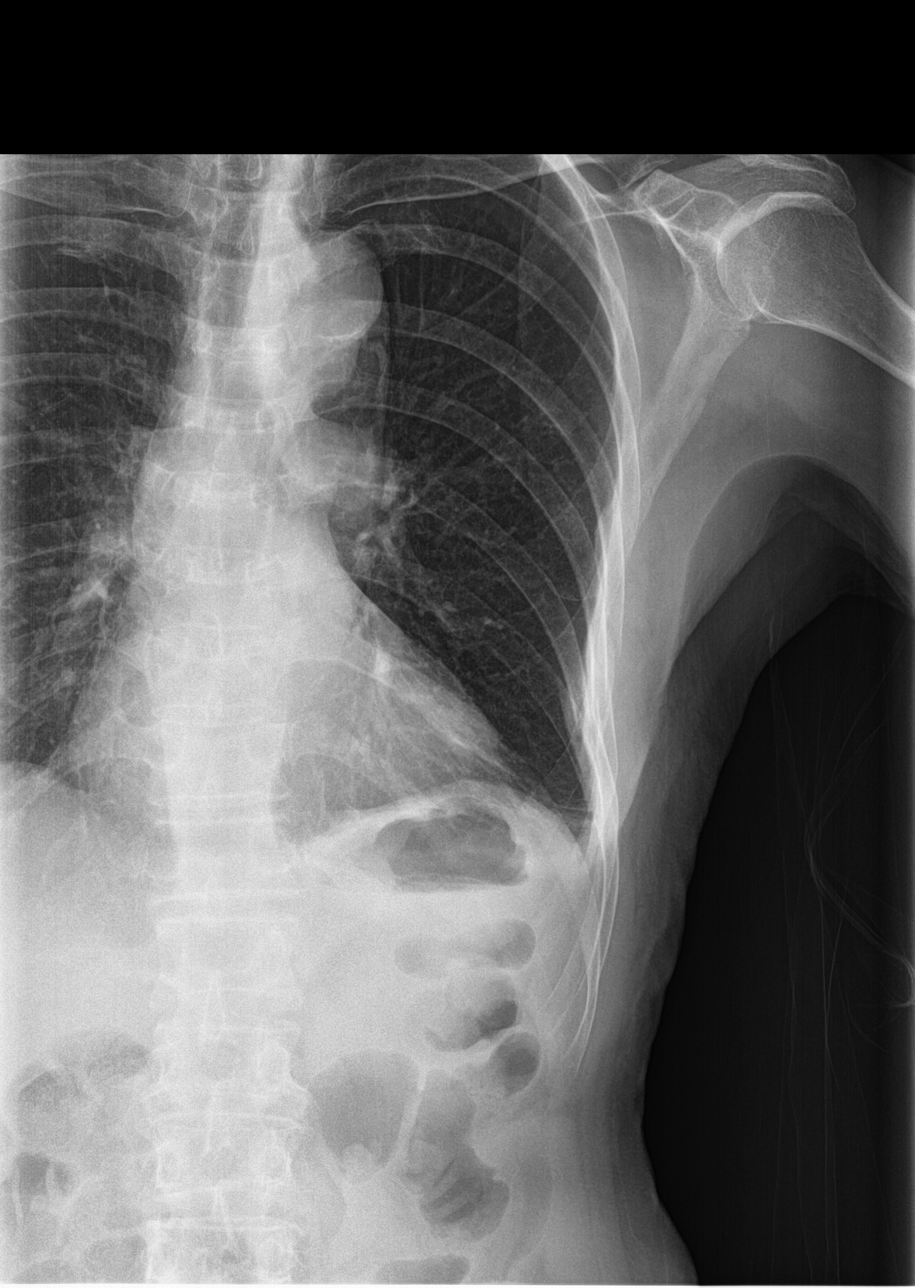

[rib obl (1 of 2)]
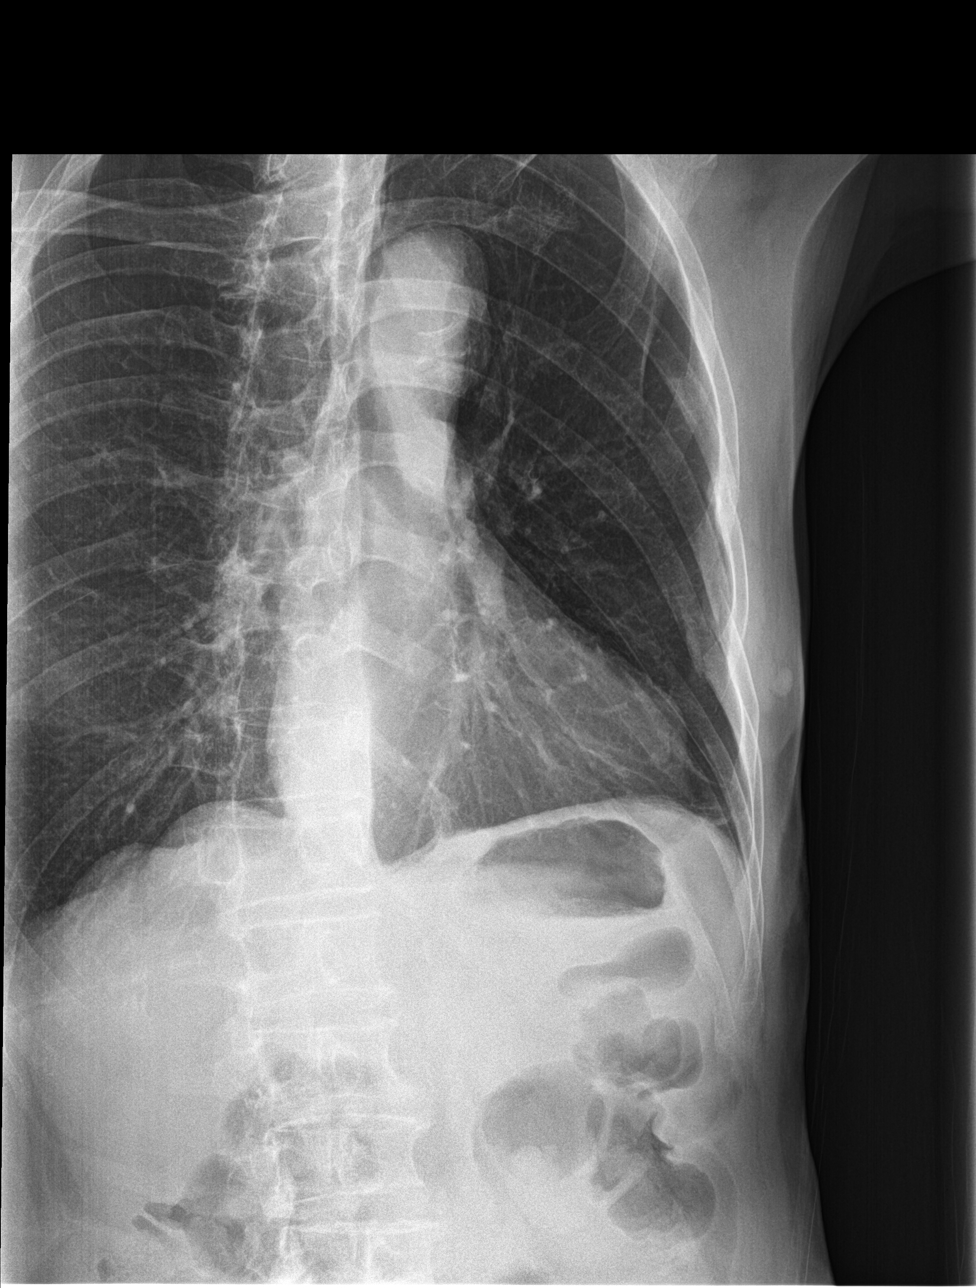

[rib obl (2 of 2)]
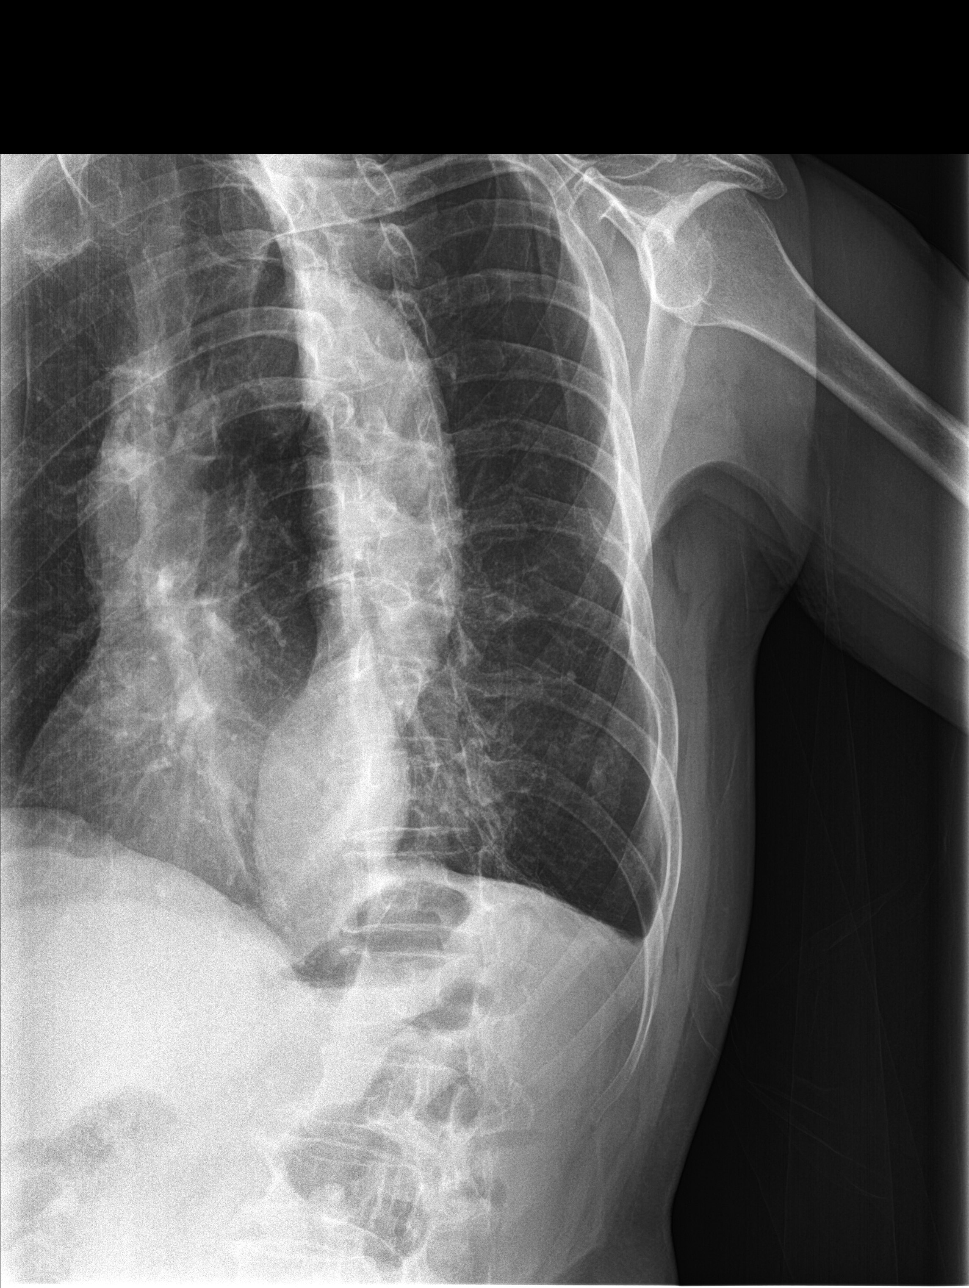

[4 of 4 positions shown; findings below may reference images not displayed]

FINDINGS: Normal heart size and pulmonary vascularity.

Atherosclerotic calcification and mild tortuosity of thoracic aorta.

Eventration RIGHT diaphragm stable.

Minimal residual atelectasis and minimal effusion at LEFT
costophrenic angle.

Remaining lungs clear.

No infiltrate or pneumothorax.

Bones demineralized.

Healing fractures of the LEFT third fourth fifth sixth and seventh
rib fractures identified.
IMPRESSION: Multiple healing LEFT rib fractures with decrease in LEFT pleural
effusion and basilar atelectasis since prior study.

## 2020-03-12 ENCOUNTER — Telehealth: Payer: Self-pay | Admitting: Emergency Medicine

## 2020-03-12 ENCOUNTER — Other Ambulatory Visit: Payer: Self-pay

## 2020-03-12 MED ORDER — METFORMIN HCL 1000 MG PO TABS: ORAL_TABLET | ORAL | 0 refills | Status: DC

## 2020-03-12 NOTE — Telephone Encounter (Signed)
RX REFILL metFORMIN (GLUCOPHAGE) 1000 MG tablet [[id number] [facility] [address], [place] [address] [place] Phone:  [phone]  Fax:  [phone]

## 2020-03-26 ENCOUNTER — Encounter: Payer: Self-pay | Admitting: Emergency Medicine

## 2020-03-26 ENCOUNTER — Other Ambulatory Visit: Payer: Self-pay

## 2020-03-26 ENCOUNTER — Ambulatory Visit (INDEPENDENT_AMBULATORY_CARE_PROVIDER_SITE_OTHER): Payer: Medicare HMO | Admitting: Emergency Medicine

## 2020-03-26 VITALS — BP 161/77 | HR 63 | Temp 98.2°F | Resp 16 | Ht 65.0 in | Wt 122.0 lb

## 2020-03-26 DIAGNOSIS — E1159 Type 2 diabetes mellitus with other circulatory complications: Secondary | ICD-10-CM | POA: Diagnosis not present

## 2020-03-26 DIAGNOSIS — I152 Hypertension secondary to endocrine disorders: Secondary | ICD-10-CM

## 2020-03-26 DIAGNOSIS — I10 Essential (primary) hypertension: Secondary | ICD-10-CM

## 2020-03-26 DIAGNOSIS — B029 Zoster without complications: Secondary | ICD-10-CM | POA: Diagnosis not present

## 2020-03-26 MED ORDER — VALACYCLOVIR HCL 1 G PO TABS: 1000.0000 mg | ORAL_TABLET | Freq: Two times a day (BID) | ORAL | 0 refills | Status: AC

## 2020-03-26 MED ORDER — TRAMADOL HCL 50 MG PO TABS: 50.0000 mg | ORAL_TABLET | Freq: Three times a day (TID) | ORAL | 0 refills | Status: AC | PRN

## 2020-03-26 NOTE — Progress Notes (Signed)
Erik Aguirre 79 y.o.   Chief Complaint  Patient presents with  . Rash    for 10 day on right side and back with pain/itching    HISTORY OF PRESENT ILLNESS: This is a 79 y.o. male complaining of painful itchy rash to right side of his chest that started 10 days ago. No other significant symptoms.  HPI   Prior to Admission medications   Medication Sig Start Date End Date Taking? Authorizing Provider  metFORMIN (GLUCOPHAGE) 1000 MG tablet TAKE 1 TABLET BY MOUTH TWICE DAILY WITH A MEAL 03/12/20  Yes Toran Murch, Ines Bloomer, MD  Multiple Vitamin (MULTIVITAMIN) capsule Take 1 capsule by mouth daily. Reported on 01/15/2016   Yes [provider]  rosuvastatin (CRESTOR) 10 MG tablet Take 1 tablet (10 mg total) by mouth daily. 11/27/19  Yes Ruweyda Macknight, Ines Bloomer, MD  doxazosin (CARDURA) 2 MG tablet Take 1 tablet (2 mg total) by mouth daily. 05/30/19 11/27/19  Horald Pollen, MD  traMADol (ULTRAM) 50 MG tablet Take 1 tablet (50 mg total) by mouth every 8 (eight) hours as needed. Patient not taking: Reported on 03/26/2020 11/24/17   Horald Pollen, MD    Allergies  Allergen Reactions  . Lisinopril Cough  . 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 Dyslipidemia 11/01/2014  . Hypertension associated with diabetes ([place]) 07/02/2014  . Diabetes mellitus with no complication ([place]) 07/37/1062  . 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 Benign prostatic hyperplasia (BPH) with urinary urgency 03/17/2013  . BPH (benign prostatic hyperplasia) 03/17/2013    Past Medical History:  Diagnosis Date  . Acute appendicitis s/p alp appy 11/27/2014 11/26/2014  . BPH (benign prostatic hyperplasia)   . Hyperlipidemia   . Hypertension   . 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 LAPAROSCOPIC APPENDECTOMY N/A 11/27/2014   Procedure:  APPENDECTOMY LAPAROSCOPIC ;  Surgeon: Michael Boston, MD;  Location: [facility];  Service: General;  Laterality: N/A;    Social History   Socioeconomic History  . Marital status: Married    Spouse name: Not on file  . Number of children: 5  . Years of education: Not on file  . Highest education level: Not on file  Occupational History  . Occupation: Retired  Tobacco Use  . Smoking status: Never Smoker  . Smokeless tobacco: Never Used  Vaping Use  . Vaping Use: Never used  Substance and Sexual Activity  . Alcohol use: Yes    Comment: occ  . Drug use: No  . Sexual activity: Not on file  Other Topics Concern  . Not on file  Social History Narrative   Originally from [employer] [place].   Language of English okay   Social Determinants of Health   Financial Resource Strain:   . Difficulty of Paying Living Expenses:   Food Insecurity:   . Worried About Charity fundraiser in the Last Year:   . Arboriculturist in the Last Year:   Transportation Needs:   . Film/video editor (Medical):   Marland Kitchen Lack of Transportation (Non-Medical):   Physical Activity:   . Days of Exercise per Week:   . Minutes of Exercise per Session:   Stress:   . Feeling of Stress :   Social Connections:   . Frequency of Communication with Friends and Family:   .  Frequency of Social Gatherings with Friends and Family:   . Attends Religious Services:   . Active Member of Clubs or Organizations:   . Attends Archivist Meetings:   Marland Kitchen Marital Status:   Intimate Partner Violence:   . Fear of Current or Ex-Partner:   . Emotionally Abused:   Marland Kitchen Physically Abused:   . Sexually Abused:     Family History  Problem Relation Age of Onset  . Throat cancer Brother   . Asthma Son      Review of Systems  Constitutional: Negative.  Negative for chills and fever.  HENT: Negative.  Negative for congestion and sore throat.   Respiratory: Negative.  Negative for cough and shortness of breath.     Cardiovascular: Negative.  Negative for chest pain and palpitations.  Gastrointestinal: Negative for abdominal pain, diarrhea, nausea and vomiting.  Genitourinary: Negative.  Negative for dysuria.  Skin: Positive for itching and rash.  Neurological: Negative for dizziness and headaches.  All other systems reviewed and are negative.   Today's Vitals   03/26/20 1500  BP: (!) 161/77  Pulse: 63  Resp: 16  Temp: 98.2 F (36.8 C)  TempSrc: Temporal  SpO2: 97%  Weight: 122 lb (55.3 kg)  Height: '5\' 5"'$  (1.651 m)   Body mass index is 20.3 kg/m.  Physical Exam Vitals reviewed.  Constitutional:      Appearance: Normal appearance.  HENT:     Head: Normocephalic.  Eyes:     Extraocular Movements: Extraocular movements intact.     Pupils: Pupils are equal, round, and reactive to light.  Cardiovascular:     Rate and Rhythm: Normal rate.  Pulmonary:     Effort: Pulmonary effort is normal.  Musculoskeletal:     Cervical back: Normal range of motion and neck supple.  Skin:    General: Skin is warm and dry.     Capillary Refill: Capillary refill takes less than 2 seconds.     Findings: Rash present.     Comments: Shingles rash on T5 dermatome distribution right side  Neurological:     General: No focal deficit present.     Mental Status: He is alert and oriented to person, place, and time.  Psychiatric:        Mood and Affect: Mood normal.        Behavior: Behavior normal.      ASSESSMENT & PLAN: Djon was seen today for rash.  Diagnoses and all orders for this visit:  Herpes zoster without complication -     traMADol (ULTRAM) 50 MG tablet; Take 1 tablet (50 mg total) by mouth every 8 (eight) hours as needed for up to 5 days for moderate pain or severe pain. -     valACYclovir (VALTREX) 1000 MG tablet; Take 1 tablet (1,000 mg total) by mouth 2 (two) times daily for 7 days.  Hypertension associated with diabetes [facility]) -     Ambulatory referral to  Ophthalmology    Patient Instructions       If you have lab work done today you will be contacted with your lab results within the next 2 weeks.  If you have not heard from [place] then please contact us. The fastest way to get your results is to register for My Chart.   IF you received an x-ray today, you will receive an invoice from [facility] Radiology. Please contact [facility]iology at [phone] with questions or concerns regarding your invoice.   IF you received labwork  today, you will receive an invoice from [place]. Please contact [facility] at [phone] with questions or concerns regarding your invoice.   Our billing staff will not be able to assist you with questions regarding bills from these companies.  You will be contacted with the lab results as soon as they are available. The fastest way to get your results is to activate your My Chart account. Instructions are located on the last page of this paperwork. If you have not heard from [place] regarding the results in 2 weeks, please contact this office.     B?nh Zona Shingles  B?nh zona, cn ???c g?i l gi??i leo (herpes zoster), l m?t b?nh nhi?m trng gy pht ban ?au ???n trn da v m?n n??c ch?a ??y d?ch. B?nh ny do vi rt gy ra. B?nh zona ch? pht tri?n ? nh?ng ng??i:  ?a? bi? b?nh thu?y ??u.  ? ???c cho dng thu?c ?? phng b?nh th?y ??u (? ???c tim v?c xin). B?nh zona hi?m g?p trong nhm ny. Nguyn nhn g gy ra? B?nh zona do vi ru?t varicella-zoster (VZV) gy ra. ?y l cu?ng la? lo?i vi rt gy b?nh th?y ??u. Sau khi ti?p xc v?i VZV, vi ru?t na?y ?? la?i trong c? th? trong tra?ng tha?i b?t ho?t (ng?). B?nh zona s? pht tri?n n?u vi ru?t hoa?t ??ng tr?? la?i. ?i?u ny c th? x?y ra nhi?u n?m sau khi ti?p xc v?i VZV ??u tin (l?n ??u). Ch?a ro? nguyn nhn la?m cho vi ru?t hoa?t ??ng tr?? la?i. ?i?u g lm t?ng nguy c?? Nh?ng ng??i ? b? b?nh th?y ??u ho?c ?a? ???c tim v?c xin ng?a th?y ??u c nguy c?  b? b?nh zona. M?c b?nh zona ph? bi?n h?n ? nh?ng ng??i:  Trn 60 tu?i.  Co? h? th?ng pho?ng ch?ng b?nh t?t (h?mi?n d?ch) y?u, ch?ng h?n nh?ng ng??i c: ? HIV. ? AIDS. ? Ung th?.  ?ang dng cc lo?i thu?c lm suy y?u h? mi?n d?ch, ch?ng h?n nh? thu?c c?y ghp.  Tr?i qua r?t nhi?u c?ng th?ng. Cc d?u hi?u ho?c tri?u ch?ng l g? Cc tri?u ch?ng s?m c?a b?nh ny bao g?m ng?a, ?au bu?t v ?au ? m?t khu v?c trn da c?a quy? vi?. ?au c th? ???c m t? l rt b?ng, nh? bi? ?m ho?c ?au nhi. Vi ngy ho?c vi tu?n t? cc tri?u ch?ng ban ??u, pht ban ?? gy ?au xu?t hi?n. Pht ban th??ng xu?t hi?n ? m?t bn c? th? v c d?ng d?i ho?c d?ng ?ai. Cu?i cng, ban chuy?n sang m?n n??c ch?a di?ch v? ra, chuy?n thnh v?y v kh trong kho?ng 2-3 tu?n. Vo b?t k? th?i gian na?o trong qu trnh nhi?m tru?ng, quy? vi? co? th? bi?:  S?t.  ?n l?nh.  ?au ??u.  C?m gic kh ch?u ? b?ng. Ch?n ?on tnh tr?ng ny nh? th? no? B?nh ny c th? ???c ch?n ?on b?ng cch khm da. M?u da ho?c m?u di?ch co? th? ????c l?y t? cc m?n n??c tr??c khi ch?n ?on. Nh?ng m?u na?y s? ???c ki?m tra d??i knh hi?n vi ho?c g?i ??n m?t phng xt nghi?m ?? xe?t nghi?m. Tnh tr?ng ny ???c ?i?u tr? nh? th? no? Pht ban c th? ko di vi tu?n. Khng c cch ch?a c? th? cho tnh tr?ng ny. Chuyn gia ch?m [place] s?c kh?e c?a quy? vi? c th? s? k ??n thu?c ?? gip quy? vi? gia?m ?au, ph?c h?i nhanh chng h?n v trnh cc v?n ?? lu  di. Thu?c c th? bao g?m:  Thu?c kha?ng vi ru?t.  Thu?c ch?ng vim.  Thu?c gi?m ?au.  Thu?c ch?ng ng?a (thu?c khng histamine). N?u vng ? ? trn m?t, qu v? c th? ???c gi?i thi?u ??n m?t bc s? chuyn khoa, ch?ng h?n nh? bc s? chuyn khoa m?t (bc s? nhn khoa) ho?c bc s? tai, m?i, v h?ng (ENT) (bc s? tai m?i h?ng) nh?m gip qu v? trnh b? cc v?n ?? v? m?t, ?au m?n tnh, ho?c tn t?t. Tun th? nh?ng h??ng d?n ny ? nh: Thu?c  Ch? s? d?ng thu?c khng k ??n v thu?c k ??n  theo ch? d?n c?a chuyn gia ch?m [place] s?c kh?e.  Bi kem ch?ng ng?a ho?c kem thu?c gy t vo vng b? ?nh h??ng theo h??ng d?n c?a chuyn gia ch?m [place] s?c kh?e c?a quy? vi?. Gi?m ng?a v gi?m c?m gic kh ch?u   Ch??m kh?n ??t, l?nh (ch??m l?nh) vo vng pht ban ho?c m?n n??c theo ch? d?n c?a chuyn gia ch?m [place] s?c kh?e.  T?m n??c mt c th? ?? ng?a. Th? cho thm thu?c mu?i (baking soda) ho?c b?t y?n m?ch kh vo n??c ?? lm gi?m ng?a. Khng t?m n??c nng. Ch?m so?c mu?n n??c va? pha?t ban  Che ch? pha?t ban la?i b??ng b?ng lo?ng (g?c). M??c qu?n a?o r?ng ?? giu?p gia?m ?au do va?i qu?n a?o co? va?o ch? pha?t ban.  Gi?? cho ch? pht ban v m?n n??c c?a quy? vi? s?ch s? b?ng cch r?a vng ny v?i x pho?ng nh? v n??c mt ho?c theo ch? d?n c?a chuyn gia ch?m [place] s?c kh?e c?a quy? vi?.  Ki?m tra ch? pha?t ban m?i ngy xem c d?u hi?u nhi?m trng hay khng. Ki?m tra xem c: ? ??, s?ng n?, ho?c ?au nhi?u h?n. ? Di?ch ho?c mu. ? ?m. ? M? ho?c mi hi.  Khng gi ch? pht ban ho?c ch?c vo m?n n??c. ?? gip qu v? trnh gi: ? C?t ng?n v gi? mng tay s?ch s?. ? ?eo g?ng tay lc ng?, n?u gi l v?n ?? c?a qu v?. H??ng d?n chung  Ngh? ng?i theo ch? d?n c?a chuyn gia ch?m [place] s?c kh?e.  Tun th? t?t c? cc l?n khm theo di theo ch? d?n c?a chuyn gia ch?m [place] s?c kh?e. ?i?u ny c vai tr quan tr?ng.  R?a tay th???ng xuyn b?ng x phng v n??c. N?u khng c x phng v n??c, hy dng thu?c st trng tay. Lm nh? v?y gip gi?m nguy c? nhi?m trng da do vi khu?n.  Tr??c khi m?n n??c cu?a quy? vi? ?o?ng va?y, tnh tr?ng nhi?m b?nh zona c?a qu v? c th? gy b?nh th?y ??u ? nh?ng ng??i ch?a bao gi? bi? ho?c ch?a bao gi? ????c tim v??c xin pho?ng b?nh ny. ?? ng?n ng?a ?i?u ny x?y ra, hy trnh ti?p xc v?i nh?ng ng??i khc, ??c bi?t l: ? Tre? em. ? Ph? n? c thai. ? Tr? em b? b?nh chm (eczema). ? Ng??i cao tu?i c c? quan c?y ghp. ? Nh?ng ng??i b? b?nh ma?n tnh, nh? ung  th? ho?c AIDS. Hy lin l?c v?i chuyn gia ch?m [place] s?c kh?e n?u:  Qu v? khng gia?m ?au sau khi dng thu?c ? k ??n.  C?n ?au cu?a quy? vi? khng ??? h?n sau khi lnh pht ban.  Qu v? c cc d?u hi?u nhi?m trng ? vng pht ban, ch?ng h?n nh?: ? T?y ??, s?ng, ho?c ?au h?n  Holli Humbles ch? pht ban. ? D?ch ho?c mu t? ch? pht ban ch?y ra. ? C?m gic ?m nng vng pht ban khi ch?m vo. ? M? ho?c mi hi thot ra ? ch? pht ban. Yu c?u tr? gip ngay l?p t?c n?u:  Pht ban ? trn m?t ho?c trn m?i cu?a quy? vi?.  Quy? vi? bi? ?au ?? m?t, ?au xung quanh vng m?t ho?c m?t c?m gic ? m?t bn m?t.  Qu v? [place]? vi? bi? ?au tai ho?c bi? u? tai.  Quy? vi? m?t vi? gia?c.  Tnh tr?ng c?a qu v? tr?m tr?ng h?n. Tm t?t  B?nh zona, cn ???c g?i l gi??i leo (herpes zoster), l m?t b?nh nhi?m trng gy pht ban ?au ???n trn da v m?n n??c ch?a ??y d?ch.  B?nh ny c th? ???c ch?n ?on b?ng cch khm da. M?u da ho?c m?u di?ch co? th? ????c l?y t? cc m?n n??c v ???c xt nghi?m tr??c khi ch?n ?on.  Che ch? pha?t ban la?i b??ng b?ng lo?ng (g?c). M??c qu?n a?o r?ng ?? giu?p gia?m ?au do va?i qu?n a?o co? va?o ch? pha?t ban.  Tr??c khi m?n n??c cu?a quy? vi? ?o?ng va?y, tnh tr?ng nhi?m b?nh zona c?a qu v? c th? gy b?nh th?y ??u ? nh?ng ng??i ch?a bao gi? bi? ho?c ch?a bao gi? ????c tim v??c xin pho?ng b?nh ny. Thng tin ny khng nh?m m?c ?ch thay th? cho l?i khuyn m chuyn gia ch?m [place] s?c kh?e ni v?i qu v?. Hy b?o ??m qu v? ph?i th?o lu?n b?t k? v?n ?? g m qu v? c v?i chuyn gia ch?m [place] s?c kh?e c?a qu v?. Document Revised: 07/09/2017 Document Reviewed: 07/09/2017 Elsevier Patient Education  2020 Elsevier Inc.      Agustina Caroli, MD [facility]

## 2020-03-26 NOTE — Patient Instructions (Addendum)
If you have lab work done today you will be contacted with your lab results within the next 2 weeks.  If you have not heard from [place] then please contact us. The fastest way to get your results is to register for My Chart.   IF you received an x-ray today, you will receive an invoice from [facility] Radiology. Please contact [facility] Radiology at [phone] with questions or concerns regarding your invoice.   IF you received labwork today, you will receive an invoice from [facility]. Please contact [facility] at [phone] with questions or concerns regarding your invoice.   Our billing staff will not be able to assist you with questions regarding bills from these companies.  You will be contacted with the lab results as soon as they are available. The fastest way to get your results is to activate your My Chart account. Instructions are located on the last page of this paperwork. If you have not heard from [place] regarding the results in 2 weeks, please contact this office.     B?nh Zona Shingles  B?nh zona, cn ???c g?i l gi??i leo (herpes zoster), l m?t b?nh nhi?m trng gy pht ban ?au ???n trn da v m?n n??c ch?a ??y d?ch. B?nh ny do vi rt gy ra. B?nh zona ch? pht tri?n ? nh?ng ng??i:  ?a? bi? b?nh thu?y ??u.  ? ???c cho dng thu?c ?? phng b?nh th?y ??u (? ???c tim v?c xin). B?nh zona hi?m g?p trong nhm ny. Nguyn nhn g gy ra? B?nh zona do vi ru?t varicella-zoster (VZV) gy ra. ?y l cu?ng la? lo?i vi rt gy b?nh th?y ??u. Sau khi ti?p xc v?i VZV, vi ru?t na?y ?? la?i trong c? th? trong tra?ng tha?i b?t ho?t (ng?). B?nh zona s? pht tri?n n?u vi ru?t hoa?t ??ng tr?? la?i. ?i?u ny c th? x?y ra nhi?u n?m sau khi ti?p xc v?i VZV ??u tin (l?n ??u). Ch?a ro? nguyn nhn la?m cho vi ru?t hoa?t ??ng tr?? la?i. ?i?u g lm t?ng nguy c?? Nh?ng ng??i ? b? b?nh th?y ??u ho?c ?a? ???c tim v?c xin ng?a th?y ??u c nguy c? b? b?nh zona. M?c b?nh zona ph? bi?n h?n ?  nh?ng ng??i:  Trn 60 tu?i.  Co? h? th?ng pho?ng ch?ng b?nh t?t (h?mi?n d?ch) y?u, ch?ng h?n nh?ng ng??i c: ? HIV. ? AIDS. ? Ung th?.  ?ang dng cc lo?i thu?c lm suy y?u h? mi?n d?ch, ch?ng h?n nh? thu?c c?y ghp.  Tr?i qua r?t nhi?u c?ng th?ng. Cc d?u hi?u ho?c tri?u ch?ng l g? Cc tri?u ch?ng s?m c?a b?nh ny bao g?m ng?a, ?au bu?t v ?au ? m?t khu v?c trn da c?a quy? vi?. ?au c th? ???c m t? l rt b?ng, nh? bi? ?m ho?c ?au nhi. Vi ngy ho?c vi tu?n t? cc tri?u ch?ng ban ??u, pht ban ?? gy ?au xu?t hi?n. Pht ban th??ng xu?t hi?n ? m?t bn c? th? v c d?ng d?i ho?c d?ng ?ai. Cu?i cng, ban chuy?n sang m?n n??c ch?a di?ch v? ra, chuy?n thnh v?y v kh trong kho?ng 2-3 tu?n. Vo b?t k? th?i gian na?o trong qu trnh nhi?m tru?ng, quy? vi? co? th? bi?:  S?t.  ?n l?nh.  ?au ??u.  C?m gic kh ch?u ? b?ng. Ch?n ?on tnh tr?ng ny nh? th? no? B?nh ny c th? ???c ch?n ?on b?ng cch khm da. M?u da ho?c m?u di?ch co? th? ????c l?y t? cc m?n n??c tr??c  khi ch?n ?on. Nh?ng m?u na?y s? ???c ki?m tra d??i knh hi?n vi ho?c g?i ??n m?t phng xt nghi?m ?? xe?t nghi?m. Tnh tr?ng ny ???c ?i?u tr? nh? th? no? Pht ban c th? ko di vi tu?n. Khng c cch ch?a c? th? cho tnh tr?ng ny. Chuyn gia ch?m [place] s?c kh?e c?a quy? vi? c th? s? k ??n thu?c ?? gip quy? vi? gia?m ?au, ph?c h?i nhanh chng h?n v trnh cc v?n ?? lu di. Thu?c c th? bao g?m:  Thu?c kha?ng vi ru?t.  Thu?c ch?ng vim.  Thu?c gi?m ?au.  Thu?c ch?ng ng?a (thu?c khng histamine). N?u vng ? ? trn m?t, qu v? c th? ???c gi?i thi?u ??n m?t bc s? chuyn khoa, ch?ng h?n nh? bc s? chuyn khoa m?t (bc s? nhn khoa) ho?c bc s? tai, m?i, v h?ng (ENT) (bc s? tai m?i h?ng) nh?m gip qu v? trnh b? cc v?n ?? v? m?t, ?au m?n tnh, ho?c tn t?t. Tun th? nh?ng h??ng d?n ny ? nh: Thu?c  Ch? s? d?ng thu?c khng k ??n v thu?c k ??n theo ch? d?n c?a chuyn gia ch?m [place] s?c  kh?e.  Bi kem ch?ng ng?a ho?c kem thu?c gy t vo vng b? ?nh h??ng theo h??ng d?n c?a chuyn gia ch?m [place] s?c kh?e c?a quy? vi?. Gi?m ng?a v gi?m c?m gic kh ch?u   Ch??m kh?n ??t, l?nh (ch??m l?nh) vo vng pht ban ho?c m?n n??c theo ch? d?n c?a chuyn gia ch?m [place] s?c kh?e.  T?m n??c mt c th? ?? ng?a. Th? cho thm thu?c mu?i (baking soda) ho?c b?t y?n m?ch kh vo n??c ?? lm gi?m ng?a. Khng t?m n??c nng. Ch?m so?c mu?n n??c va? pha?t ban  Che ch? pha?t ban la?i b??ng b?ng lo?ng (g?c). M??c qu?n a?o r?ng ?? giu?p gia?m ?au do va?i qu?n a?o co? va?o ch? pha?t ban.  Gi?? cho ch? pht ban v m?n n??c c?a quy? vi? s?ch s? b?ng cch r?a vng ny v?i x pho?ng nh? v n??c mt ho?c theo ch? d?n c?a chuyn gia ch?m [place] s?c kh?e c?a quy? vi?.  Ki?m tra ch? pha?t ban m?i ngy xem c d?u hi?u nhi?m trng hay khng. Ki?m tra xem c: ? ??, s?ng n?, ho?c ?au nhi?u h?n. ? Di?ch ho?c mu. ? ?m. ? M? ho?c mi hi.  Khng gi ch? pht ban ho?c ch?c vo m?n n??c. ?? gip qu v? trnh gi: ? C?t ng?n v gi? mng tay s?ch s?. ? ?eo g?ng tay lc ng?, n?u gi l v?n ?? c?a qu v?. H??ng d?n chung  Ngh? ng?i theo ch? d?n c?a chuyn gia ch?m [place] s?c kh?e.  Tun th? t?t c? cc l?n khm theo di theo ch? d?n c?a chuyn gia ch?m  s?c kh?e. ?i?u ny c vai tr quan tr?ng.  R?a tay th???ng xuyn b?ng x phng v n??c. N?u khng c x phng v n??c, hy dng thu?c st trng tay. Lm nh? v?y gip gi?m nguy c? nhi?m trng da do vi khu?n.  Tr??c khi m?n n??c cu?a quy? vi? ?o?ng va?y, tnh tr?ng nhi?m b?nh zona c?a qu v? c th? gy b?nh th?y ??u ? nh?ng ng??i ch?a bao gi? bi? ho?c ch?a bao gi? ????c tim v??c xin pho?ng b?nh ny. ?? ng?n ng?a ?i?u ny x?y ra, hy trnh ti?p xc v?i nh?ng ng??i khc, ??c bi?t l: ? Tre? em. ? Ph? n? c thai. ? Tr? em b? b?nh chm (eczema). ?  Ng??i cao tu?i c c? [place] c?y ghp. ? Nh?ng ng??i b? b?nh ma?n tnh, nh? ung th? ho?c AIDS. Hy lin l?c v?i chuyn  gia ch?m [place] s?c kh?e n?u:  Qu v? khng gia?m ?au sau khi dng thu?c ? k ??n.  C?n ?au cu?a quy? vi? khng ??? h?n sau khi lnh pht ban.  Qu v? c cc d?u hi?u nhi?m trng ? vng pht ban, ch?ng h?n nh?: ? T?y ??, s?ng, ho?c ?au h?n quanh ch? pht ban. ? D?ch ho?c mu t? ch? pht ban ch?y ra. ? C?m gic ?m nng vng pht ban khi ch?m vo. ? M? ho?c mi hi thot ra ? ch? pht ban. Yu c?u tr? gip ngay l?p t?c n?u:  Pht ban ? trn m?t ho?c trn m?i cu?a quy? vi?.  Quy? vi? bi? ?au ?? m?t, ?au xung quanh vng m?t ho?c m?t c?m gic ? m?t bn m?t.  Qu v? [place]? vi? bi? ?au tai ho?c bi? u? tai.  Quy? vi? m?t vi? gia?c.  Tnh tr?ng c?a qu v? tr?m tr?ng h?n. Tm t?t  B?nh zona, cn ???c g?i l gi??i leo (herpes zoster), l m?t b?nh nhi?m trng gy pht ban ?au ???n trn da v m?n n??c ch?a ??y d?ch.  B?nh ny c th? ???c ch?n ?on b?ng cch khm da. M?u da ho?c m?u di?ch co? th? ????c l?y t? cc m?n n??c v ???c xt nghi?m tr??c khi ch?n ?on.  Che ch? pha?t ban la?i b??ng b?ng lo?ng (g?c). M??c qu?n a?o r?ng ?? giu?p gia?m ?au do va?i qu?n a?o co? va?o ch? pha?t ban.  Tr??c khi m?n n??c cu?a quy? vi? ?o?ng va?y, tnh tr?ng nhi?m b?nh zona c?a qu v? c th? gy b?nh th?y ??u ? nh?ng ng??i ch?a bao gi? bi? ho?c ch?a bao gi? ????c tim v??c xin pho?ng b?nh ny. Thng tin ny khng nh?m m?c ?ch thay th? cho l?i khuyn m chuyn gia ch?m [place] s?c kh?e ni v?i qu v?. Hy b?o ??m qu v? ph?i th?o lu?n b?t k? v?n ?? g m qu v? c v?i chuyn gia ch?m [place] s?c kh?e c?a qu v?. Document Revised: 07/09/2017 Document Reviewed: 07/09/2017 [employer] Patient Education  2020 [employer].

## 2020-05-20 ENCOUNTER — Other Ambulatory Visit: Payer: Self-pay | Admitting: Emergency Medicine

## 2020-05-23 DIAGNOSIS — H2589 Other age-related cataract: Secondary | ICD-10-CM | POA: Diagnosis not present

## 2020-05-23 DIAGNOSIS — H25811 Combined forms of age-related cataract, right eye: Secondary | ICD-10-CM | POA: Diagnosis not present

## 2020-05-27 ENCOUNTER — Ambulatory Visit (INDEPENDENT_AMBULATORY_CARE_PROVIDER_SITE_OTHER): Payer: Medicare HMO | Admitting: Emergency Medicine

## 2020-05-27 ENCOUNTER — Encounter: Payer: Self-pay | Admitting: Emergency Medicine

## 2020-05-27 ENCOUNTER — Other Ambulatory Visit: Payer: Self-pay

## 2020-05-27 VITALS — BP 140/80 | HR 67 | Temp 98.1°F | Ht 66.0 in | Wt 120.4 lb

## 2020-05-27 DIAGNOSIS — N401 Enlarged prostate with lower urinary tract symptoms: Secondary | ICD-10-CM

## 2020-05-27 DIAGNOSIS — Z23 Encounter for immunization: Secondary | ICD-10-CM

## 2020-05-27 DIAGNOSIS — E1169 Type 2 diabetes mellitus with other specified complication: Secondary | ICD-10-CM

## 2020-05-27 DIAGNOSIS — R3915 Urgency of urination: Secondary | ICD-10-CM | POA: Diagnosis not present

## 2020-05-27 DIAGNOSIS — E119 Type 2 diabetes mellitus without complications: Secondary | ICD-10-CM | POA: Diagnosis not present

## 2020-05-27 DIAGNOSIS — E785 Hyperlipidemia, unspecified: Secondary | ICD-10-CM

## 2020-05-27 DIAGNOSIS — E1165 Type 2 diabetes mellitus with hyperglycemia: Secondary | ICD-10-CM | POA: Diagnosis not present

## 2020-05-27 LAB — GLUCOSE, POCT (MANUAL RESULT ENTRY): POC Glucose: 181 mg/dl — AB (ref 70–99)

## 2020-05-27 LAB — POCT GLYCOSYLATED HEMOGLOBIN (HGB A1C): Hemoglobin A1C: 7 % — AB (ref 4.0–5.6)

## 2020-05-27 MED ORDER — DOXAZOSIN MESYLATE 2 MG PO TABS: 3.0000 mg | ORAL_TABLET | Freq: Every day | ORAL | 3 refills | Status: DC

## 2020-05-27 NOTE — Assessment & Plan Note (Signed)
Continues to have lower urinary tract symptoms. Tolerated Cardura 2 mg well. Will increase dose to 3 mg at bedtime. Referred to urologist.

## 2020-05-27 NOTE — Progress Notes (Signed)
Erik Aguirre 79 y.o.   Chief Complaint  Patient presents with  . Medical Management of Chronic Issues    6 m f/u   . Hypertension  . Diabetes    HISTORY OF PRESENT ILLNESS: This is a 79 y.o. male here for follow-up of chronic medical problems including hypertension and diabetes. Also has a history of longstanding BPH with lower urinary tract symptoms. Has failed multiple medications. Presently taking 2 mg of Cardura. History obtained with the help of [place] interpreter, [place]. BP Readings from Last 3 Encounters:  05/27/20 140/80  03/26/20 (!) 161/77  11/27/19 (!) 166/87  No other complaints or medical concerns today. Lab Results  Component Value Date   HGBA1C 6.6 (H) 11/27/2019    HPI   Prior to Admission medications   Medication Sig Start Date End Date Taking? Authorizing Provider  doxazosin (CARDURA) 2 MG tablet Take 1 tablet (2 mg total) by mouth daily. 05/30/19 05/27/20 Yes Thomas Rhude, Ines Bloomer, MD  ketorolac (ACULAR) 0.4 % SOLN Place 1 drop into the left eye 4 (four) times daily. 05/23/20  Yes [provider]  metFORMIN (GLUCOPHAGE) 1000 MG tablet TAKE 1 TABLET BY MOUTH TWICE DAILY WITH A MEAL 05/20/20  Yes Yannely Kintzel, Ines Bloomer, MD  Multiple Vitamin (MULTIVITAMIN) capsule Take 1 capsule by mouth daily. Reported on 01/15/2016   Yes [provider]  ofloxacin (OCUFLOX) 0.3 % ophthalmic solution Place 1 drop into the left eye 4 (four) times daily. 05/23/20  Yes [provider]  prednisoLONE acetate (PRED FORTE) 1 % ophthalmic suspension Place 1 drop into the left eye 4 (four) times daily. 05/23/20  Yes [provider]  rosuvastatin (CRESTOR) 10 MG tablet Take 1 tablet (10 mg total) by mouth daily. 11/27/19  Yes Horald Pollen, MD    Allergies  Allergen Reactions  . Lisinopril Cough  . Terazosin Hcl Other (See Comments)    Severe orthostatic hypotension resulting in falls    Patient Active Problem List   Diagnosis Date Noted   . Dyslipidemia 11/01/2014  . Hypertension associated with diabetes ([place]) 07/02/2014  . Diabetes mellitus with no complication ([place]) 16/06/9603  . 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 Benign prostatic hyperplasia (BPH) with urinary urgency 03/17/2013  . BPH (benign prostatic hyperplasia) 03/17/2013    Past Medical History:  Diagnosis Date  . Acute appendicitis s/p alp appy 11/27/2014 11/26/2014  . BPH (benign prostatic hyperplasia)   . Hyperlipidemia   . Hypertension   . 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 LAPAROSCOPIC APPENDECTOMY N/A 11/27/2014   Procedure: APPENDECTOMY LAPAROSCOPIC ;  Surgeon: Michael Boston, MD;  Location: [facility];  Service: General;  Laterality: N/A;    Social History   Socioeconomic History  . Marital status: Married    Spouse name: Not on file  . Number of children: 5  . Years of education: Not on file  . Highest education level: Not on file  Occupational History  . Occupation: Retired  Tobacco Use  . Smoking status: Never Smoker  . Smokeless tobacco: Never Used  Vaping Use  . Vaping Use: Never used  Substance and Sexual Activity  . Alcohol use: Yes    Comment: occ  . Drug use: No  . Sexual activity: Not on file  Other Topics Concern  . Not on file  Social History Narrative   Originally from [employer] [facility] [place].   Language of English okay   Social  Determinants of Health   Financial Resource Strain:   . Difficulty of Paying Living Expenses: Not on file  Food Insecurity:   . Worried About Charity fundraiser in the Last Year: Not on file  . Ran Out of Food in the Last Year: Not on file  Transportation Needs:   . Lack of Transportation (Medical): Not on file  . Lack of Transportation (Non-Medical): Not on file  Physical Activity:   . Days of Exercise per Week: Not on file  . Minutes of Exercise per Session: Not on file  Stress:   . Feeling of Stress : Not on file  Social  Connections:   . Frequency of Communication with Friends and Family: Not on file  . Frequency of Social Gatherings with Friends and Family: Not on file  . Attends Religious Services: Not on file  . Active Member of Clubs or Organizations: Not on file  . Attends Archivist Meetings: Not on file  . Marital Status: Not on file  Intimate Partner Violence:   . Fear of Current or Ex-Partner: Not on file  . Emotionally Abused: Not on file  . Physically Abused: Not on file  . Sexually Abused: Not on file    Family History  Problem Relation Age of Onset  . Throat cancer Brother   . Asthma Son      Review of Systems  Constitutional: Negative.  Negative for chills and fever.  HENT: Negative.  Negative for congestion and sore throat.   Respiratory: Negative.  Negative for cough and shortness of breath.   Cardiovascular: Negative.  Negative for chest pain and palpitations.  Gastrointestinal: Negative.  Negative for abdominal pain, diarrhea, nausea and vomiting.  Genitourinary: Positive for frequency.       Nocturia  Skin: Negative.  Negative for rash.  Neurological: Negative.  Negative for dizziness and headaches.  All other systems reviewed and are negative.  Today's Vitals   05/27/20 1358 05/27/20 1413  BP: (!) 146/77 140/80  Pulse: 67   Temp: 98.1 F (36.7 C)   TempSrc: Temporal   SpO2: 97%   Weight: 120 lb 6.4 oz (54.6 kg)   Height: 5' 6" (1.676 m)    Body mass index is 19.43 kg/m.   Physical Exam Vitals reviewed.  Constitutional:      Appearance: Normal appearance.  HENT:     Head: Normocephalic.  Eyes:     Extraocular Movements: Extraocular movements intact.     Pupils: Pupils are equal, round, and reactive to light.  Cardiovascular:     Rate and Rhythm: Normal rate and regular rhythm.     Pulses: Normal pulses.     Heart sounds: Normal heart sounds.  Pulmonary:     Effort: Pulmonary effort is normal.     Breath sounds: Normal breath sounds.    Musculoskeletal:        General: Normal range of motion.     Cervical back: Normal range of motion and neck supple.  Skin:    General: Skin is warm and dry.     Capillary Refill: Capillary refill takes less than 2 seconds.  Neurological:     General: No focal deficit present.     Mental Status: He is alert and oriented to person, place, and time.  Psychiatric:        Mood and Affect: Mood normal.        Behavior: Behavior normal.    Results for orders placed or performed in visit  on 05/27/20 (from the past 24 hour(s))  POCT glucose (manual entry)     Status: Abnormal   Collection Time: 05/27/20  2:14 PM  Result Value Ref Range   POC Glucose 181 (A) 70 - 99 mg/dl  POCT glycosylated hemoglobin (Hb A1C)     Status: Abnormal   Collection Time: 05/27/20  2:20 PM  Result Value Ref Range   Hemoglobin A1C 7.0 (A) 4.0 - 5.6 %   HbA1c POC (<> result, manual entry)     HbA1c, POC (prediabetic range)     HbA1c, POC (controlled diabetic range)     A total of 30 minutes was spent with the patient, greater than 50% of which was in counseling/coordination of care regarding multiple chronic medical problems including diabetes and BPH, treatment, review of all medications including increase of Cardura's dose to 3 mg at bedtime, review of most recent blood work results including today's hemoglobin A1c, need for urology evaluation, review of most recent office visit notes, prognosis and need for follow-up.   ASSESSMENT & PLAN: Dyslipidemia associated with type 2 diabetes mellitus (HCC) Hemoglobin A1c higher than before at 7.0. Continue Metformin 1000 mg twice a day. Diet and nutrition discussed with patient. Continue statin therapy with rosuvastatin 10 mg daily. Follow-up in 6 months.  Benign prostatic hyperplasia (BPH) with urinary urgency Continues to have lower urinary tract symptoms. Tolerated Cardura 2 mg well. Will increase dose to 3 mg at bedtime. Referred to urologist.  Jaesean was seen  today for medical management of chronic issues, hypertension and diabetes.  Diagnoses and all orders for this visit:  Type 2 diabetes mellitus with hyperglycemia, without long-term current use of insulin (HCC) -     Microalbumin / creatinine urine ratio -     POCT glycosylated hemoglobin (Hb A1C) -     POCT glucose (manual entry) -     Lipid panel -     CMP14+EGFR  Need for influenza vaccination -     Flu Vaccine QUAD High Dose(Fluad)  Benign prostatic hyperplasia (BPH) with urinary urgency -     doxazosin (CARDURA) 2 MG tablet; Take 1.5 tablets (3 mg total) by mouth daily. -     Ambulatory referral to Urology  Dyslipidemia associated with type 2 diabetes mellitus [facility])    Patient Instructions       If you have lab work done today you will be contacted with your lab results within the next 2 weeks.  If you have not heard from [place] then please contact us. The fastest way to get your results is to register for My Chart.   IF you received an x-ray today, you will receive an invoice from [facility]. Please contact [facility] at [phone] with questions or concerns regarding your invoice.   IF you received labwork today, you will receive an invoice from [place]. Please contact [facility] at [phone] with questions or concerns regarding your invoice.   Our billing staff will not be able to assist you with questions regarding bills from these companies.  You will be contacted with the lab results as soon as they are available. The fastest way to get your results is to activate your My Chart account. Instructions are located on the last page of this paperwork. If you have not heard from [place] regarding the results in 2 weeks, please contact this office.      T?ng s?n tuy?n ti?n li?t lnh tnh Benign Prostatic Hyperplasia  T?ng s?n tuy?n ti?n li?t lnh tnh (  BPH) l tuy?n ti?n li?t ph ??i do qu trnh lo ha bnh th??ng ch? khng ph?i do ung th? gy ra.  Tuy?n ti?n li?t l tuy?n c kch th??c b?ng qu? h? ?o, tham gia vo qu trnh s?n xu?t tinh d?ch. N n?m pha tr??c tr?c trng v bn d??i bng quang. Bng quang l?u tr? n??c ti?u v ni?u ??o l ?ng ??a n??c ti?u ra kh?i c? th?. Tuy?n ti?n li?t c th? l?n h?n khi ?n ng nhi?u tu?i h?n. Tuy?n ti?n li?t ph ??i c th? chn p vo ni?u ??o. ?i?u ny c th? lm cho kh ?i ti?u h?n. Tch t? n??c ti?u trong bng quang c th? gy nhi?m trng. p l?c ng??c dng v nhi?m trng c th? ti?n tri?n thnh t?n th??ng bng quang v suy th?n. Nguyn nhn g gy ra? Tnh tr?ng ny l m?t ph?n c?a qu trnh lo ha bnh th??ng. Tuy nhin, khng ph?i t?t c? nam gi?i ??u b? cc v?n ?? c?a tnh tr?ng ny. N?u tuy?n ti?n li?t ph ??i ra xa ni?u ??o, dng n??c ti?u s? khng b? ch?n. N?u n ph ??i h??ng v? ni?u ??o v chn p ni?u ??o, s? c v?n ?? v? ti?u ti?n. ?i?u g lm t?ng nguy c?? Tnh tr?ng ny d? x?y ra h?n ? nam gi?i trn 50 tu?i. Cc d?u hi?u ho?c tri?u ch?ng g? Nh?ng tri?u ch?ng c?a tnh tr?ng ny bao g?m:  Th?c d?y th??ng xuyn trong ?m ?? ?i ti?u.  C?n ?i ti?u th??ng xuyn trong ngy.  Kh b?t ??u dng n??c ti?u.  Gi?m kch th??c v ?? m?nh c?a dng n??c ti?u.  R? (nh? gi?t) sau khi ti?u ti?n.  Khng th? ?i ti?u. Tnh tr?ng ny c?n ph?i ?i?u tr? ngay l?p t?c.  Khng th? ?i h?t n??c ti?u trong bng quang.  ?au khi ?i ti?u. Tnh tr?ng ny ph? bi?n h?n n?u c?ng km theo nhi?m trng.  Nhi?m trng ???ng ti?u (UTI). Ch?n ?on tnh tr?ng ny nh? th? no? Tnh tr?ng ny ???c ch?n ?on d?a vo khm th?c th?, khai thc b?nh s? v cc tri?u ch?ng c?a qu v?. Cc ki?m tra c?ng s? ???c th?c hi?n, ch?ng h?n nh?:  Ch?p bng quang sau khi ?i ti?u. Bi?n php ny ?o b?t c? l??ng n??c ti?u no cn l?i trong bng quang sau khi qu v? ?i ti?u xong.  Khm tr?c trng b?ng ngn tay. Trong l?n ki?m tra tr?c trng, chuyn gia ch?m [place] s?c kh?e s? ki?m tra tuy?n ti?n li?t c?a qu v? b?ng cch cho m?t ngn tay ?eo  g?ng, ? bi tr?n vo tr?c trng ?? s? m?t sau c?a tuy?n ti?n li?t. Ki?m tra ny pht hi?n kch th??c c?a tuy?n v b?t c? kh?i u ho?c s? pht tri?n b?t th??ng no.  Xt nghi?m n??c ti?u (phn tch n??c ti?u).  Sng l?c khng nguyn ??c hi?u tuy?n ti?n li?t (PSA). ?y l m?t xt nghi?m mu ???c s? d?ng ?? sng l?c ung th? tuy?n ti?n li?t.  Siu m. Ki?m tra ny s? d?ng sng m thanh ?? t?o hnh ?nh tuy?n ti?n li?t b?ng ph??ng php ?i?n t?. Chuyn gia ch?m [place] s?c kh?e c th? gi?i thi?u qu v? ??n m?t chuyn gia v? b?nh th?n v b?nh tuy?n ti?n li?t (bc s? chuyn khoa ti?t ni?u). Tnh tr?ng ny ???c ?i?u tr? nh? th? no? Khi b?t ??u c tri?u ch?ng, chuyn gia ch?m [place] s?c kh?e s? theo di tnh tr?ng c?a qu v? (  theo di tch c?c ho?c ch? tch c?c). Vi?c ?i?u tr? ti?nh tra?ng ny ty thu?c vo m??c ?? n?ng c?a b?nh. ?i?u tr? c th? bao g?m:  Theo di v khm th??ng nin. ?y c th? l cch ?i?u tr? duy nh?t c?n thi?t n?u tnh tr?ng v cc tri?u ch?ng c?a qu v? nh?.  Thu?c ?? gia?m nhe? ca?c tri?u ch??ng, bao g?m: ? Thu?c lm thu nh? tuy?n ti?n li?t. ? Thu?c ?? th? gin cc c? c?a tuy?n ti?n li?t.  Ph?u thu?t trong tr??ng h?p n?ng. Ph?u thu?t c th? bao g?m: ? C?t b? tuy?n ti?n li?t. Trong th? thu?t ny, m tuy?n ti?n li?t ???c ???c lo?i b? hon ton qua m?t v?t m? m? ho?c qua n?i soi ? b?ng ho?c robot. ? C?t tuy?n ti?n li?t qua ni?u ??o (TURP). Trong th? thu?t ny, m?t d?ng c? ???c lu?n qua l? trn ??u d??ng v?t (ni?u ??o). N ???c s? d?ng ?? c?t b? m li bn trong c?a tuy?n ti?n li?t. Cc m?nh ny ???c lo?i b? thng qua cng m?t l? ? ??u d??ng v?t. Ph??ng php ny gip lo?i b? t?c ngh?n. ? R?ch qua ni?u ??o (TUIP). Trong th? thu?t ny, cc v?t r?ch nh? ???c t?o ra trong tuy?n ti?n li?t. Th? thu?t ny lm gi?m p l?c c?a tuy?n ti?n li?t ln ni?u ??o. ? Li?u php nhi?t vi sng ni?u ??o (TUMT). Th? thu?t ny s? d?ng vi sng ?? t?o ra nhi?t. Nhi?t s? ph h?y v lo?i b? m?t l??ng nh? m tuy?n  ti?n li?t. ? B?c h?i tuy?n ti?n li?t b?ng kim qua ni?u ??o (TUNA). Th? thu?t ny s? d?ng t?n s? radio ?? ph h?y v lo?i b? m?t l??ng nh? m tuy?n ti?n li?t. ? ?ng t? b?ng laser qua k? ([place]). Th? thu?t ny s? d?ng tia laser ?? ph h?y v lo?i b? m?t l??ng nh? m tuy?n ti?n li?t. ? Lm b?c h?i tuy?n ti?n li?t qua ni?u ??o (TUVP). Th? thu?t ny s? d?ng cc ?i?n c?c ?? ph h?y v lo?i b? m?t l??ng nh? m tuy?n ti?n li?t. ? Nng ni?u ??o tuy?n ti?n li?t. Th? thu?t ny ??a m?t m c?y vo ?? ??y cc thy c?a tuy?n ti?n li?t ra xa ni?u ??o. Tun th? nh?ng h??ng d?n ny ? nh:  Ch? s? d?ng thu?c khng k ??n v thu?c k ??n theo ch? d?n c?a chuyn gia ch?m [place] s?c kh?e.  Theo di cc tri?u ch?ng xem c b?t c? thay ??i no khng. Hy trao ??i v?i chuyn gia ch?m [place] s?c kh?e n?u c b?t k? thay ??i no.  Trnh u?ng nhi?u n??c tr??c khi ?i ng? ho?c ra ngoi n?i cng c?ng.  Trnh u?ng ho?c gi?m l??ng caffeine ho?c gi?m l??ng r??u qu v? u?ng.  T? cho qu v? th?i gian khi ti?u ti?n.  Tun th? t?t c? cc l?n khm theo di theo ch? d?n c?a chuyn gia ch?m [place] s?c kh?e. ?i?u ny c vai tr quan tr?ng. Hy lin l?c v?i chuyn gia ch?m [place] s?c kh?e n?u:  Qu v? b? ?au l?ng khng r nguyn nhn.  Cc tri?u ch?ng c?a qu v? khng ?? h?n sau khi ???c ?i?u tr?Sander Nephew v? b? nh?ng tc d?ng ph? c?a thu?c m qu v? ?ang dng.  N??c ti?u c?a qu v? tr? nn r?t s?m mu ho?c c mi hi.  Vng b?ng d??i c?a qu v? ch??ng ln v qu v? ?i ti?u kh kh?n. Yu c?u tr? gip ngay  l?p t?c n?u:  Qu v? b? s?t ho?c ?n l?nh.  Qu v? ??t nhin khng th? ti?u ti?n.  Qu v? c?m th?y chong vng, ho?c r?t chng m?t, ho?c ng?t x?u.  C m?t l??ng l?n mu ho?c c?c mu ?ng trong n??c ti?u.  V?n ?? ti?u ti?n tr? ln kh qu?n l.  Qu v? b? ?au th?t l?ng ho?c ?au m?ng s??n t? m?c ?? trung bnh ??n m?c ?? n?ng. M?ng s??n l phn bn c? th? gi?a x??ng s??n v hng. Nh?ng tri?u ch?ng ny c th? l bi?u hi?n c?a m?t v?n ??  nghim tr?ng c?n c?p c?u. Khng ch? xem tri?u ch?ng c h?t khng. Hy ?i khm ngay l?p t?c. G?i cho d?ch v? c?p c?u t?i ??a ph??ng (911 ? [place] Khng t? li xe ??n b?nh vi?n. Tm t?t  T?ng s?n tuy?n ti?n li?t lnh tnh (BPH) l tuy?n ti?n li?t ph ??i do qu trnh lo ha bnh th??ng ch? khng ph?i do ung th? gy ra.  Tuy?n ti?n li?t ph ??i c th? chn p vo ni?u ??o. ?i?u ny c th? lm cho kh ?i ti?u.  Tnh tr?ng ny l m?t ph?n c?a qu trnh lo ha bnh th??ng v d? x?y ra h?n ? nam gi?i trn 50 tu?i.  Yu c?u tr? gip ngay l?p t?c n?u qu v? ??t nhin khng th? ti?u ti?n. Thng tin ny khng nh?m m?c ?ch thay th? cho l?i khuyn m chuyn gia ch?m [place] s?c kh?e ni v?i qu v?. Hy b?o ??m qu v? ph?i th?o lu?n b?t k? v?n ?? g m qu v? c v?i chuyn gia ch?m [place] s?c kh?e c?a qu v?. Document Revised: 09/05/2018 Document Reviewed: 09/05/2018 Elsevier Patient Education  2020 Elsevier Inc.      Agustina Caroli, MD [facility]

## 2020-05-27 NOTE — Patient Instructions (Addendum)
If you have lab work done today you will be contacted with your lab results within the next 2 weeks.  If you have not heard from [place] then please contact us. The fastest way to get your results is to register for My Chart.   IF you received an x-ray today, you will receive an invoice from [facility]. Please contact [facility]iology at [phone] with questions or concerns regarding your invoice.   IF you received labwork today, you will receive an invoice from [place]. Please contact [facility] at [phone] with questions or concerns regarding your invoice.   Our billing staff will not be able to assist you with questions regarding bills from these companies.  You will be contacted with the lab results as soon as they are available. The fastest way to get your results is to activate your My Chart account. Instructions are located on the last page of this paperwork. If you have not heard from [place] regarding the results in 2 weeks, please contact this office.      T?ng s?n tuy?n ti?n li?t lnh tnh Benign Prostatic Hyperplasia  T?ng s?n tuy?n ti?n li?t lnh tnh (BPH) l tuy?n ti?n li?t ph ??i do qu trnh lo ha bnh th??ng ch? khng ph?i do ung th? gy ra. Tuy?n ti?n li?t l tuy?n c kch th??c b?ng qu? h? ?o, tham gia vo qu trnh s?n xu?t tinh d?ch. N n?m pha tr??c tr?c trng v bn d??i bng quang. Bng quang l?u tr? n??c ti?u v ni?u ??o l ?ng ??a n??c ti?u ra kh?i c? th?. Tuy?n ti?n li?t c th? l?n h?n khi ?n ng nhi?u tu?i h?n. Tuy?n ti?n li?t ph ??i c th? chn p vo ni?u ??o. ?i?u ny c th? lm cho kh ?i ti?u h?n. Tch t? n??c ti?u trong bng quang c th? gy nhi?m trng. p l?c ng??c dng v nhi?m trng c th? ti?n tri?n thnh t?n th??ng bng quang v suy th?n. Nguyn nhn g gy ra? Tnh tr?ng ny l m?t ph?n c?a qu trnh lo ha bnh th??ng. Tuy nhin, khng ph?i t?t c? nam gi?i ??u b? cc v?n ?? c?a tnh tr?ng ny. N?u tuy?n ti?n li?t ph ??i ra xa  ni?u ??o, dng n??c ti?u s? khng b? ch?n. N?u n ph ??i h??ng v? ni?u ??o v chn p ni?u ??o, s? c v?n ?? v? ti?u ti?n. ?i?u g lm t?ng nguy c?? Tnh tr?ng ny d? x?y ra h?n ? nam gi?i trn 50 tu?i. Cc d?u hi?u ho?c tri?u ch?ng g? Nh?ng tri?u ch?ng c?a tnh tr?ng ny bao g?m:  Th?c d?y th??ng xuyn trong ?m ?? ?i ti?u.  C?n ?i ti?u th??ng xuyn trong ngy.  Kh b?t ??u dng n??c ti?u.  Gi?m kch th??c v ?? m?nh c?a dng n??c ti?u.  R? (nh? gi?t) sau khi ti?u ti?n.  Khng th? ?i ti?u. Tnh tr?ng ny c?n ph?i ?i?u tr? ngay l?p t?c.  Khng th? ?i h?t n??c ti?u trong bng quang.  ?au khi ?i ti?u. Tnh tr?ng ny ph? bi?n h?n n?u c?ng km theo nhi?m trng.  Nhi?m trng ???ng ti?u (UTI). Ch?n ?on tnh tr?ng ny nh? th? no? Tnh tr?ng ny ???c ch?n ?on d?a vo khm th?c th?, khai thc b?nh s? v cc tri?u ch?ng c?a qu v?. Cc ki?m tra c?ng s? ???c th?c hi?n, ch?ng h?n nh?:  Ch?p bng quang sau khi ?i ti?u. Bi?n php ny ?o b?t c? l??ng n??c ti?u no cn l?i  trong bng quang sau khi qu v? ?i ti?u xong.  Khm tr?c trng b?ng ngn tay. Trong l?n ki?m tra tr?c trng, chuyn gia ch?m [place] s?c kh?e s? ki?m tra tuy?n ti?n li?t c?a qu v? b?ng cch cho m?t ngn tay ?eo g?ng, ? bi tr?n vo tr?c trng ?? s? m?t sau c?a tuy?n ti?n li?t. Ki?m tra ny pht hi?n kch th??c c?a tuy?n v b?t c? kh?i u ho?c s? pht tri?n b?t th??ng no.  Xt nghi?m n??c ti?u (phn tch n??c ti?u).  Sng l?c khng nguyn ??c hi?u tuy?n ti?n li?t (PSA). ?y l m?t xt nghi?m mu ???c s? d?ng ?? sng l?c ung th? tuy?n ti?n li?t.  Siu m. Ki?m tra ny s? d?ng sng m thanh ?? t?o hnh ?nh tuy?n ti?n li?t b?ng ph??ng php ?i?n t?. Chuyn gia ch?m [place] s?c kh?e c th? gi?i thi?u qu v? ??n m?t chuyn gia v? b?nh th?n v b?nh tuy?n ti?n li?t (bc s? chuyn khoa ti?t ni?u). Tnh tr?ng ny ???c ?i?u tr? nh? th? no? Khi b?t ??u c tri?u ch?ng, chuyn gia ch?m [place] s?c kh?e s? theo di tnh tr?ng c?a qu v? (theo di  tch c?c ho?c ch? tch c?c). Vi?c ?i?u tr? ti?nh tra?ng ny ty thu?c vo m??c ?? n?ng c?a b?nh. ?i?u tr? c th? bao g?m:  Theo di v khm th??ng nin. ?y c th? l cch ?i?u tr? duy nh?t c?n thi?t n?u tnh tr?ng v cc tri?u ch?ng c?a qu v? nh?.  Thu?c ?? gia?m nhe? ca?c tri?u ch??ng, bao g?m: ? Thu?c lm thu nh? tuy?n ti?n li?t. ? Thu?c ?? th? gin cc c? c?a tuy?n ti?n li?t.  Ph?u thu?t trong tr??ng h?p n?ng. Ph?u thu?t c th? bao g?m: ? C?t b? tuy?n ti?n li?t. Trong th? thu?t ny, m tuy?n ti?n li?t ???c ???c lo?i b? hon ton qua m?t v?t m? m? ho?c qua n?i soi ? b?ng ho?c robot. ? C?t tuy?n ti?n li?t qua ni?u ??o (TURP). Trong th? thu?t ny, m?t d?ng c? ???c lu?n qua l? trn ??u d??ng v?t (ni?u ??o). N ???c s? d?ng ?? c?t b? m li bn trong c?a tuy?n ti?n li?t. Cc m?nh ny ???c lo?i b? thng qua cng m?t l? ? ??u d??ng v?t. Ph??ng php ny gip lo?i b? t?c ngh?n. ? R?ch qua ni?u ??o (TUIP). Trong th? thu?t ny, cc v?t r?ch nh? ???c t?o ra trong tuy?n ti?n li?t. Th? thu?t ny lm gi?m p l?c c?a tuy?n ti?n li?t ln ni?u ??o. ? Li?u php nhi?t vi sng ni?u ??o (TUMT). Th? thu?t ny s? d?ng vi sng ?? t?o ra nhi?t. Nhi?t s? ph h?y v lo?i b? m?t l??ng nh? m tuy?n ti?n li?t. ? B?c h?i tuy?n ti?n li?t b?ng kim qua ni?u ??o (TUNA). Th? thu?t ny s? d?ng t?n s? radio ?? ph h?y v lo?i b? m?t l??ng nh? m tuy?n ti?n li?t. ? ?ng t? b?ng laser qua k? (ILC). Th? thu?t ny s? d?ng tia laser ?? ph h?y v lo?i b? m?t l??ng nh? m tuy?n ti?n li?t. ? Lm b?c h?i tuy?n ti?n li?t qua ni?u ??o (TUVP). Th? thu?t ny s? d?ng cc ?i?n c?c ?? ph h?y v lo?i b? m?t l??ng nh? m tuy?n ti?n li?t. ? Nng ni?u ??o tuy?n ti?n li?t. Th? thu?t ny ??a m?t m c?y vo ?? ??y cc thy c?a tuy?n ti?n li?t ra xa ni?u ??o. Tun th? nh?ng h??ng d?n ny ? nh:  Ch? s? d?ng thu?c khng  k ??n v thu?c k ??n theo ch? d?n c?a chuyn gia ch?m [place] s?c kh?e.  Theo di cc tri?u ch?ng xem c b?t c? thay ??i no  khng. Hy trao ??i v?i chuyn gia ch?m [place] s?c kh?e n?u c b?t k? thay ??i no.  Trnh u?ng nhi?u n??c tr??c khi ?i ng? ho?c ra ngoi n?i cng c?ng.  Trnh u?ng ho?c gi?m l??ng caffeine ho?c gi?m l??ng r??u qu v? u?ng.  T? cho qu v? th?i gian khi ti?u ti?n.  Tun th? t?t c? cc l?n khm theo di theo ch? d?n c?a chuyn gia ch?m [place] s?c kh?e. ?i?u ny c vai tr quan tr?ng. Hy lin l?c v?i chuyn gia ch?m [place] s?c kh?e n?u:  Qu v? b? ?au l?ng khng r nguyn nhn.  Cc tri?u ch?ng c?a qu v? khng ?? h?n sau khi ???c ?i?u tr?[place]adell Heads v? b? nh?ng tc d?ng ph? c?a thu?c m qu v? ?ang dng.  N??c ti?u c?a qu v? tr? nn r?t s?m mu ho?c c mi hi.  Vng b?ng d??i c?a qu v? ch??ng ln v qu v? ?i ti?u kh kh?n. Yu c?u tr? gip ngay l?p t?c n?u:  Qu v? b? s?t ho?c ?n l?nh.  Qu v? ??t nhin khng th? ti?u ti?n.  Qu v? c?m th?y chong vng, ho?c r?t chng m?t, ho?c ng?t x?u.  C m?t l??ng l?n mu ho?c c?c mu ?ng trong n??c ti?u.  V?n ?? ti?u ti?n tr? ln kh qu?n l.  Qu v? b? ?au th?t l?ng ho?c ?au m?ng s??n t? m?c ?? trung bnh ??n m?c ?? n?ng. M?ng s??n l phn bn c? th? gi?a x??ng s??n v hng. Nh?ng tri?u ch?ng ny c th? l bi?u hi?n c?a m?t v?n ?? nghim tr?ng c?n c?p c?u. Khng ch? xem tri?u ch?ng c h?t khng. Hy ?i khm ngay l?p t?c. G?i cho d?ch v? c?p c?u t?i ??a ph??ng (911 ? [place] Khng t? li xe ??n b?nh vi?n. Tm t?t  T?ng s?n tuy?n ti?n li?t lnh tnh (BPH) l tuy?n ti?n li?t ph ??i do qu trnh lo ha bnh th??ng ch? khng ph?i do ung th? gy ra.  Tuy?n ti?n li?t ph ??i c th? chn p vo ni?u ??o. ?i?u ny c th? lm cho kh ?i ti?u.  Tnh tr?ng ny l m?t ph?n c?a qu trnh lo ha bnh th??ng v d? x?y ra h?n ? nam gi?i trn 50 tu?i.  Yu c?u tr? gip ngay l?p t?c n?u qu v? ??t nhin khng th? ti?u ti?n. Thng tin ny khng nh?m m?c ?ch thay th? cho l?i khuyn m chuyn gia ch?m [place] s?c kh?e ni v?i qu v?. Hy b?o ??m qu v? ph?i th?o  lu?n b?t k? v?n ?? g m qu v? c v?i chuyn gia ch?m [place] s?c kh?e c?a qu v?. Document Revised: 09/05/2018 Document Reviewed: 09/05/2018 [employer] Patient Education  2020 [employer].

## 2020-05-27 NOTE — Assessment & Plan Note (Signed)
Hemoglobin A1c higher than before at 7.0. Continue Metformin 1000 mg twice a day. Diet and nutrition discussed with patient. Continue statin therapy with rosuvastatin 10 mg daily. Follow-up in 6 months.

## 2020-05-28 LAB — LIPID PANEL
Chol/HDL Ratio: 2 ratio (ref 0.0–5.0)
Cholesterol, Total: 93 mg/dL — ABNORMAL LOW (ref 100–199)
HDL: 46 mg/dL (ref >39)
LDL Chol Calc (NIH): 29 mg/dL (ref 0–99)
Triglycerides: 94 mg/dL (ref 0–149)
VLDL Cholesterol Cal: 18 mg/dL (ref 5–40)

## 2020-05-28 LAB — MICROALBUMIN / CREATININE URINE RATIO
Creatinine, Urine: 54.9 mg/dL
Microalb/Creat Ratio: <5 mg/g creat (ref 0–29)
Microalbumin, Urine: <3 ug/mL

## 2020-05-28 LAB — CMP14+EGFR
ALT: 19 IU/L (ref 0–44)
AST: 16 IU/L (ref 0–40)
Albumin/Globulin Ratio: 1.6 (ref 1.2–2.2)
Albumin: 4 g/dL (ref 3.7–4.7)
Alkaline Phosphatase: 59 IU/L (ref 44–121)
BUN/Creatinine Ratio: 20 (ref 10–24)
BUN: 22 mg/dL (ref 8–27)
Bilirubin Total: 0.4 mg/dL (ref 0.0–1.2)
CO2: 26 mmol/L (ref 20–29)
Calcium: 9.3 mg/dL (ref 8.6–10.2)
Chloride: 105 mmol/L (ref 96–106)
Creatinine, Ser: 1.09 mg/dL (ref 0.76–1.27)
GFR calc Af Amer: 74 mL/min/{1.73_m2} (ref >59)
GFR calc non Af Amer: 64 mL/min/{1.73_m2} (ref >59)
Globulin, Total: 2.5 g/dL (ref 1.5–4.5)
Glucose: 162 mg/dL — ABNORMAL HIGH (ref 65–99)
Potassium: 4.2 mmol/L (ref 3.5–5.2)
Sodium: 143 mmol/L (ref 134–144)
Total Protein: 6.5 g/dL (ref 6.0–8.5)

## 2020-07-01 DIAGNOSIS — Z7984 Long term (current) use of oral hypoglycemic drugs: Secondary | ICD-10-CM | POA: Diagnosis not present

## 2020-07-01 DIAGNOSIS — N400 Enlarged prostate without lower urinary tract symptoms: Secondary | ICD-10-CM | POA: Diagnosis not present

## 2020-07-01 DIAGNOSIS — I10 Essential (primary) hypertension: Secondary | ICD-10-CM | POA: Diagnosis not present

## 2020-07-01 DIAGNOSIS — E785 Hyperlipidemia, unspecified: Secondary | ICD-10-CM | POA: Diagnosis not present

## 2020-07-01 DIAGNOSIS — E1165 Type 2 diabetes mellitus with hyperglycemia: Secondary | ICD-10-CM | POA: Diagnosis not present

## 2020-07-05 DIAGNOSIS — H2589 Other age-related cataract: Secondary | ICD-10-CM | POA: Diagnosis not present

## 2020-07-05 DIAGNOSIS — H25812 Combined forms of age-related cataract, left eye: Secondary | ICD-10-CM | POA: Diagnosis not present

## 2020-08-19 ENCOUNTER — Other Ambulatory Visit: Payer: Self-pay | Admitting: Emergency Medicine

## 2020-11-25 ENCOUNTER — Other Ambulatory Visit: Payer: Self-pay

## 2020-11-25 ENCOUNTER — Encounter: Payer: Self-pay | Admitting: Emergency Medicine

## 2020-11-25 ENCOUNTER — Ambulatory Visit (INDEPENDENT_AMBULATORY_CARE_PROVIDER_SITE_OTHER): Payer: Medicare Other | Admitting: Emergency Medicine

## 2020-11-25 VITALS — BP 133/72 | HR 71 | Temp 98.8°F | Resp 16 | Ht 65.0 in | Wt 123.0 lb

## 2020-11-25 DIAGNOSIS — E785 Hyperlipidemia, unspecified: Secondary | ICD-10-CM

## 2020-11-25 DIAGNOSIS — E1159 Type 2 diabetes mellitus with other circulatory complications: Secondary | ICD-10-CM | POA: Diagnosis not present

## 2020-11-25 DIAGNOSIS — I152 Hypertension secondary to endocrine disorders: Secondary | ICD-10-CM | POA: Diagnosis not present

## 2020-11-25 DIAGNOSIS — E1169 Type 2 diabetes mellitus with other specified complication: Secondary | ICD-10-CM | POA: Diagnosis not present

## 2020-11-25 DIAGNOSIS — R399 Unspecified symptoms and signs involving the genitourinary system: Secondary | ICD-10-CM | POA: Diagnosis not present

## 2020-11-25 LAB — POCT GLYCOSYLATED HEMOGLOBIN (HGB A1C): Hemoglobin A1C: 7.1 % — AB (ref 4.0–5.6)

## 2020-11-25 LAB — GLUCOSE, POCT (MANUAL RESULT ENTRY): POC Glucose: 220 mg/dl — AB (ref 70–99)

## 2020-11-25 NOTE — Progress Notes (Signed)
Erik Aguirre 80 y.o.   Chief Complaint  Patient presents with  . Diabetes    Follow up 6 month    HISTORY OF PRESENT ILLNESS: This is a 80 y.o. male with history of diabetes here for 44-monthfollow-up.  On Metformin 1000 mg twice a day. Also has history of BPH with lower urinary tract symptoms, on Cardura 3 mg daily.  Doing well.  Has no complaints or medical concerns today.  VGuinea-Bissauinterpreter used for this interview.  HPI   Prior to Admission medications   Medication Sig Start Date End Date Taking? Authorizing Provider  metFORMIN (GLUCOPHAGE) 1000 MG tablet TAKE 1 TABLET BY MOUTH TWICE DAILY WITH A MEAL 08/19/20  Yes Jazper Nikolai, MInes Bloomer MD  Multiple Vitamin (MULTIVITAMIN) capsule Take 1 capsule by mouth daily. Reported on 01/15/2016   Yes [provider]  rosuvastatin (CRESTOR) 10 MG tablet Take 1 tablet (10 mg total) by mouth daily. 11/27/19  Yes Beau Ramsburg, MInes Bloomer MD  doxazosin (CARDURA) 2 MG tablet Take 1.5 tablets (3 mg total) by mouth daily. 05/27/20 08/25/20  SHorald Pollen MD  ketorolac (ACULAR) 0.4 % SOLN Place 1 drop into the left eye 4 (four) times daily. Patient not taking: Reported on 11/25/2020 05/23/20   [provider]  ofloxacin (OCUFLOX) 0.3 % ophthalmic solution Place 1 drop into the left eye 4 (four) times daily. Patient not taking: Reported on 11/25/2020 05/23/20   [provider]  prednisoLONE acetate (PRED FORTE) 1 % ophthalmic suspension Place 1 drop into the left eye 4 (four) times daily. Patient not taking: Reported on 11/25/2020 05/23/20   [provider]    Allergies  Allergen Reactions  . Lisinopril Cough  . 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 Dyslipidemia associated with type 2 diabetes mellitus ([place] 05/27/2020  . Dyslipidemia 11/01/2014  . Hypertension associated with diabetes ([place]  07/02/2014  . Diabetes mellitus with no complication ([facility] 171/[id number] . 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 Benign prostatic hyperplasia (BPH) with urinary urgency 03/17/2013  . BPH (benign prostatic hyperplasia) 03/17/2013    Past Medical History:  Diagnosis Date  . Acute appendicitis s/p alp appy 11/27/2014 11/26/2014  . BPH (benign prostatic hyperplasia)   . Hyperlipidemia   . Hypertension   . 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 LAPAROSCOPIC APPENDECTOMY N/A 11/27/2014   Procedure: APPENDECTOMY LAPAROSCOPIC ;  Surgeon: SMichael Boston MD;  Location: [facility];  Service: General;  Laterality: N/A;    Social History   Socioeconomic History  . Marital status: Married    Spouse name: Not on file  . Number of children: 5  . Years of education: Not on file  . Highest education level: Not on file  Occupational History  . Occupation: Retired  Tobacco Use  . Smoking status: Never Smoker  . Smokeless tobacco: Never Used  Vaping Use  . Vaping Use: Never used  Substance and Sexual Activity  . Alcohol use: Yes    Comment: occ  . Drug use: No  . Sexual activity: Not on file  Other Topics Concern  . Not on file  Social History Narrative   [place]   Language of English okay   Social Determinants of Health   Financial Resource Strain: Not on file  Food Insecurity:  Not on file  Transportation Needs: Not on file  Physical Activity: Not on file  Stress: Not on file  Social Connections: Not on file  Intimate Partner Violence: Not on file    Family History  Problem Relation Age of Onset  . Throat cancer Brother   . Asthma Son      Review of Systems  Constitutional: Negative.  Negative for chills and fever.  HENT: Negative.  Negative for congestion and sore throat.   Respiratory: Negative.  Negative for cough and shortness of breath.   Cardiovascular: Negative.  Negative for  chest pain and palpitations.  Gastrointestinal: Negative.  Negative for abdominal pain, blood in stool, diarrhea, melena, nausea and vomiting.  Genitourinary: Negative.  Negative for dysuria and hematuria.  Skin: Negative.   Neurological: Negative.  Negative for dizziness and headaches.  All other systems reviewed and are negative.   Vitals:   11/25/20 1357  BP: 133/72  Pulse: 71  Resp: 16  Temp: 98.8 F (37.1 C)  SpO2: 95%   Wt Readings from Last 3 Encounters:  11/25/20 123 lb (55.8 kg)  05/27/20 120 lb 6.4 oz (54.6 kg)  03/26/20 122 lb (55.3 kg)    Physical Exam Vitals reviewed.  Constitutional:      Appearance: Normal appearance.  HENT:     Head: Normocephalic.  Eyes:     Extraocular Movements: Extraocular movements intact.     Conjunctiva/sclera: Conjunctivae normal.     Pupils: Pupils are equal, round, and reactive to light.  Cardiovascular:     Rate and Rhythm: Normal rate and regular rhythm.     Pulses: Normal pulses.     Heart sounds: Normal heart sounds.  Pulmonary:     Effort: Pulmonary effort is normal.     Breath sounds: Normal breath sounds.  Abdominal:     General: There is no distension.     Palpations: Abdomen is soft.     Tenderness: There is no abdominal tenderness.  Musculoskeletal:        General: Normal range of motion.     Cervical back: Normal range of motion and neck supple.  Skin:    General: Skin is warm and dry.     Capillary Refill: Capillary refill takes less than 2 seconds.  Neurological:     General: No focal deficit present.     Mental Status: He is alert and oriented to person, place, and time.  Psychiatric:        Mood and Affect: Mood normal.        Behavior: Behavior normal.     Results for orders placed or performed in visit on 11/25/20 (from the past 24 hour(s))  POCT glucose (manual entry)     Status: Abnormal   Collection Time: 11/25/20  2:06 PM  Result Value Ref Range   POC Glucose 220 (A) 70 - 99 mg/dl  POCT  glycosylated hemoglobin (Hb A1C)     Status: Abnormal   Collection Time: 11/25/20  2:10 PM  Result Value Ref Range   Hemoglobin A1C 7.1 (A) 4.0 - 5.6 %   HbA1c POC (<> result, manual entry)     HbA1c, POC (prediabetic range)     HbA1c, POC (controlled diabetic range)     A total of 30 minutes was spent with the patient, greater than 50% of which was in counseling/coordination of care regarding diabetes and dyslipidemia and cardiovascular risks associated with these conditions, review of all medications, education on nutrition, review of most  recent blood work results including today's hemoglobin A1c, review of most recent office visit notes, prognosis, documentation, need for follow-up.  ASSESSMENT & PLAN: Hypertension associated with diabetes ([place]) Well-controlled hypertension. Well-controlled diabetes with hemoglobin A1c at an acceptable level 7.1. Continue Metformin 1000 mg twice a day. Continue rosuvastatin 10 mg once a day. Diet and nutrition discussed. Follow-up in 6 months.  Milbern was seen today for diabetes.  Diagnoses and all orders for this visit:  Hypertension associated with diabetes ([place]) -     POCT glucose (manual entry) -     POCT glycosylated hemoglobin (Hb A1C) -     CMP14+EGFR -     HM Diabetes Foot Exam  Dyslipidemia associated with type 2 diabetes mellitus ([place]) -     Lipid panel  Lower urinary tract symptoms (LUTS)    Patient Instructions       If you have lab work done today you will be contacted with your lab results within the next 2 weeks.  If you have not heard from [place] then please contact us. The fastest way to get your results is to register for My Chart.   IF you received an x-ray today, you will receive an invoice from [facility]. Please contact [facility] at [phone] with questions or concerns regarding your invoice.   IF you received labwork today, you will receive an invoice from [place]. Please contact [facility] at  [phone] with questions or concerns regarding your invoice.   Our billing staff will not be able to assist you with questions regarding bills from these companies.  You will be contacted with the lab results as soon as they are available. The fastest way to get your results is to activate your My Chart account. Instructions are located on the last page of this paperwork. If you have not heard from [place] regarding the results in 2 weeks, please contact this office.     B?nh ti?u ???ng v dinh d??ng, ng??i l?n Diabetes Mellitus and Nutrition, Adult Khi qu v? b? ti?u ???ng hay b?nh ti?u ???ng, ?i?u r?t quan tr?ng l ph?i c thi quen ?n u?ng t?t cho s?c kh?e b?i v nh?ng g qu v? ?n v u?ng ?nh h??ng l?n ??n n?ng ?? ???ng huy?t (glucose) c?a qu v?. ?n th?c ph?m t?t cho s?c kh?e v?i s? l??ng v?a ph?i, vo cng cc th?i ?i?m m?i ngy, c th? gip qu v?:  Ki?m sot ???ng huy?t.  Gi?m nguy c? b? b?nh tim.  C?i thi?n huy?t p.  ??t ???c ho?c duy tr m??c cn n?ng c l?i cho s?c kh?e. ?i?u g c th? ?nh h??ng ??n k? ho?ch ?n u?ng c?a ti? M?i ng??i b? ti?u ???ng khc nhau v m?i ng??i ??u c nhu c?u v? ch??ng trnh ?n u?ng khc nhau. Chuyn gia ch?m [place] s?c kh?e c?a qu v? c th? [place] qu v? trao ??i v?i chuyn gia dinh d??ng ?? xy d?ng ch??ng trnh ?n u?ng t?t nh?t cho qu v?. Ch??ng trnh ?n u?ng c?a qu v? c th? thay ??i ty thu?c vo cc y?u t? nh?:  L??ng calo quy? vi? c?n.  Cc lo?i thu?c m quy? vi? du?ng.  Cn n??ng cu?a quy? vi?.  N?ng ?? ???ng huy?t, huy?t p v cholesterol c?a qu v?.  M?c ?? ho?t ??ng c?a qu v?.  Cc tnh tr?ng s?c kh?e khc m qu v? c, ch?ng h?n nh? b?nh tim ho?c b?nh th?n. Carbohydrate ?nh h??ng nh? th? no ??n ti? Carbohydrate, hay cn  g?i l carb, ?nh h??ng ??n l??ng ???ng huy?t c?a quy? vi? nhi?u h?n b?t k? lo?i th?c ph?m no khc. ?n carb theo cch t? nhin lm t?ng l??ng glucose trong mu. Ti?nh l???ng carb l m?t ph??ng php theo di l??ng  carb m quy? vi? ?n. Ti?nh l???ng carb c vai tr quan tr?ng trong vi?c gi? cho ???ng huy?t cu?a quy? vi? ? m?c c l?i cho s?c kh?e, ??c bi?t l n?u quy? vi? du?ng insulin ho?c u?ng m?t s? lo?i thu?c nh?t ??nh ?i?u tri? ti?u ???ng. Bi?t l??ng carb m qu v? c th? ?n m?t cch an ton trong m?i b?a ?n c vai tr quan tr?ng. L??ng carbohydrate ny khc nhau v?i m?i ng??i. Chuyn gia dinh d??ng c?a qu v? c th? gip tnh ton l??ng carb m qu v? c?n ph?i ?n vo m?i b?a ?n chnh v m?i b?a ?n nh?. R??u ?nh h??ng ??n ti nh? th? no? R??u c th? gy gi?m ???ng huy?t (h? ???ng huy?t) ??t ng?t, ??c bi?t l n?u qu v? s? d?ng insulin ho?c u?ng m?t s? lo?i thu?c nh?t ??nh ?i?u tr? ti?u ???ng. H? ???ng huy?t c th? l m?t tnh tr?ng ?e d?a tnh ma?ng. Cc tri?u ch?ng c?a h? ???ng huy?t, ch?ng h?n bu?n ng?, chng m?t v l l?n, t??ng t? cc tri?u ch?ng c?a vi?c u?ng qu nhi?u r??u.  Khng u?ng r??u n?u: ? Chuyn gia ch?m [place] s?c kh?e khuyn qu v? khng u?ng r??u. ? Qu v? c [place], c th? c [place], ho?c c k? ho?ch c [place].  N?u qu v? u?ng r??u: ? Khng u?ng r???u khi ?o?i. ? Gi?i h?n l??ng r??u qu v? u?ng ? m?c:  0-1 ly/ngy ??i v?i n? gi?i.  0-2 ly/ngy ??i v?i nam gi?i. ? Bi?t r m?t ly c bao nhiu r??u. ? M?, m?t ly t??ng ???ng v?i m?t chai bia 12 ao x? (355 mL), m?t ly r??u vang 5 ao x? (148 mL), ho?c m?t ly r??u m?nh 1 ao x? (44 mL). ? Lun b ?? n??c b?ng n??c, soda dnh cho ng??i ?n king, ho?c tr ? khng ???ng.  Orlene Plum nh? r?ng soda thng th???ng, n??c tri cy v ca?c ?? u?ng h?n h??p khc c th? ch?a nhi?u ???ng v ph?i ???c tnh l carb. C nh?ng l?i khuyn no ?? tun th? k? ho?ch ny? ??c nhn th?c ph?m  B?t ??u b?ng vi?c ki?m tra c? kh?u ph?n trn nhn "Thng tin dinh d??ng" c?a th?c ph?m v ?? u?ng ?ng gi. L??ng calo, carb, ch?t bo v cc ch?t dinh d??ng khc ghi trn nhn d?a trn m?t kh?u ph?n c?a mn ?. Nhi?u mn ch?a nhi?u h?n m?t kh?u ph?n trn m?i bao b.  Ki?m tra  t?ng s? gam (g) carb trong m?t kh?u ph?n. Qu v? c th? tnh s? kh?u ph?n carb trong m?t kh?u ph?n b?ng cch chia t?ng carb cho 15. V d?: n?u m?t th?c ph?m ch?a t?ng 30 g carb m?i kh?u ph?n, n s? t??ng ???ng v?i 2 kh?u ph?n carb.  Ki?m tra s? gam (g) ch?t bo bo ha v ch?t bo chuy?n ha trong m?t kh?u ph?n. Ch?n th?c ph?m c t ho?c khng c nh?ng ch?t bo ny.  Ki?m tra s? miligam (mg) mu?i (natri) trong m?t kh?u ph?n. H?u h?t m?i ng??i ??u nn gi?i h?n t?ng natri tiu thu? ?? m??c d??i 2.300 mg m?i ngy.  Lun ki?m tra thng tin dinh d??ng c?a th?c ph?m ghi trn nhn l "t bo"  hay "khng bo". Nh?ng th?c ph?m ny c th? c b? sung ???ng ho?c carb tinh luy?n cao h?n v c?n ph?i trnh.  Hy trao ??i v?i chuyn gia dinh d??ng c?a qu v? ?? xc ??nh m?c tiu hng ngy c?a qu v? ??i v?i cc ch?t dinh d??ng li?t k trn nhn. Mua s?m  Trnh mua nh?ng th?c ph?m ?ng h?p, lm s?n ho?c ch? bi?n s?n. Nh?ng th?c ph?m ny c xu h??ng c nhi?u ch?t bo, natri, b? sung ???ng.  Mua s?m quanh ra ngoi c?a c?a hng t?p ha. ?y l n?i th??ng t?p trung tri cy v rau c? t??i, nhi?u lo?i h?t, th?t t??i v s?a t??i. N?u n??ng  S? d?ng ca?c ph??ng php n?u ?n nhi?t ?? th?p, ch?ng h?n nh? n??ng, thay v ph??ng php n?u nhi?t ?? cao nh? chin ng?p d?u m??.  N?u ?n b?ng cch s? d?ng cc lo?i d?u t?t cho s?c kh?e, ch?ng h?n nh? d?u  liu, d?u h?t c?i d?u ho?c d?u h??ng d??ng.  Hessie Diener n?u v?i b?, kem, ho?c th?t nhi?u m?. Ln k? ho?ch cho b?a ?n  ?n cc b?a ?n chnh v cc b?a ?n nh? ??u ??n, t?t nh?t l vo cng m?t th?i ?i?m m?i ngy. Trnh nhi?n ?n trong th?i [employer].  ?n th?c ?n giu ch?t x? nh? cc lo?i tri cy t??i, rau c?, ??u v ng? c?c nguyn cm. Trao ??i v?i chuyn khoa dinh d??ng xem qu v? c th? ?n bao nhiu kh?u ph?n carb m?i b?a ?n.  ?n 4-6 ao-x? (oz) (112-168 g) protein n?c m?i ngy, ch?ng h?n nh? th?t n?c, th?t g, c, tr?ng, ho?c ??u ph?. M?t ao-x? (oz) protein n?c b?ng: ? 1 ao-x?  (oz) (28 g) th?t, th?t g ho?c c. ? 1 qu? tr?ng. ? 1?4 c?c (62 g) ??u ph?.  ?n m?t s? th?c ph?m ch?a cc ch?t bo lnh m?nh m?i ngy, ch?ng h?n nh? qu? b?, qu? h?ch, cc lo?i h?t v c.   Ti nn ?n nh?ng th?c ph?m no? Tri cy Qu? m?ng. To. Tyler Pita? ?o. Qu? m?. Qu? m?n. Nho. Qu? xoi. Qu? ?u ??Sander Nephew? l?u. Qu? kiwi. Qu? anh ?o. Marlou Starks c? Rau di?p. Rau chn v?t. Toney Reil, bao g?m c?i xo?n, c?i c?u v?ng, c?i r? v c? b?. C? c?i ???ng. Sp l?. C?i b?p. Bng c?i xanh. C r?t. ??u xanh. C chua. ?t ng?t. Hnh ty. D?a chu?t. C?i bruxen. Ng? c?c Ng? c?c nguyn ca?m nh? bnh m nguyn ca?m ho??c nguyn h?t, bnh quy gin, bnh m ng, ng? c?c v m ?ng. B?t y?n m?ch khng ???ng. H?t dim m?ch (quinoa). G?o d?i ho?c g?o khng xt. Th?t v cc protein khc H?i s?n. Th?t gia c?m khng da. Mi?ng n?c th?t gia c?m v th?t b. ??u ph?. Qu? h?ch. Cc lo?i h?t. S?a Cc s?n ph?m s??a khng co? ho??c co? i?t ch?t be?o, ch?ng h?n nh? s?a, s??a chua va? pho mt. Nh?ng th?c ph?m li?t k ? trn c th? khng ph?i l m?t danh m?c ??y ?? cc lo?i th?c ph?m v ?? u?ng m qu v? c th? ?n. Lin h? v?i chuyn gia dinh d??ng ?? c thm thng tin. Ti nn trnh nh?ng th?c ph?m no? Tri cy Tri cy ?ng h?p xi-r. Marlou Starks c? Marlou Starks c? ?ng h?p. Rau ?ng l?nh v?i b? ho?c n??c s?t kem. Ng? c?c Cc s?n ph?m lm t? b?t m tr?ng v b?t m ???c tinh ch? nh? bnh m, m ?ng, ?? ?  n nh? v ng? c?c. Trnh t?t c? cc th?c ph?m ch? bi?n s?n. Th?t v cc protein khc Cc la?t thi?t m??. Th?t gia c?m c da. Th?t chin/rn ho?c t?m b?t rn. Th?t ch? bi?n s?n. Trnh cc ch?t bo bo ha. S?a S?a chua nguyn ch?t bo, pho mt ho?c s?a. ?? u?ng ?? u?ng c ???ng, ch?ng h?n nh? soda ho?c tr ?Marland Kitchen Nh?ng th?c ph?m li?t k ? trn c th? khng ph?i l m?t danh m?c ??y ?? cc lo?i th?c ph?m v ?? u?ng qu v? nn trnh. Lin h? v?i chuyn gia dinh d??ng ?? c thm thng tin. Ca?c cu h?i c?n ??a ra v?i chuyn gia ch?m [place] s?c kh?e  Ti co?  c?n g??p m?t chuyn gia h???ng d?n v? b?nh ti?u ????ng khng?  Ti c c?n g?p chuyn gia dinh d??ng khng?  Ti co? th? go?i cho s? na?o n?u ti co? th?c m?c?  Khi no l th?i ?i?m ki?m tra ???ng huy?t t?t nh?t? N?i ?? tm thm thng tin:  American Diabetes Association (Hi?p h?i Ti?u ????ng Ho[place] K?): diabetes.org  Academy of Nutrition and Dietetics ([place] d??ng v Ch? ?? ?n): www.eatright.[employer] of Diabetes and Digestive and Kidney Diseases (Vi?n Ti?u ???ng v cc B?nh Tiu ha v B?nh Th?n Qu?c gia): [place].dk  Association of Diabetes Care and Education Specialists (Hi?p h?i Chuyn gia Gio d?c Ch?m [place] B?nh ti?u ???ng): www.diabeteseducator.org Tm t?t  ?i?u r?t quan tr?ng l ph?i c thi quen ?n u?ng t?t cho s?c kh?e b?i v nh?ng g qu v? ?n v u?ng ?nh h??ng l?n ??n n?ng ?? ???ng huy?t (glucose) c?a qu v?.  Ch??ng trnh b?a ?n t?t cho s?c kh?e s? gip qu v? ki?m sot ???ng huy?t v duy tr l?i s?ng lnh m?nh.  Chuyn gia ch?m [place] s?c kh?e c?a qu v? c th? [place] qu v? trao ??i v?i chuyn gia dinh d??ng ?? xy d?ng ch??ng trnh ?n u?ng t?t nh?t cho qu v?.  Lun nh? r?ng carbohydrate (carb) v r??u c tc ??ng ngay l?p t?c ??n n?ng ?? ???ng huy?t c?a qu v?. ?i?u quan tr?ng l ph?i tnh carb v s? d?ng r??u m?t cch th?n tr?ng. Thng tin ny khng nh?m m?c ?ch thay th? cho l?i khuyn m chuyn gia ch?m [place] s?c kh?e ni v?i qu v?. Hy b?o ??m qu v? ph?i th?o lu?n b?t k? v?n ?? g m qu v? c v?i chuyn gia ch?m [place] s?c kh?e c?a qu v?. Document Revised: 09/26/2019 Document Reviewed: 09/26/2019 Elsevier Patient Education  2021 Elsevier Inc.      Agustina Caroli, MD [facility]

## 2020-11-25 NOTE — Patient Instructions (Addendum)
   If you have lab work done today you will be contacted with your lab results within the next 2 weeks.  If you have not heard from us then please contact us. The fastest way to get your results is to register for My Chart.   IF you received an x-ray today, you will receive an invoice from [facility]. Please contact [facility] at [phone] with questions or concerns regarding your invoice.   IF you received labwork today, you will receive an invoice from [facility]. Please contact [facility] at [phone] with questions or concerns regarding your invoice.   Our billing staff will not be able to assist you with questions regarding bills from these companies.  You will be contacted with the lab results as soon as they are available. The fastest way to get your results is to activate your My Chart account. Instructions are located on the last page of this paperwork. If you have not heard from us regarding the results in 2 weeks, please contact this office.     B?nh ti?u ???ng v dinh d??ng, ng??i l?n Diabetes Mellitus and Nutrition, Adult Khi qu v? b? ti?u ???ng hay b?nh ti?u ???ng, ?i?u r?t quan tr?ng l ph?i c thi quen ?n u?ng t?t cho s?c kh?e b?i v nh?ng g qu v? ?n v u?ng ?nh h??ng l?n ??n n?ng ?? ???ng huy?t (glucose) c?a qu v?. ?n th?c ph?m t?t cho s?c kh?e v?i s? l??ng v?a ph?i, vo cng cc th?i ?i?m m?i ngy, c th? gip qu v?:  Ki?m sot ???ng huy?t.  Gi?m nguy c? b? b?nh tim.  C?i thi?n huy?t p.  ??t ???c ho?c duy tr m??c cn n?ng c l?i cho s?c kh?e. ?i?u g c th? ?nh h??ng ??n k? ho?ch ?n u?ng c?a ti? M?i ng??i b? ti?u ???ng khc nhau v m?i ng??i ??u c nhu c?u v? ch??ng trnh ?n u?ng khc nhau. Chuyn gia ch?m sc s?c kh?e c?a qu v? c th? khuyn qu v? trao ??i v?i chuyn gia dinh d??ng ?? xy d?ng ch??ng trnh ?n u?ng t?t nh?t cho qu v?. Ch??ng trnh ?n u?ng c?a qu v? c th? thay ??i ty thu?c vo cc y?u t? nh?:  L??ng calo quy? vi?  c?n.  Cc lo?i thu?c m quy? vi? du?ng.  Cn n??ng cu?a quy? vi?.  N?ng ?? ???ng huy?t, huy?t p v cholesterol c?a qu v?.  M?c ?? ho?t ??ng c?a qu v?.  Cc tnh tr?ng s?c kh?e khc m qu v? c, ch?ng h?n nh? b?nh tim ho?c b?nh th?n. Carbohydrate ?nh h??ng nh? th? no ??n ti? Carbohydrate, hay cn g?i l carb, ?nh h??ng ??n l??ng ???ng huy?t c?a quy? vi? nhi?u h?n b?t k? lo?i th?c ph?m no khc. ?n carb theo cch t? nhin lm t?ng l??ng glucose trong mu. Ti?nh l???ng carb l m?t ph??ng php theo di l??ng carb m quy? vi? ?n. Ti?nh l???ng carb c vai tr quan tr?ng trong vi?c gi? cho ???ng huy?t cu?a quy? vi? ? m?c c l?i cho s?c kh?e, ??c bi?t l n?u quy? vi? du?ng insulin ho?c u?ng m?t s? lo?i thu?c nh?t ??nh ?i?u tri? ti?u ???ng. Bi?t l??ng carb m qu v? c th? ?n m?t cch an ton trong m?i b?a ?n c vai tr quan tr?ng. L??ng carbohydrate ny khc nhau v?i m?i ng??i. Chuyn gia dinh d??ng c?a qu v? c th? gip tnh ton l??ng carb m qu v? c?n ph?i ?n vo m?i b?a ?n chnh v   m?i b?a ?n nh?. R??u ?nh h??ng ??n ti nh? th? no? R??u c th? gy gi?m ???ng huy?t (h? ???ng huy?t) ??t ng?t, ??c bi?t l n?u qu v? s? d?ng insulin ho?c u?ng m?t s? lo?i thu?c nh?t ??nh ?i?u tr? ti?u ???ng. H? ???ng huy?t c th? l m?t tnh tr?ng ?e d?a tnh ma?ng. Cc tri?u ch?ng c?a h? ???ng huy?t, ch?ng h?n bu?n ng?, chng m?t v l l?n, t??ng t? cc tri?u ch?ng c?a vi?c u?ng qu nhi?u r??u.  Khng u?ng r??u n?u: ? Chuyn gia ch?m sc s?c kh?e khuyn qu v? khng u?ng r??u. ? Qu v? c thai, c th? c thai, ho?c c k? ho?ch c thai.  N?u qu v? u?ng r??u: ? Khng u?ng r???u khi ?o?i. ? Gi?i h?n l??ng r??u qu v? u?ng ? m?c:  0-1 ly/ngy ??i v?i n? gi?i.  0-2 ly/ngy ??i v?i nam gi?i. ? Bi?t r m?t ly c bao nhiu r??u. ? M?, m?t ly t??ng ???ng v?i m?t chai bia 12 ao x? (355 mL), m?t ly r??u vang 5 ao x? (148 mL), ho?c m?t ly r??u m?nh 1 ao x? (44 mL). ? Lun b ?? n??c b?ng n??c, soda dnh cho  ng??i ?n king, ho?c tr ? khng ???ng.  Lun nh? r?ng soda thng th???ng, n??c tri cy v ca?c ?? u?ng h?n h??p khc c th? ch?a nhi?u ???ng v ph?i ???c tnh l carb. C nh?ng l?i khuyn no ?? tun th? k? ho?ch ny? ??c nhn th?c ph?m  B?t ??u b?ng vi?c ki?m tra c? kh?u ph?n trn nhn "Thng tin dinh d??ng" c?a th?c ph?m v ?? u?ng ?ng gi. L??ng calo, carb, ch?t bo v cc ch?t dinh d??ng khc ghi trn nhn d?a trn m?t kh?u ph?n c?a mn ?. Nhi?u mn ch?a nhi?u h?n m?t kh?u ph?n trn m?i bao b.  Ki?m tra t?ng s? gam (g) carb trong m?t kh?u ph?n. Qu v? c th? tnh s? kh?u ph?n carb trong m?t kh?u ph?n b?ng cch chia t?ng carb cho 15. V d?: n?u m?t th?c ph?m ch?a t?ng 30 g carb m?i kh?u ph?n, n s? t??ng ???ng v?i 2 kh?u ph?n carb.  Ki?m tra s? gam (g) ch?t bo bo ha v ch?t bo chuy?n ha trong m?t kh?u ph?n. Ch?n th?c ph?m c t ho?c khng c nh?ng ch?t bo ny.  Ki?m tra s? miligam (mg) mu?i (natri) trong m?t kh?u ph?n. H?u h?t m?i ng??i ??u nn gi?i h?n t?ng natri tiu thu? ?? m??c d??i 2.300 mg m?i ngy.  Lun ki?m tra thng tin dinh d??ng c?a th?c ph?m ghi trn nhn l "t bo" hay "khng bo". Nh?ng th?c ph?m ny c th? c b? sung ???ng ho?c carb tinh luy?n cao h?n v c?n ph?i trnh.  Hy trao ??i v?i chuyn gia dinh d??ng c?a qu v? ?? xc ??nh m?c tiu hng ngy c?a qu v? ??i v?i cc ch?t dinh d??ng li?t k trn nhn. Mua s?m  Trnh mua nh?ng th?c ph?m ?ng h?p, lm s?n ho?c ch? bi?n s?n. Nh?ng th?c ph?m ny c xu h??ng c nhi?u ch?t bo, natri, b? sung ???ng.  Mua s?m quanh ra ngoi c?a c?a hng t?p ha. ?y l n?i th??ng t?p trung tri cy v rau c? t??i, nhi?u lo?i h?t, th?t t??i v s?a t??i. N?u n??ng  S? d?ng ca?c ph??ng php n?u ?n nhi?t ?? th?p, ch?ng h?n nh? n??ng, thay v ph??ng php n?u nhi?t ?? cao nh? chin ng?p d?u m??.  N?u ?  n b?ng cch s? d?ng cc lo?i d?u t?t cho s?c kh?e, ch?ng h?n nh? d?u  liu, d?u h?t c?i d?u ho?c d?u h??ng d??ng.  Trnh n?u  v?i b?, kem, ho?c th?t nhi?u m?. Ln k? ho?ch cho b?a ?n  ?n cc b?a ?n chnh v cc b?a ?n nh? ??u ??n, t?t nh?t l vo cng m?t th?i ?i?m m?i ngy. Trnh nhi?n ?n trong th?i gian di.  ?n th?c ?n giu ch?t x? nh? cc lo?i tri cy t??i, rau c?, ??u v ng? c?c nguyn cm. Trao ??i v?i chuyn khoa dinh d??ng xem qu v? c th? ?n bao nhiu kh?u ph?n carb m?i b?a ?n.  ?n 4-6 ao-x? (oz) (112-168 g) protein n?c m?i ngy, ch?ng h?n nh? th?t n?c, th?t g, c, tr?ng, ho?c ??u ph?. M?t ao-x? (oz) protein n?c b?ng: ? 1 ao-x? (oz) (28 g) th?t, th?t g ho?c c. ? 1 qu? tr?ng. ? 1?4 c?c (62 g) ??u ph?.  ?n m?t s? th?c ph?m ch?a cc ch?t bo lnh m?nh m?i ngy, ch?ng h?n nh? qu? b?, qu? h?ch, cc lo?i h?t v c.   Ti nn ?n nh?ng th?c ph?m no? Tri cy Qu? m?ng. To. Cam. Qu? ?o. Qu? m?. Qu? m?n. Nho. Qu? xoi. Qu? ?u ??. Qu? l?u. Qu? kiwi. Qu? anh ?o. Rau c? Rau di?p. Rau chn v?t. Rau xanh, bao g?m c?i xo?n, c?i c?u v?ng, c?i r? v c? b?. C? c?i ???ng. Sp l?. C?i b?p. Bng c?i xanh. C r?t. ??u xanh. C chua. ?t ng?t. Hnh ty. D?a chu?t. C?i bruxen. Ng? c?c Ng? c?c nguyn ca?m nh? bnh m nguyn ca?m ho??c nguyn h?t, bnh quy gin, bnh m ng, ng? c?c v m ?ng. B?t y?n m?ch khng ???ng. H?t dim m?ch (quinoa). G?o d?i ho?c g?o khng xt. Th?t v cc protein khc H?i s?n. Th?t gia c?m khng da. Mi?ng n?c th?t gia c?m v th?t b. ??u ph?. Qu? h?ch. Cc lo?i h?t. S?a Cc s?n ph?m s??a khng co? ho??c co? i?t ch?t be?o, ch?ng h?n nh? s?a, s??a chua va? pho mt. Nh?ng th?c ph?m li?t k ? trn c th? khng ph?i l m?t danh m?c ??y ?? cc lo?i th?c ph?m v ?? u?ng m qu v? c th? ?n. Lin h? v?i chuyn gia dinh d??ng ?? c thm thng tin. Ti nn trnh nh?ng th?c ph?m no? Tri cy Tri cy ?ng h?p xi-r. Rau c? Rau c? ?ng h?p. Rau ?ng l?nh v?i b? ho?c n??c s?t kem. Ng? c?c Cc s?n ph?m lm t? b?t m tr?ng v b?t m ???c tinh ch? nh? bnh m, m ?ng, ?? ?n nh? v ng? c?c. Trnh t?t c? cc  th?c ph?m ch? bi?n s?n. Th?t v cc protein khc Cc la?t thi?t m??. Th?t gia c?m c da. Th?t chin/rn ho?c t?m b?t rn. Th?t ch? bi?n s?n. Trnh cc ch?t bo bo ha. S?a S?a chua nguyn ch?t bo, pho mt ho?c s?a. ?? u?ng ?? u?ng c ???ng, ch?ng h?n nh? soda ho?c tr ?. Nh?ng th?c ph?m li?t k ? trn c th? khng ph?i l m?t danh m?c ??y ?? cc lo?i th?c ph?m v ?? u?ng qu v? nn trnh. Lin h? v?i chuyn gia dinh d??ng ?? c thm thng tin. Ca?c cu h?i c?n ??a ra v?i chuyn gia ch?m sc s?c kh?e  Ti co? c?n g??p m?t chuyn gia h???ng d?n v? b?nh ti?u ????ng khng?  Ti c c?n g?p chuyn gia dinh d??ng khng?  Ti   co? th? go?i cho s? na?o n?u ti co? th?c m?c?  Khi no l th?i ?i?m ki?m tra ???ng huy?t t?t nh?t? N?i ?? tm thm thng tin:  American Diabetes Association (Hi?p h?i Ti?u ????ng Hoa K?): diabetes.org  Academy of Nutrition and Dietetics (H?c vi?n Dinh d??ng v Ch? ?? ?n): www.eatright.org  National Institute of Diabetes and Digestive and Kidney Diseases (Vi?n Ti?u ???ng v cc B?nh Tiu ha v B?nh Th?n Qu?c gia): www.niddk.nih.gov  Association of Diabetes Care and Education Specialists (Hi?p h?i Chuyn gia Gio d?c Ch?m sc B?nh ti?u ???ng): www.diabeteseducator.org Tm t?t  ?i?u r?t quan tr?ng l ph?i c thi quen ?n u?ng t?t cho s?c kh?e b?i v nh?ng g qu v? ?n v u?ng ?nh h??ng l?n ??n n?ng ?? ???ng huy?t (glucose) c?a qu v?.  Ch??ng trnh b?a ?n t?t cho s?c kh?e s? gip qu v? ki?m sot ???ng huy?t v duy tr l?i s?ng lnh m?nh.  Chuyn gia ch?m sc s?c kh?e c?a qu v? c th? khuyn qu v? trao ??i v?i chuyn gia dinh d??ng ?? xy d?ng ch??ng trnh ?n u?ng t?t nh?t cho qu v?.  Lun nh? r?ng carbohydrate (carb) v r??u c tc ??ng ngay l?p t?c ??n n?ng ?? ???ng huy?t c?a qu v?. ?i?u quan tr?ng l ph?i tnh carb v s? d?ng r??u m?t cch th?n tr?ng. Thng tin ny khng nh?m m?c ?ch thay th? cho l?i khuyn m chuyn gia ch?m sc s?c kh?e ni v?i  qu v?. Hy b?o ??m qu v? ph?i th?o lu?n b?t k? v?n ?? g m qu v? c v?i chuyn gia ch?m sc s?c kh?e c?a qu v?. Document Revised: 09/26/2019 Document Reviewed: 09/26/2019 [employer] Patient Education  2021 [employer]  

## 2020-11-25 NOTE — Assessment & Plan Note (Signed)
Well-controlled hypertension. Well-controlled diabetes with hemoglobin A1c at an acceptable level 7.1. Continue Metformin 1000 mg twice a day. Continue rosuvastatin 10 mg once a day. Diet and nutrition discussed. Follow-up in 6 months.

## 2020-11-26 DIAGNOSIS — E1169 Type 2 diabetes mellitus with other specified complication: Secondary | ICD-10-CM | POA: Diagnosis not present

## 2020-11-26 DIAGNOSIS — E1159 Type 2 diabetes mellitus with other circulatory complications: Secondary | ICD-10-CM | POA: Diagnosis not present

## 2020-11-26 DIAGNOSIS — E785 Hyperlipidemia, unspecified: Secondary | ICD-10-CM | POA: Diagnosis not present

## 2020-11-26 DIAGNOSIS — I152 Hypertension secondary to endocrine disorders: Secondary | ICD-10-CM | POA: Diagnosis not present

## 2020-11-27 LAB — CMP14+EGFR
ALT: 21 [IU]/L (ref 0–44)
AST: 24 [IU]/L (ref 0–40)
Albumin/Globulin Ratio: 1.6 (ref 1.2–2.2)
Albumin: 3.9 g/dL (ref 3.7–4.7)
Alkaline Phosphatase: 58 [IU]/L (ref 44–121)
BUN/Creatinine Ratio: 21 (ref 10–24)
BUN: 22 mg/dL (ref 8–27)
Bilirubin Total: 0.3 mg/dL (ref 0.0–1.2)
CO2: 21 mmol/L (ref 20–29)
Calcium: 9.3 mg/dL (ref 8.6–10.2)
Chloride: 102 mmol/L (ref 96–106)
Creatinine, Ser: 1.04 mg/dL (ref 0.76–1.27)
Globulin, Total: 2.4 g/dL (ref 1.5–4.5)
Glucose: 172 mg/dL — ABNORMAL HIGH (ref 65–99)
Potassium: 4.3 mmol/L (ref 3.5–5.2)
Sodium: 141 mmol/L (ref 134–144)
Total Protein: 6.3 g/dL (ref 6.0–8.5)
eGFR: 73 mL/min/{1.73_m2}

## 2020-11-27 LAB — LIPID PANEL
Chol/HDL Ratio: 1.8 ratio (ref 0.0–5.0)
Cholesterol, Total: 107 mg/dL (ref 100–199)
HDL: 58 mg/dL (ref >39)
LDL Chol Calc (NIH): 30 mg/dL (ref 0–99)
Triglycerides: 103 mg/dL (ref 0–149)
VLDL Cholesterol Cal: 19 mg/dL (ref 5–40)

## 2020-12-20 ENCOUNTER — Other Ambulatory Visit: Payer: Self-pay | Admitting: Emergency Medicine

## 2020-12-20 DIAGNOSIS — E785 Hyperlipidemia, unspecified: Secondary | ICD-10-CM

## 2020-12-20 DIAGNOSIS — E1169 Type 2 diabetes mellitus with other specified complication: Secondary | ICD-10-CM

## 2021-02-13 DIAGNOSIS — H25811 Combined forms of age-related cataract, right eye: Secondary | ICD-10-CM | POA: Diagnosis not present

## 2021-02-13 DIAGNOSIS — Z961 Presence of intraocular lens: Secondary | ICD-10-CM | POA: Diagnosis not present

## 2021-02-13 DIAGNOSIS — E119 Type 2 diabetes mellitus without complications: Secondary | ICD-10-CM | POA: Diagnosis not present

## 2021-02-24 ENCOUNTER — Other Ambulatory Visit: Payer: Self-pay | Admitting: Emergency Medicine

## 2021-02-24 DIAGNOSIS — N401 Enlarged prostate with lower urinary tract symptoms: Secondary | ICD-10-CM

## 2021-03-19 ENCOUNTER — Other Ambulatory Visit: Payer: Self-pay | Admitting: Emergency Medicine

## 2021-04-22 ENCOUNTER — Other Ambulatory Visit: Payer: Self-pay | Admitting: Emergency Medicine

## 2021-04-22 DIAGNOSIS — R3915 Urgency of urination: Secondary | ICD-10-CM

## 2021-04-22 DIAGNOSIS — E1169 Type 2 diabetes mellitus with other specified complication: Secondary | ICD-10-CM

## 2021-04-22 DIAGNOSIS — N401 Enlarged prostate with lower urinary tract symptoms: Secondary | ICD-10-CM

## 2021-05-28 ENCOUNTER — Other Ambulatory Visit: Payer: Self-pay

## 2021-05-28 ENCOUNTER — Encounter: Payer: Self-pay | Admitting: Emergency Medicine

## 2021-05-28 ENCOUNTER — Ambulatory Visit (INDEPENDENT_AMBULATORY_CARE_PROVIDER_SITE_OTHER): Payer: Medicare Other | Admitting: Emergency Medicine

## 2021-05-28 VITALS — BP 126/62 | HR 72 | Temp 97.9°F | Ht 65.0 in | Wt 124.0 lb

## 2021-05-28 DIAGNOSIS — Z23 Encounter for immunization: Secondary | ICD-10-CM | POA: Diagnosis not present

## 2021-05-28 DIAGNOSIS — I152 Hypertension secondary to endocrine disorders: Secondary | ICD-10-CM

## 2021-05-28 DIAGNOSIS — E1159 Type 2 diabetes mellitus with other circulatory complications: Secondary | ICD-10-CM | POA: Diagnosis not present

## 2021-05-28 DIAGNOSIS — E785 Hyperlipidemia, unspecified: Secondary | ICD-10-CM

## 2021-05-28 DIAGNOSIS — E1169 Type 2 diabetes mellitus with other specified complication: Secondary | ICD-10-CM | POA: Diagnosis not present

## 2021-05-28 LAB — POCT GLYCOSYLATED HEMOGLOBIN (HGB A1C): Hemoglobin A1C: 7.1 % — AB (ref 4.0–5.6)

## 2021-05-28 NOTE — Progress Notes (Signed)
Erik Aguirre 80 y.o.   Chief Complaint  Patient presents with   Hypertension    Follow up    HISTORY OF PRESENT ILLNESS: This is a 80 y.o. male with history of hypertension and diabetes here for follow-up. Lab Results  Component Value Date   HGBA1C 7.1 (A) 11/25/2020  Presently on metformin 1000 mg twice a day and Cardura 2 mg daily. Also on rosuvastatin 10 mg daily. Doing well.  Has no complaints or medical concerns today. Vy from [employer] helping with [place] translation.  BP Readings from Last 3 Encounters:  05/28/21 126/62  11/25/20 133/72  05/27/20 140/80     Hypertension Pertinent negatives include no chest pain, headaches, palpitations or shortness of breath.    Prior to Admission medications   Medication Sig Start Date End Date Taking? Authorizing Provider  doxazosin (CARDURA) 2 MG tablet TAKE 1 & 1/2 (ONE & ONE-HALF) TABLETS BY MOUTH ONCE DAILY 04/22/21  Yes Georgina Quint, MD  metFORMIN (GLUCOPHAGE) 1000 MG tablet TAKE 1 TABLET BY MOUTH TWICE DAILY WITH A MEAL 03/19/21  Yes Matheson Vandehei, Eilleen Kempf, MD  Multiple Vitamin (MULTIVITAMIN) capsule Take 1 capsule by mouth daily. Reported on 01/15/2016   Yes [provider]  rosuvastatin (CRESTOR) 10 MG tablet Take 1 tablet by mouth once daily 04/22/21  Yes Wynston Romey, [place], MD  ketorolac (ACULAR) 0.4 % SOLN Place 1 drop into the left eye 4 (four) times daily. Patient not taking: No sig reported 05/23/20   [provider]  ofloxacin (OCUFLOX) 0.3 % ophthalmic solution Place 1 drop into the left eye 4 (four) times daily. Patient not taking: No sig reported 05/23/20   [provider]  prednisoLONE acetate (PRED FORTE) 1 % ophthalmic suspension Place 1 drop into the left eye 4 (four) times daily. 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   Benign prostatic hyperplasia (BPH) with urinary urgency 03/17/2013   BPH (benign prostatic hyperplasia) 03/17/2013    Past Medical History:  Diagnosis Date   Acute appendicitis s/p alp appy 11/27/2014 11/26/2014   BPH (benign prostatic hyperplasia)    Hyperlipidemia    Hypertension    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    Past Surgical History:  Procedure Laterality Date   LAPAROSCOPIC APPENDECTOMY N/A 11/27/2014   Procedure: APPENDECTOMY LAPAROSCOPIC ;  Surgeon: Karie Soda, MD;  Location: [facility];  Service: General;  Laterality: N/A;    Social History   Socioeconomic History   Marital status: Married    Spouse name: Not on file   Number of children: 5   Years of education: Not on file   Highest education level: Not on file  Occupational History   Occupation: Retired  Tobacco Use   Smoking status: Never   Smokeless tobacco: Never  Vaping Use   Vaping Use: Never used  Substance and Sexual Activity   Alcohol use: Yes    Comment: occ   Drug use: No   Sexual activity: Not on file  Other Topics Concern   Not on  file  Social History Narrative   Originally from [place].   Language of English okay   Social Determinants of Health   Financial Resource Strain: Not on file  Food Insecurity: Not on file  Transportation Needs: Not on file  Physical Activity: Not on file  Stress: Not on file  Social Connections: Not on file  Intimate Partner Violence: Not on file    Family History  Problem Relation Age of Onset   Throat cancer Brother    Asthma Son      Review of Systems  Constitutional: Negative.  Negative for  chills and fever.  HENT: Negative.  Negative for congestion and sore throat.   Respiratory: Negative.  Negative for cough and shortness of breath.   Cardiovascular: Negative.  Negative for chest pain and palpitations.  Gastrointestinal:  Negative for abdominal pain, diarrhea, nausea and vomiting.  Genitourinary: Negative.   Skin: Negative.  Negative for rash.  Neurological:  Negative for dizziness and headaches.  All other systems reviewed and are negative.  Today's Vitals   05/28/21 1436  BP: 126/62  Pulse: 72  Temp: 97.9 F (36.6 C)  TempSrc: Oral  SpO2: 93%  Weight: 124 lb (56.2 kg)  Height:  (1.651 m)   Body mass index is 20.63 kg/m.  Physical Exam Vitals reviewed.  Constitutional:      Appearance: Normal appearance.  HENT:     Head: Normocephalic.  Eyes:     Extraocular Movements: Extraocular movements intact.     Pupils: Pupils are equal, round, and reactive to light.  Cardiovascular:     Rate and Rhythm: Normal rate and regular rhythm.     Pulses: Normal pulses.     Heart sounds: Normal heart sounds.  Pulmonary:     Effort: Pulmonary effort is normal.     Breath sounds: Normal breath sounds.  Musculoskeletal:        General: Normal range of motion.     Cervical back: Normal range of motion and neck supple.  Skin:    General: Skin is warm and dry.     Capillary Refill: Capillary refill takes less than 2 seconds.  Neurological:     General: No focal deficit present.     Mental Status: He is alert and oriented to person, place, and time.  Psychiatric:        Mood and Affect: Mood normal.        Behavior: Behavior normal.   Results for orders placed or performed in visit on 05/28/21 (from the past 24 hour(s))  POCT glycosylated hemoglobin (Hb A1C)     Status: Abnormal   Collection Time: 05/28/21  3:02 PM  Result Value Ref Range   Hemoglobin A1C 7.1 (A) 4.0 - 5.6 %   HbA1c POC (<> result, manual entry)     HbA1c, POC (prediabetic range)     HbA1c,  POC (controlled diabetic range)       ASSESSMENT & PLAN: Hypertension associated with diabetes (HCC) Well-controlled hypertension.  Continue doxazosin 2 mg daily. BP Readings from Last 3 Encounters:  05/28/21 126/62  11/25/20 133/72  05/27/20 140/80  Well-controlled diabetes with hemoglobin A1c of 7.1. Continue metformin 1000 mg twice a day. Diet and nutrition discussed. Follow-up in 6 months.   Dyslipidemia associated with type 2 diabetes mellitus (HCC) Diet and nutrition discussed.  Continue rosuvastatin 10 mg daily.  Jermal was seen today for hypertension.  Diagnoses and all orders for this visit:  Hypertension  associated with diabetes (HCC) -     POCT glycosylated hemoglobin (Hb A1C)  Dyslipidemia associated with type 2 diabetes mellitus (HCC)  Need for influenza vaccination -     Flu Vaccine QUAD High Dose(Fluad)  Patient Instructions  B?nh ti?u ???ng v dinh d??ng, ng??i l?n Diabetes Mellitus and Nutrition, Adult Khi qu v? b? ti?u ???ng hay b?nh ti?u ???ng, ?i?u r?t quan tr?ng l ph?i c thi quen ?n u?ng t?t cho s?c kh?e b?i v nh?ng g qu v? ?n v u?ng ?nh h??ng l?n ??n n?ng ?? ???ng huy?t (glucose) c?a qu v?. ?n th?c ph?m t?t cho s?c kh?e v?i s? l??ng v?a ph?i, vo cng cc th?i ?i?m m?i ngy, c th? gip qu v?: Ki?m sot ???ng huy?t. Gi?m nguy c? b? b?nh tim. C?i thi?n huy?t p. ??t ???c ho?c duy tr m??c cn n?ng c l?i cho s?c kh?e. ?i?u g c th? ?nh h??ng ??n k? ho?ch ?n u?ng c?a ti? M?i ng??i b? ti?u ???ng khc nhau v m?i ng??i ??u c nhu c?u v? ch??ng trnh ?n u?ng khc nhau. Chuyn gia ch?m [place] s?c kh?e c?a qu v? c th? [place] qu v? trao ??i v?i chuyn gia dinh d??ng ?? xy d?ng ch??ng trnh ?n u?ng t?t nh?t cho qu v?. Ch??ng trnh ?n u?ng c?a qu v? c th? thay ??i ty thu?c vo cc y?u t? nh?: L??ng calo quy? vi? c?n. Cc lo?i thu?c m quy? vi? du?ng. Cn n??ng cu?a quy? vi?. N?ng ?? ???ng huy?t, huy?t p v cholesterol c?a qu v?. M?c ?? ho?t  ??ng c?a qu v?. Cc tnh tr?ng s?c kh?e khc m qu v? c, ch?ng h?n nh? b?nh tim ho?c b?nh th?n. Carbohydrate ?nh h??ng nh? th? no ??n ti? Carbohydrate, hay cn g?i l carb, ?nh h??ng ??n l??ng ???ng huy?t c?a quy? vi? nhi?u h?n b?t k? lo?i th?c ph?m no khc. ?n carb theo cch t? nhin lm t?ng l??ng glucose trong mu. Ti?nh l???ng carb l m?t ph??ng php theo di l??ng carb m quy? vi? ?n. Ti?nh l???ng carb c vai tr quan tr?ng trong vi?c gi? cho ???ng huy?t cu?a quy? vi? ? m?c c l?i cho s?c kh?e, ??c bi?t l n?u quy? vi? du?ng insulin ho?c u?ng m?t s? lo?i thu?c nh?t ??nh ?i?u tri? ti?u ???ng. Bi?t l??ng carb m qu v? c th? ?n m?t cch an ton trong m?i b?a ?n c vai tr quan tr?ng. L??ng carbohydrate ny khc nhau v?i m?i ng??i. Chuyn gia dinh d??ng c?a qu v? c th? gip tnh ton l??ng carb m qu v? c?n ph?i ?n vo m?i b?a ?n chnh v m?i b?a ?n nh?. R??u ?nh h??ng ??n ti nh? th? no? R??u c th? gy gi?m ???ng huy?t (h? ???ng huy?t) ??t ng?t, ??c bi?t l n?u qu v? s? d?ng insulin ho?c u?ng m?t s? lo?i thu?c nh?t ??nh ?i?u tr? ti?u ???ng. H? ???ng huy?t c th? l m?t tnh tr?ng ?e d?a tnh ma?ng. Cc tri?u ch?ng c?a h? ???ng huy?t, ch?ng h?n bu?n ng?, chng m?t v l l?n, t??ng t? cc tri?u ch?ng c?a vi?c u?ng qu nhi?u r??u. Khng u?ng r??u n?u: Chuyn gia ch?m [place] s?c kh?e khuyn qu v? khng u?ng r??u. Qu v? c [place], c th? c [place], ho?c c k? ho?ch c [place]. N?u qu v? u?ng r??u: Khng u?ng r???u khi ?o?i. Gi?i h?n l??ng r??u qu v? u?ng ? m?c: 0-1 ly/ngy ??i v?i n? gi?i. 0-2 ly/ngy ??i v?i nam gi?i. Bi?t r m?t ly  c bao nhiu r??u. ? M?, m?t ly t??ng ???ng v?i m?t chai bia 12 ao x? (355 mL), m?t ly r??u vang 5 ao x? (148 mL), ho?c m?t ly r??u m?nh 1 ao x? (44 mL). Lun b ?? n??c b?ng n??c, soda dnh cho ng??i ?n king, ho?c tr ? khng ???ng. Tamera Punt nh? r?ng soda thng th???ng, n??c tri cy v ca?c ?? u?ng h?n h??p khc c th? ch?a nhi?u ???ng v ph?i ???c tnh l  carb. C nh?ng l?i khuyn no ?? tun th? k? ho?ch ny? ??c nhn th?c ph?m B?t ??u b?ng vi?c ki?m tra c? kh?u ph?n trn nhn "Thng tin dinh d??ng" c?a th?c ph?m v ?? u?ng ?ng gi. L??ng calo, carb, ch?t bo v cc ch?t dinh d??ng khc ghi trn nhn d?a trn m?t kh?u ph?n c?a mn ?. Nhi?u mn ch?a nhi?u h?n m?t kh?u ph?n trn m?i bao b. Ki?m tra t?ng s? gam (g) carb trong m?t kh?u ph?n. Qu v? c th? tnh s? kh?u ph?n carb trong m?t kh?u ph?n b?ng cch chia t?ng carb cho 15. V d?: n?u m?t th?c ph?m ch?a t?ng 30 g carb m?i kh?u ph?n, n s? t??ng ???ng v?i 2 kh?u ph?n carb. Ki?m tra s? gam (g) ch?t bo bo ha v ch?t bo chuy?n ha trong m?t kh?u ph?n. Ch?n th?c ph?m c t ho?c khng c nh?ng ch?t bo ny. Ki?m tra s? miligam (mg) mu?i (natri) trong m?t kh?u ph?n. H?u h?t m?i ng??i ??u nn gi?i h?n t?ng natri tiu thu? ?? m??c d??i 2.300 mg m?i ngy. Lun ki?m tra thng tin dinh d??ng c?a th?c ph?m ghi trn nhn l "t bo" hay "khng bo". Nh?ng th?c ph?m ny c th? c b? sung ???ng ho?c carb tinh luy?n cao h?n v c?n ph?i trnh. Hy trao ??i v?i chuyn gia dinh d??ng c?a qu v? ?? xc ??nh m?c tiu hng ngy c?a qu v? ??i v?i cc ch?t dinh d??ng li?t k trn nhn. Mua s?m Trnh mua nh?ng th?c ph?m ?ng h?p, lm s?n ho?c ch? bi?n s?n. Nh?ng th?c ph?m ny c xu h??ng c nhi?u ch?t bo, natri, b? sung ???ng. Mua s?m quanh ra ngoi c?a c?a hng t?p ha. ?y l n?i th??ng t?p trung tri cy v rau c? t??i, nhi?u lo?i h?t, th?t t??i v s?a t??i. N?u n??ng S? d?ng ca?c ph??ng php n?u ?n nhi?t ?? th?p, ch?ng h?n nh? n??ng, thay v ph??ng php n?u nhi?t ?? cao nh? chin ng?p d?u m??. N?u ?n b?ng cch s? d?ng cc lo?i d?u t?t cho s?c kh?e, ch?ng h?n nh? d?u  liu, d?u h?t c?i d?u ho?c d?u h??ng d??ng. [place] n?u v?i b?, kem, ho?c th?t nhi?u m?. Ln k? ho?ch cho b?a ?n ?n cc b?a ?n chnh v cc b?a ?n nh? ??u ??n, t?t nh?t l vo cng m?t th?i ?i?m m?i ngy. Trnh nhi?n ?n trong th?i [employer]. ?n  th?c ?n giu ch?t x? nh? cc lo?i tri cy t??i, rau c?, ??u v ng? c?c nguyn cm. Trao ??i v?i chuyn khoa dinh d??ng xem qu v? c th? ?n bao nhiu kh?u ph?n carb m?i b?a ?n. ?n 4-6 ao-x? (oz) (112-168 g) protein n?c m?i ngy, ch?ng h?n nh? th?t n?c, th?t g, c, tr?ng, ho?c ??u ph?. M?t ao-x? (oz) protein n?c b?ng: 1 ao-x? (oz) (28 g) th?t, th?t g ho?c c. 1 qu? tr?ng. 1?4 c?c (62 g) ??u ph?. ?n m?t s? th?c ph?m ch?a cc ch?t bo lnh m?nh m?i  ngy, ch?ng h?n nh? qu? b?, qu? h?ch, cc lo?i h?t v c. Ti nn ?n nh?ng th?c ph?m no? Tri cy Qu? m?ng. To. Karen Chafe? ?o. Qu? m?. Qu? m?n. Nho. Qu? xoi. Qu? ?u ??Ladell Heads? l?u. Qu? kiwi. Qu? anh ?o. Derrek Gu c? Rau di?p. Rau chn v?t. Ferne Coe, bao g?m c?i xo?n, c?i c?u v?ng, c?i r? v c? b?. C? c?i ???ng. Sp l?. C?i b?p. Bng c?i xanh. C r?t. ??u xanh. C chua. ?t ng?t. Hnh ty. D?a chu?t. C?i bruxen. Ng? c?c Ng? c?c nguyn ca?m nh? bnh m nguyn ca?m ho??c nguyn h?t, bnh quy gin, bnh m ng, ng? c?c v m ?ng. B?t y?n m?ch khng ???ng. H?t dim m?ch (quinoa). G?o d?i ho?c g?o khng xt. Th?t v cc protein khc H?i s?n. Th?t gia c?m khng da. Mi?ng n?c th?t gia c?m v th?t b. ??u ph?. Qu? h?ch. Cc lo?i h?t. S?a Cc s?n ph?m s??a khng co? ho??c co? i?t ch?t be?o, ch?ng h?n nh? s?a, s??a chua va? pho mt. Nh?ng th?c ph?m li?t k ? trn c th? khng ph?i l m?t danh m?c ??y ?? cc lo?i th?c ph?m v ?? u?ng m qu v? c th? ?n. Lin h? v?i chuyn gia dinh d??ng ?? c thm thng tin. Ti nn trnh nh?ng th?c ph?m no? Tri cy Tri cy ?ng h?p xi-r. Derrek Gu c? Derrek Gu c? ?ng h?p. Rau ?ng l?nh v?i b? ho?c n??c s?t kem. Ng? c?c Cc s?n ph?m lm t? b?t m tr?ng v b?t m ???c tinh ch? nh? bnh m, m ?ng, ?? ?n nh? v ng? c?c. Trnh t?t c? cc th?c ph?m ch? bi?n s?n. Th?t v cc protein khc Cc la?t thi?t m??. Th?t gia c?m c da. Th?t chin/rn ho?c t?m b?t rn. Th?t ch? bi?n s?n. Trnh cc ch?t bo bo ha. S?a S?a chua nguyn ch?t bo, pho  mt ho?c s?a. ?? u?ng ?? u?ng c ???ng, ch?ng h?n nh? soda ho?c tr ?Marland Kitchen Nh?ng th?c ph?m li?t k ? trn c th? khng ph?i l m?t danh m?c ??y ?? cc lo?i th?c ph?m v ?? u?ng qu v? nn trnh. Lin h? v?i chuyn gia dinh d??ng ?? c thm thng tin. Ca?c cu h?i c?n ??a ra v?i chuyn gia ch?m [place] s?c kh?e Ti co? c?n g??p m?t chuyn gia h???ng d?n v? b?nh ti?u ????ng khng? Ti c c?n g?p chuyn gia dinh d??ng khng? Ti co? th? go?i cho s? na?o n?u ti co? th?c m?c? Khi no l th?i ?i?m ki?m tra ???ng huy?t t?t nh?t? N?i ?? tm thm thng tin: American Diabetes Association (Hi?p h?i Ti?u ????ng [place] K?): diabetes.org Academy of Nutrition and Dietetics (H?c vi?n Dinh d??ng v Ch? ?? ?n): www.eatright.[employer] of Diabetes and Digestive and Kidney Diseases (Vi?n Ti?u ???ng v cc B?nh Tiu ha v B?nh Th?n Qu?c gia): Car[employer]li[employer].tn Association of Diabetes Care and Education Specialists (Hi?p h?i Chuyn gia Gio d?c Ch?m [place] B?nh ti?u ???ng): www.diabeteseducator.org Tm t?t ?i?u r?t quan tr?ng l ph?i c thi quen ?n u?ng t?t cho s?c kh?e b?i v nh?ng g qu v? ?n v u?ng ?nh h??ng l?n ??n n?ng ?? ???ng huy?t (glucose) c?a qu v?. Ch??ng trnh b?a ?n t?t cho s?c kh?e s? gip qu v? ki?m sot ???ng huy?t v duy tr l?i s?ng lnh m?nh. Chuyn gia ch?m [place] s?c kh?e c?a qu v? c th? [place] qu v? trao ??i v?i chuyn gia dinh d??ng ?? xy d?ng ch??ng trnh ?n u?ng t?t  nh?t cho qu v?. Lun nh? r?ng carbohydrate (carb) v r??u c tc ??ng ngay l?p t?c ??n n?ng ?? ???ng huy?t c?a qu v?. ?i?u quan tr?ng l ph?i tnh carb v s? d?ng r??u m?t cch th?n tr?ng. Thng tin ny khng nh?m m?c ?ch thay th? cho l?i khuyn m chuyn gia ch?m [place] s?c kh?e ni v?i qu v?. Hy b?o ??m qu v? ph?i th?o lu?n b?t k? v?n ?? g m qu v? c v?i chuyn gia ch?m [place] s?c kh?e c?a qu v?. Document Revised: 09/26/2019 Document Reviewed: 09/26/2019 Elsevier Patient Education  2021 Elsevier  Inc.    Edwina Barth, MD [facility]

## 2021-05-28 NOTE — Assessment & Plan Note (Signed)
Well-controlled hypertension.  Continue doxazosin 2 mg daily. BP Readings from Last 3 Encounters:  05/28/21 126/62  11/25/20 133/72  05/27/20 140/80  Well-controlled diabetes with hemoglobin A1c of 7.1. Continue metformin 1000 mg twice a day. Diet and nutrition discussed. Follow-up in 6 months.

## 2021-05-28 NOTE — Assessment & Plan Note (Signed)
Diet and nutrition discussed. Continue rosuvastatin 10 mg daily.  

## 2021-05-28 NOTE — Patient Instructions (Signed)
B?nh ti?u ???ng v dinh d??ng, ng??i l?n Diabetes Mellitus and Nutrition, Adult Khi qu v? b? ti?u ???ng hay b?nh ti?u ???ng, ?i?u r?t quan tr?ng l ph?i c thi quen ?n u?ng t?t cho s?c kh?e b?i v nh?ng g qu v? ?n v u?ng ?nh h??ng l?n ??n n?ng ?? ???ng huy?t (glucose) c?a qu v?. ?n th?c ph?m t?t cho s?c kh?e v?i s? l??ng v?a ph?i, vo cng cc th?i ?i?m m?i ngy, c th? gip qu v?: Ki?m sot ???ng huy?t. Gi?m nguy c? b? b?nh tim. C?i thi?n huy?t p. ??t ???c ho?c duy tr m??c cn n?ng c l?i cho s?c kh?e. ?i?u g c th? ?nh h??ng ??n k? ho?ch ?n u?ng c?a ti? M?i ng??i b? ti?u ???ng khc nhau v m?i ng??i ??u c nhu c?u v? ch??ng trnh ?n u?ng khc nhau. Chuyn gia ch?m sc s?c kh?e c?a qu v? c th? khuyn qu v? trao ??i v?i chuyn gia dinh d??ng ?? xy d?ng ch??ng trnh ?n u?ng t?t nh?t cho qu v?. Ch??ng trnh ?n u?ng c?a qu v? c th? thay ??i ty thu?c vo cc y?u t? nh?: L??ng calo quy? vi? c?n. Cc lo?i thu?c m quy? vi? du?ng. Cn n??ng cu?a quy? vi?. N?ng ?? ???ng huy?t, huy?t p v cholesterol c?a qu v?. M?c ?? ho?t ??ng c?a qu v?. Cc tnh tr?ng s?c kh?e khc m qu v? c, ch?ng h?n nh? b?nh tim ho?c b?nh th?n. Carbohydrate ?nh h??ng nh? th? no ??n ti? Carbohydrate, hay cn g?i l carb, ?nh h??ng ??n l??ng ???ng huy?t c?a quy? vi? nhi?u h?n b?t k? lo?i th?c ph?m no khc. ?n carb theo cch t? nhin lm t?ng l??ng glucose trong mu. Ti?nh l???ng carb l m?t ph??ng php theo di l??ng carb m quy? vi? ?n. Ti?nh l???ng carb c vai tr quan tr?ng trong vi?c gi? cho ???ng huy?t cu?a quy? vi? ? m?c c l?i cho s?c kh?e, ??c bi?t l n?u quy? vi?du?ng insulin ho?c u?ng m?t s? lo?i thu?c nh?t ??nh ?i?u tri? ti?u ???ng. Bi?t l??ng carb m qu v? c th? ?n m?t cch an ton trong m?i b?a ?n c vai tr quan tr?ng. L??ng carbohydrate ny khc nhau v?i m?i ng??i. Chuyn gia dinh d??ng c?a qu v? c th? gip tnh ton l??ng carb m qu v? c?n ph?i ?n vo m?ib?a ?n chnh v m?i b?a ?n  nh?. R??u ?nh h??ng ??n ti nh? th? no? R??u c th? gy gi?m ???ng huy?t (h? ???ng huy?t) ??t ng?t, ??c bi?t l n?u qu v? s? d?ng insulin ho?c u?ng m?t s? lo?i thu?c nh?t ??nh ?i?u tr? ti?u ???ng. H? ???ng huy?t c th? l m?t tnh tr?ng ?e d?a tnh ma?ng. Cc tri?u ch?ng c?a h? ???ng huy?t, ch?ng h?n bu?n ng?, chng m?t v l l?n, t??ng t? cc tri?u ch?ng c?a vi?c u?ng qu nhi?u r??u. Khng u?ng r??u n?u: Chuyn gia ch?m sc s?c kh?e khuyn qu v? khng u?ng r??u. Qu v? c thai, c th? c thai, ho?c c k? ho?ch c thai. N?u qu v? u?ng r??u: Khng u?ng r???u khi ?o?i. Gi?i h?n l??ng r??u qu v? u?ng ? m?c: 0-1 ly/ngy ??i v?i n? gi?i. 0-2 ly/ngy ??i v?i nam gi?i. Bi?t r m?t ly c bao nhiu r??u. ? M?, m?t ly t??ng ???ng v?i m?t chai bia 12 ao x? (355 mL), m?t ly r??u vang 5 ao x? (148 mL), ho?c m?t ly r??u m?nh 1 ao x? (44 mL). Lun b ?? n??c b?ng n??c,   soda dnh cho ng??i ?n king, ho?c tr ? khng ???ng. Lun nh? r?ng soda thng th???ng, n??c tri cy v ca?c ?? u?ng h?n h??p khc c th? ch?a nhi?u ???ng v ph?i ???c tnh l carb. C nh?ng l?i khuyn no ?? tun th? k? ho?ch ny?  ??c nhn th?c ph?m B?t ??u b?ng vi?c ki?m tra c? kh?u ph?n trn nhn "Thng tin dinh d??ng" c?a th?c ph?m v ?? u?ng ?ng gi. L??ng calo, carb, ch?t bo v cc ch?t dinh d??ng khc ghi trn nhn d?a trn m?t kh?u ph?n c?a mn ?. Nhi?u mn ch?a nhi?u h?n m?t kh?u ph?n trn m?i bao b. Ki?m tra t?ng s? gam (g) carb trong m?t kh?u ph?n. Qu v? c th? tnh s? kh?u ph?n carb trong m?t kh?u ph?n b?ng cch chia t?ng carb cho 15. V d?: n?u m?t th?c ph?m ch?a t?ng 30 g carb m?i kh?u ph?n, n s? t??ng ???ng v?i 2 kh?u ph?n carb. Ki?m tra s? gam (g) ch?t bo bo ha v ch?t bo chuy?n ha trong m?t kh?u ph?n. Ch?n th?c ph?m c t ho?c khng c nh?ng ch?t bo ny. Ki?m tra s? miligam (mg) mu?i (natri) trong m?t kh?u ph?n. H?u h?t m?i ng??i ??u nn gi?i h?n t?ng natri tiu thu? ?? m??c d??i 2.300 mg m?i ngy. Lun  ki?m tra thng tin dinh d??ng c?a th?c ph?m ghi trn nhn l "t bo" hay "khng bo". Nh?ng th?c ph?m ny c th? c b? sung ???ng ho?c carb tinh luy?n cao h?n v c?n ph?i trnh. Hy trao ??i v?i chuyn gia dinh d??ng c?a qu v? ?? xc ??nh m?c tiu hng ngy c?a qu v? ??i v?i cc ch?t dinh d??ng li?t k trn nhn. Mua s?m Trnh mua nh?ng th?c ph?m ?ng h?p, lm s?n ho?c ch? bi?n s?n. Nh?ng th?c ph?m ny c xu h??ng c nhi?u ch?t bo, natri, b? sung ???ng. Mua s?m quanh ra ngoi c?a c?a hng t?p ha. ?y l n?i th??ng t?p trung tri cy v rau c? t??i, nhi?u lo?i h?t, th?t t??i v s?a t??i. N?u n??ng S? d?ng ca?c ph??ng php n?u ?n nhi?t ?? th?p, ch?ng h?n nh? n??ng, thay v ph??ng php n?u nhi?t ?? cao nh? chin ng?p d?u m??. N?u ?n b?ng cch s? d?ng cc lo?i d?u t?t cho s?c kh?e, ch?ng h?n nh? d?u  liu, d?u h?t c?i d?u ho?c d?u h??ng d??ng. Trnh n?u v?i b?, kem, ho?c th?t nhi?u m?. Ln k? ho?ch cho b?a ?n ?n cc b?a ?n chnh v cc b?a ?n nh? ??u ??n, t?t nh?t l vo cng m?t th?i ?i?m m?i ngy. Trnh nhi?n ?n trong th?i gian di. ?n th?c ?n giu ch?t x? nh? cc lo?i tri cy t??i, rau c?, ??u v ng? c?c nguyn cm. Trao ??i v?i chuyn khoa dinh d??ng xem qu v? c th? ?n bao nhiu kh?u ph?n carb m?i b?a ?n. ?n 4-6 ao-x? (oz) (112-168 g) protein n?c m?i ngy, ch?ng h?n nh? th?t n?c, th?t g, c, tr?ng, ho?c ??u ph?. M?t ao-x? (oz) protein n?c b?ng: 1 ao-x? (oz) (28 g) th?t, th?t g ho?c c. 1 qu? tr?ng. 1?4 c?c (62 g) ??u ph?. ?n m?t s? th?c ph?m ch?a cc ch?t bo lnh m?nh m?i ngy, ch?ng h?n nh? qu? b?, qu? h?ch, cc lo?i h?t v c. Ti nn ?n nh?ng th?c ph?m no? Tri cy Qu? m?ng. To. Cam. Qu? ?o. Qu? m?. Qu? m?n. Nho. Qu? xoi. Qu? ?u ??. Qu?l?u. Qu? kiwi. Qu? anh ?o.   Rau c? Rau di?p. Rau chn v?t. Rau xanh, bao g?m c?i xo?n, c?i c?u v?ng, c?i r? v c? b?. C? c?i ???ng. Sp l?. C?i b?p. Bng c?i xanh. C r?t. ??u xanh. C chua. ?tng?t. Hnh ty. D?a chu?t. C?i bruxen. Ng?  c?c Ng? c?c nguyn ca?m nh? bnh m nguyn ca?m ho??c nguyn h?t, bnh quy gin, bnh m ng, ng? c?c v m ?ng. B?t y?n m?ch khng ???ng. H?t dim m?ch(quinoa). G?o d?i ho?c g?o khng xt. Th?t v cc protein khc H?i s?n. Th?t gia c?m khng da. Mi?ng n?c th?t gia c?m v th?t b. ??u ph?. Qu?h?ch. Cc lo?i h?t. S?a Cc s?n ph?m s??a khng co? ho??c co? i?t ch?t be?o, ch?ng h?n nh? s?a, s??achua va? pho mt. Nh?ng th?c ph?m li?t k ? trn c th? khng ph?i l m?t danh m?c ??y ?? cc lo?i th?c ph?m v ?? u?ng m qu v? c th? ?n. Lin h? v?i chuyn gia dinh d??ng ?? c thm thng tin. Ti nn trnh nh?ng th?c ph?m no? Tri cy Tri cy ?ng h?p xi-r. Rau c? Rau c? ?ng h?p. Rau ?ng l?nh v?i b? ho?c n??c s?t kem. Ng? c?c Cc s?n ph?m lm t? b?t m tr?ng v b?t m ???c tinh ch? nh? bnh m, m ?ng,?? ?n nh? v ng? c?c. Trnh t?t c? cc th?c ph?m ch? bi?n s?n. Th?t v cc protein khc Cc la?t thi?t m??. Th?t gia c?m c da. Th?t chin/rn ho?c t?m b?t rn. Th?tch? bi?n s?n. Trnh cc ch?t bo bo ha. S?a S?a chua nguyn ch?t bo, pho mt ho?c s?a. ?? u?ng ?? u?ng c ???ng, ch?ng h?n nh? soda ho?c tr ?. Nh?ng th?c ph?m li?t k ? trn c th? khng ph?i l m?t danh m?c ??y ?? cc lo?i th?c ph?m v ?? u?ng qu v? nn trnh. Lin h? v?i chuyn gia dinh d??ng ?? c thm thng tin. Ca?c cu h?i c?n ??a ra v?i chuyn gia ch?m sc s?c kh?e Ti co? c?n g??p m?t chuyn gia h???ng d?n v? b?nh ti?u ????ng khng? Ti c c?n g?p chuyn gia dinh d??ng khng? Ti co? th? go?i cho s? na?o n?u ti co? th?c m?c? Khi no l th?i ?i?m ki?m tra ???ng huy?t t?t nh?t? N?i ?? tm thm thng tin: American Diabetes Association (Hi?p h?i Ti?u ????ng Hoa K?): diabetes.org Academy of Nutrition and Dietetics (H?c vi?n Dinh d??ng v Ch? ?? ?n): www.eatright.org National Institute of Diabetes and Digestive and Kidney Diseases (Vi?n Ti?u ???ng v cc B?nh Tiu ha v B?nh Th?n Qu?c gia):  www.niddk.nih.gov Association of Diabetes Care and Education Specialists (Hi?p h?i Chuyn gia Gio d?c Ch?m sc B?nh ti?u ???ng): www.diabeteseducator.org Tm t?t ?i?u r?t quan tr?ng l ph?i c thi quen ?n u?ng t?t cho s?c kh?e b?i v nh?ng g qu v? ?n v u?ng ?nh h??ng l?n ??n n?ng ?? ???ng huy?t (glucose) c?a qu v?. Ch??ng trnh b?a ?n t?t cho s?c kh?e s? gip qu v? ki?m sot ???ng huy?t v duy tr l?i s?ng lnh m?nh. Chuyn gia ch?m sc s?c kh?e c?a qu v? c th? khuyn qu v? trao ??i v?i chuyn gia dinh d??ng ?? xy d?ng ch??ng trnh ?n u?ng t?t nh?t cho qu v?. Lun nh? r?ng carbohydrate (carb) v r??u c tc ??ng ngay l?p t?c ??n n?ng ?? ???ng huy?t c?a qu v?. ?i?u quan tr?ng l ph?i tnh carb v s? d?ng r??u m?t cch th?n tr?ng. Thng tin ny khng nh?m m?c ?ch thay th? cho   l?i khuyn m chuyn gia ch?m sc s?c kh?e ni v?i qu v?. Hy b?o ??m qu v? ph?i th?o lu?n b?t k? v?n ?? gm qu v? c v?i chuyn gia ch?m sc s?c kh?e c?a qu v?. Document Revised: 09/26/2019 Document Reviewed: 09/26/2019 [employer] Patient Education  2021 [employer]  

## 2021-06-09 ENCOUNTER — Other Ambulatory Visit: Payer: Self-pay | Admitting: Emergency Medicine

## 2021-06-09 DIAGNOSIS — N401 Enlarged prostate with lower urinary tract symptoms: Secondary | ICD-10-CM

## 2021-07-24 ENCOUNTER — Other Ambulatory Visit: Payer: Self-pay | Admitting: Emergency Medicine

## 2021-07-24 DIAGNOSIS — E1169 Type 2 diabetes mellitus with other specified complication: Secondary | ICD-10-CM

## 2021-07-24 DIAGNOSIS — R3915 Urgency of urination: Secondary | ICD-10-CM

## 2021-09-02 ENCOUNTER — Other Ambulatory Visit: Payer: Self-pay | Admitting: Emergency Medicine

## 2021-11-03 ENCOUNTER — Other Ambulatory Visit: Payer: Self-pay

## 2021-11-03 ENCOUNTER — Ambulatory Visit (INDEPENDENT_AMBULATORY_CARE_PROVIDER_SITE_OTHER): Payer: Medicare Other | Admitting: Emergency Medicine

## 2021-11-03 ENCOUNTER — Encounter: Payer: Self-pay | Admitting: Emergency Medicine

## 2021-11-03 VITALS — BP 122/70 | HR 71 | Temp 98.4°F | Ht 65.0 in | Wt 125.5 lb

## 2021-11-03 DIAGNOSIS — R3915 Urgency of urination: Secondary | ICD-10-CM

## 2021-11-03 DIAGNOSIS — E1169 Type 2 diabetes mellitus with other specified complication: Secondary | ICD-10-CM

## 2021-11-03 DIAGNOSIS — E1159 Type 2 diabetes mellitus with other circulatory complications: Secondary | ICD-10-CM

## 2021-11-03 DIAGNOSIS — E119 Type 2 diabetes mellitus without complications: Secondary | ICD-10-CM

## 2021-11-03 DIAGNOSIS — N401 Enlarged prostate with lower urinary tract symptoms: Secondary | ICD-10-CM

## 2021-11-03 DIAGNOSIS — R350 Frequency of micturition: Secondary | ICD-10-CM

## 2021-11-03 DIAGNOSIS — I152 Hypertension secondary to endocrine disorders: Secondary | ICD-10-CM

## 2021-11-03 DIAGNOSIS — N138 Other obstructive and reflux uropathy: Secondary | ICD-10-CM

## 2021-11-03 DIAGNOSIS — E785 Hyperlipidemia, unspecified: Secondary | ICD-10-CM

## 2021-11-03 LAB — POCT URINALYSIS DIPSTICK
Bilirubin, UA: NEGATIVE
Glucose, UA: POSITIVE — AB
Ketones, UA: NEGATIVE
Leukocytes, UA: NEGATIVE
Nitrite, UA: NEGATIVE
Protein, UA: NEGATIVE
Spec Grav, UA: 1.01 (ref 1.010–1.025)
Urobilinogen, UA: 0.2 E.U./dL
pH, UA: 6 (ref 5.0–8.0)

## 2021-11-03 MED ORDER — OXYBUTYNIN CHLORIDE ER 5 MG PO TB24: 5.0000 mg | ORAL_TABLET | Freq: Every day | ORAL | 1 refills | Status: AC

## 2021-11-03 NOTE — Assessment & Plan Note (Addendum)
Well-controlled hypertension. BP Readings from Last 3 Encounters:  11/03/21 122/70  05/28/21 126/62  11/25/20 133/72  Well-controlled diabetes with hemoglobin A1c of 6.8.  Continue metformin 1000 mg twice a day.

## 2021-11-03 NOTE — Assessment & Plan Note (Signed)
Stable.  Continue rosuvastatin 10 mg daily. 

## 2021-11-03 NOTE — Patient Instructions (Signed)

## 2021-11-03 NOTE — Progress Notes (Signed)
Erik Aguirre 81 y.o.   Chief Complaint  Patient presents with   Urinary Frequency   buning with urination     HISTORY OF PRESENT ILLNESS: This is a 81 y.o. male complaining of urinary frequency worse at night. Has history of BPH on Cardura with improvement of urinary flow.  Still having symptoms of overactive bladder. No other complaints or medical concerns. [employer] [place] interpreter used for this encounter. Lab Results  Component Value Date   HGBA1C 7.1 (A) 05/28/2021     Urinary Frequency  Associated symptoms include frequency. Pertinent negatives include no chills, nausea or vomiting. b   Prior to Admission medications   Medication Sig Start Date End Date Taking? Authorizing Provider  doxazosin (CARDURA) 2 MG tablet TAKE 1 & 1/2 (ONE & ONE-HALF) TABLETS BY MOUTH ONCE DAILY 07/24/21  Yes Kameran Lallier, Eilleen Kempf, MD  metFORMIN (GLUCOPHAGE) 1000 MG tablet TAKE 1 TABLET BY MOUTH TWICE DAILY WITH A MEAL 09/03/21  Yes Reeanna Acri, Eilleen Kempf, MD  Multiple Vitamin (MULTIVITAMIN) capsule Take 1 capsule by mouth daily. 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   Benign prostatic hyperplasia (BPH) with urinary urgency 03/17/2013   BPH (benign prostatic hyperplasia) 03/17/2013    Past Medical History:  Diagnosis Date   Acute appendicitis s/p alp appy 11/27/2014 11/26/2014   BPH (benign prostatic hyperplasia)     Hyperlipidemia    Hypertension    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    Past Surgical History:  Procedure Laterality Date   LAPAROSCOPIC APPENDECTOMY N/A 11/27/2014   Procedure: APPENDECTOMY LAPAROSCOPIC ;  Surgeon: Karie Soda, MD;  Location: [facility];  Service: General;  Laterality: N/A;    Social History   Socioeconomic History   Marital status: Married    Spouse name: Not on file   Number of children: 5   Years of education: Not on file   Highest education level: Not on file  Occupational History   Occupation: Retired  Tobacco Use   Smoking status: Never   Smokeless tobacco: Never  Vaping Use   Vaping Use: Never used  Substance and Sexual Activity   Alcohol use: Yes    Comment: occ   Drug use: No   Sexual activity: Not on file  Other Topics Concern   Not on file  Social History Narrative   Originally from [place].   Language of English okay   Social Determinants of Health   Financial Resource Strain: Not on file  Food Insecurity: Not on file  Transportation Needs: Not on file  Physical Activity: Not on file  Stress: Not on file  Social Connections: Not on file  Intimate Partner Violence: Not on file    Family History  Problem Relation Age of Onset   Throat cancer Brother    Asthma Son      Review of Systems  Constitutional: Negative.  Negative for chills and fever.  HENT:  Negative.  Negative for congestion and sore throat.   Respiratory: Negative.  Negative for cough and shortness of breath.   Cardiovascular: Negative.  Negative for chest pain and palpitations.  Gastrointestinal:  Negative for abdominal pain, blood in stool, diarrhea, nausea and vomiting.  Genitourinary:  Positive for frequency.  Skin: Negative.  Negative for rash.  All other systems reviewed and are negative.  Today's Vitals   11/03/21 1443  BP: 122/70  Pulse: 71  Temp: 98.4 F (36.9 C)  TempSrc: Oral  SpO2: 97%  Weight: 125 lb 8 oz (56.9 kg)  Height:  5\' 5"  (1.651 m)   Body mass index is 20.88 kg/m.  Physical Exam Vitals reviewed.  HENT:     Head: Normocephalic.     Mouth/Throat:     Mouth: Mucous membranes are moist.     Pharynx: Oropharynx is clear.  Eyes:     Extraocular Movements: Extraocular movements intact.     Conjunctiva/sclera: Conjunctivae normal.     Pupils: Pupils are equal, round, and reactive to light.  Cardiovascular:     Rate and Rhythm: Normal rate and regular rhythm.     Pulses: Normal pulses.     Heart sounds: Normal heart sounds.  Pulmonary:     Effort: Pulmonary effort is normal.     Breath sounds: Normal breath sounds.  Abdominal:     Palpations: Abdomen is soft.     Tenderness: There is no abdominal tenderness.  Musculoskeletal:     Cervical back: No tenderness.  Lymphadenopathy:     Cervical: No cervical adenopathy.  Skin:    General: Skin is warm and dry.     Capillary Refill: Capillary refill takes less than 2 seconds.  Neurological:     General: No focal deficit present.     Mental Status: He is alert and oriented to person, place, and time.  Psychiatric:        Mood and Affect: Mood normal.        Behavior: Behavior normal.    Results for orders placed or performed in visit on 11/03/21 (from the past 24 hour(s))  POCT Urinalysis Dipstick     Status: Abnormal   Collection Time: 11/03/21  3:09 PM  Result Value Ref Range   Color, UA clear    Clarity, UA     Glucose, UA Positive (A) Negative   Bilirubin, UA neg    Ketones, UA neg    Spec Grav, UA 1.010 1.010 - 1.025   Blood, UA +    pH, UA 6.0 5.0 - 8.0   Protein, UA Negative Negative   Urobilinogen, UA 0.2 0.2 or 1.0 E.U./dL   Nitrite, UA negative    Leukocytes, UA Negative Negative   Appearance     Odor      ASSESSMENT & PLAN: A total of 47 minutes was spent with the patient and counseling/coordination of care regarding preparing for this visit, reviewing most recent office visit notes, review of most recent blood work  results including today's hemoglobin A1c and urinalysis, review of all medications and addition of Ditropan, prognosis, documentation, need for follow-up in 4 weeks.  Problem List Items Addressed This Visit       Cardiovascular and Mediastinum   Hypertension associated with diabetes (HCC)    Well-controlled hypertension. BP Readings from Last 3 Encounters:  11/03/21 122/70  05/28/21 126/62  11/25/20 133/72  Well-controlled diabetes with hemoglobin A1c of 6.8.  Continue metformin 1000 mg twice a day.  Endocrine   Diabetes mellitus with no complication (HCC)   Relevant Orders   POCT HgB A1C   Dyslipidemia associated with type 2 diabetes mellitus (HCC)    Stable.  Continue rosuvastatin 10 mg daily.        Genitourinary   BPH with obstruction/lower urinary tract symptoms - Primary    Symptoms of bladder outlet obstruction improved with Cardura. We will start Ditropan for symptoms of overactive bladder. Follow-up in 4 weeks.      Relevant Medications   oxybutynin (DITROPAN XL) 5 MG 24 hr tablet     Other   Benign prostatic hyperplasia (BPH) with urinary urgency   Other Visit Diagnoses     Frequent urination       Relevant Orders   POCT Urinalysis Dipstick (Completed)      Patient Instructions  Benign Prostatic Hyperplasia Benign prostatic hyperplasia (BPH) is an enlarged prostate gland that is caused by the normal aging process. The prostate may get bigger as a man gets older. The condition is not caused by cancer. The prostate is a walnut-sized gland that is involved in the production of semen. It is located in front of the rectum and below the bladder. The bladder stores urine. The urethra carries stored urine out of the body. An enlarged prostate can press on the urethra. This can make it harder to pass urine. The buildup of urine in the bladder can cause infection. Back pressure and infection may progress to bladder damage and kidney (renal) failure. What are  the causes? This condition is part of the normal aging process. However, not all men develop problems from this condition. If the prostate enlarges away from the urethra, urine flow will not be blocked. If it enlarges toward the urethra and compresses it, there will be problems passing urine. What increases the risk? This condition is more likely to develop in men older than 50 years. What are the signs or symptoms? Symptoms of this condition include: Getting up often during the night to urinate. Needing to urinate frequently during the day. Difficulty starting urine flow. Decrease in size and strength of your urine stream. Leaking (dribbling) after urinating. Inability to pass urine. This needs immediate treatment. Inability to completely empty your bladder. Pain when you pass urine. This is more common if there is also an infection. Urinary tract infection (UTI). How is this diagnosed? This condition is diagnosed based on your medical history, a physical exam, and your symptoms. Tests will also be done, such as: A post-void bladder scan. This measures any amount of urine that may remain in your bladder after you finish urinating. A digital rectal exam. In a rectal exam, your health care provider checks your prostate by putting a lubricated, gloved finger into your rectum to feel the back of your prostate gland. This exam detects the size of your gland and any abnormal lumps or growths. An exam of your urine (urinalysis). A prostate specific antigen (PSA) screening. This is a blood test used to screen for prostate cancer. An ultrasound. This test uses sound waves to electronically produce a picture of your prostate gland. Your health care provider may refer you to a specialist in kidney and prostate diseases (urologist). How is this treated? Once symptoms begin, your health care provider will monitor your condition (active surveillance or watchful waiting). Treatment for this condition will  depend on the severity of your condition. Treatment may include: Observation and yearly exams. This may be the only treatment needed if your  condition and symptoms are mild. Medicines to relieve your symptoms, including: Medicines to shrink the prostate. Medicines to relax the muscle of the prostate. Surgery in severe cases. Surgery may include: Prostatectomy. In this procedure, the prostate tissue is removed completely through an open incision or with a laparoscope or robotics. Transurethral resection of the prostate (TURP). In this procedure, a tool is inserted through the opening at the tip of the penis (urethra). It is used to cut away tissue of the inner core of the prostate. The pieces are removed through the same opening of the penis. This removes the blockage. Transurethral incision (TUIP). In this procedure, small cuts are made in the prostate. This lessens the prostate's pressure on the urethra. Transurethral microwave thermotherapy (TUMT). This procedure uses microwaves to create heat. The heat destroys and removes a small amount of prostate tissue. Transurethral needle ablation (TUNA). This procedure uses radio frequencies to destroy and remove a small amount of prostate tissue. Interstitial laser coagulation (ILC). This procedure uses a laser to destroy and remove a small amount of prostate tissue. Transurethral electrovaporization (TUVP). This procedure uses electrodes to destroy and remove a small amount of prostate tissue. Prostatic urethral lift. This procedure inserts an implant to push the lobes of the prostate away from the urethra. Follow these instructions at home: Take over-the-counter and prescription medicines only as told by your health care provider. Monitor your symptoms for any changes. Contact your health care provider with any changes. Avoid drinking large amounts of liquid before going to bed or out in public. Avoid or reduce how much caffeine or alcohol you  drink. Give yourself time when you urinate. Keep all follow-up visits. This is important. Contact a health care provider if: You have unexplained back pain. Your symptoms do not get better with treatment. You develop side effects from the medicine you are taking. Your urine becomes very dark or has a bad smell. Your lower abdomen becomes distended and you have trouble passing urine. Get help right away if: You have a fever or chills. You suddenly cannot urinate. You feel light-headed or very dizzy, or you faint. There are large amounts of blood or clots in your urine. Your urinary problems become hard to manage. You develop moderate to severe low back or flank pain. The flank is the side of your body between the ribs and the hip. These symptoms may be an emergency. Get help right away. Call 911. Do not wait to see if the symptoms will go away. Do not drive yourself to the hospital. Summary Benign prostatic hyperplasia (BPH) is an enlarged prostate that is caused by the normal aging process. It is not caused by cancer. An enlarged prostate can press on the urethra. This can make it hard to pass urine. This condition is more likely to develop in men older than 50 years. Get help right away if you suddenly cannot urinate. This information is not intended to replace advice given to you by your health care provider. Make sure you discuss any questions you have with your health care provider. Document Revised: 03/12/2021 Document Reviewed: 03/12/2021 Elsevier Patient Education  2022 Elsevier Inc.    Edwina Barth, MD [facility]

## 2021-11-03 NOTE — Assessment & Plan Note (Signed)
Symptoms of bladder outlet obstruction improved with Cardura. We will start Ditropan for symptoms of overactive bladder. Follow-up in 4 weeks.

## 2021-11-04 LAB — POCT GLYCOSYLATED HEMOGLOBIN (HGB A1C): Hemoglobin A1C: 7.3 % — AB (ref 4.0–5.6)

## 2021-11-08 ENCOUNTER — Other Ambulatory Visit: Payer: Self-pay | Admitting: Emergency Medicine

## 2022-03-21 ENCOUNTER — Other Ambulatory Visit: Payer: Self-pay | Admitting: Emergency Medicine

## 2022-04-24 ENCOUNTER — Other Ambulatory Visit: Payer: Self-pay | Admitting: Emergency Medicine

## 2022-04-24 DIAGNOSIS — N401 Enlarged prostate with lower urinary tract symptoms: Secondary | ICD-10-CM

## 2022-04-28 ENCOUNTER — Ambulatory Visit (INDEPENDENT_AMBULATORY_CARE_PROVIDER_SITE_OTHER): Payer: Medicare Other | Admitting: Emergency Medicine

## 2022-04-28 ENCOUNTER — Encounter: Payer: Self-pay | Admitting: Emergency Medicine

## 2022-04-28 VITALS — BP 130/70 | HR 84 | Temp 97.9°F | Ht 65.0 in | Wt 124.2 lb

## 2022-04-28 DIAGNOSIS — E785 Hyperlipidemia, unspecified: Secondary | ICD-10-CM

## 2022-04-28 DIAGNOSIS — J309 Allergic rhinitis, unspecified: Secondary | ICD-10-CM

## 2022-04-28 DIAGNOSIS — E1159 Type 2 diabetes mellitus with other circulatory complications: Secondary | ICD-10-CM

## 2022-04-28 DIAGNOSIS — E1169 Type 2 diabetes mellitus with other specified complication: Secondary | ICD-10-CM

## 2022-04-28 DIAGNOSIS — I152 Hypertension secondary to endocrine disorders: Secondary | ICD-10-CM | POA: Diagnosis not present

## 2022-04-28 DIAGNOSIS — N401 Enlarged prostate with lower urinary tract symptoms: Secondary | ICD-10-CM

## 2022-04-28 DIAGNOSIS — N138 Other obstructive and reflux uropathy: Secondary | ICD-10-CM

## 2022-04-28 LAB — POCT GLYCOSYLATED HEMOGLOBIN (HGB A1C)
HbA1c POC (<> result, manual entry): 7.3 % (ref 4.0–5.6)
HbA1c, POC (controlled diabetic range): 7.3 % — AB (ref 0.0–7.0)
HbA1c, POC (prediabetic range): 7.3 % — AB (ref 5.7–6.4)
Hemoglobin A1C: 7.3 % — AB (ref 4.0–5.6)

## 2022-04-28 MED ORDER — AZELASTINE HCL 0.1 % NA SOLN: 1.0000 | Freq: Two times a day (BID) | NASAL | 5 refills | Status: AC

## 2022-04-28 NOTE — Assessment & Plan Note (Signed)
Well-controlled symptoms.  Continue Cardura 2 mg 1-1/2 tablets daily.

## 2022-04-28 NOTE — Assessment & Plan Note (Signed)
Well-controlled hypertension.  Elevated blood pressure reading in the office but normal readings at home. Continue Cardura 2 mg 1-1/2 tablet daily. Well-controlled diabetes with hemoglobin A1c of 7.3. Continue metformin 1000 mg twice a day. Cardiovascular risks associated with hypertension and diabetes discussed. Diet and patient discussed. Follow-up in 6 months.

## 2022-04-28 NOTE — Patient Instructions (Signed)
Health Maintenance After Age 81 After age 81, you are at a higher risk for certain long-term diseases and infections as well as injuries from falls. Falls are a major cause of broken bones and head injuries in people who are older than age 81. Getting regular preventive care can help to keep you healthy and well. Preventive care includes getting regular testing and making lifestyle changes as recommended by your health care provider. Talk with your health care provider about: Which screenings and tests you should have. A screening is a test that checks for a disease when you have no symptoms. A diet and exercise plan that is right for you. What should I know about screenings and tests to prevent falls? Screening and testing are the best ways to find a health problem early. Early diagnosis and treatment give you the best chance of managing medical conditions that are common after age 81. Certain conditions and lifestyle choices may make you more likely to have a fall. Your health care provider may recommend: Regular vision checks. Poor vision and conditions such as cataracts can make you more likely to have a fall. If you wear glasses, make sure to get your prescription updated if your vision changes. Medicine review. Work with your health care provider to regularly review all of the medicines you are taking, including over-the-counter medicines. Ask your health care provider about any side effects that may make you more likely to have a fall. Tell your health care provider if any medicines that you take make you feel dizzy or sleepy. Strength and balance checks. Your health care provider may recommend certain tests to check your strength and balance while standing, walking, or changing positions. Foot health exam. Foot pain and numbness, as well as not wearing proper footwear, can make you more likely to have a fall. Screenings, including: Osteoporosis screening. Osteoporosis is a condition that causes  the bones to get weaker and break more easily. Blood pressure screening. Blood pressure changes and medicines to control blood pressure can make you feel dizzy. Depression screening. You may be more likely to have a fall if you have a fear of falling, feel depressed, or feel unable to do activities that you used to do. Alcohol use screening. Using too much alcohol can affect your balance and may make you more likely to have a fall. Follow these instructions at home: Lifestyle Do not drink alcohol if: Your health care provider tells you not to drink. If you drink alcohol: Limit how much you have to: 0-1 drink a day for women. 0-2 drinks a day for men. Know how much alcohol is in your drink. In the [place] one drink equals one 12 oz bottle of beer (355 mL), one 5 oz glass of wine (148 mL), or one 1 oz glass of hard liquor (44 mL). Do not use any products that contain nicotine or tobacco. These products include cigarettes, chewing tobacco, and vaping devices, such as e-cigarettes. If you need help quitting, ask your health care provider. Activity  Follow a regular exercise program to stay fit. This will help you maintain your balance. Ask your health care provider what types of exercise are appropriate for you. If you need a cane or walker, use it as recommended by your health care provider. Wear supportive shoes that have nonskid soles. Safety  Remove any tripping hazards, such as rugs, cords, and clutter. Install safety equipment such as grab bars in bathrooms and safety rails on stairs. Keep rooms and walkways   well-lit. General instructions Talk with your health care provider about your risks for falling. Tell your health care provider if: You fall. Be sure to tell your health care provider about all falls, even ones that seem minor. You feel dizzy, tiredness (fatigue), or off-balance. Take over-the-counter and prescription medicines only as told by your health care provider. These include  supplements. Eat a healthy diet and maintain a healthy weight. A healthy diet includes low-fat dairy products, low-fat (lean) meats, and fiber from whole grains, beans, and lots of fruits and vegetables. Stay current with your vaccines. Schedule regular health, dental, and eye exams. Summary Having a healthy lifestyle and getting preventive care can help to protect your health and wellness after age 81. Screening and testing are the best way to find a health problem early and help you avoid having a fall. Early diagnosis and treatment give you the best chance for managing medical conditions that are more common for people who are older than age 81. Falls are a major cause of broken bones and head injuries in people who are older than age 81. Take precautions to prevent a fall at home. Work with your health care provider to learn what changes you can make to improve your health and wellness and to prevent falls. This information is not intended to replace advice given to you by your health care provider. Make sure you discuss any questions you have with your health care provider. Document Revised: 01/13/2021 Document Reviewed: 01/13/2021 Elsevier Patient Education  2023 Elsevier Inc.  

## 2022-04-28 NOTE — Assessment & Plan Note (Signed)
Stable.  Diet and nutrition discussed.  Continue rosuvastatin 10 mg daily.  

## 2022-04-28 NOTE — Progress Notes (Signed)
Erik Aguirre 81 y.o.   Chief Complaint  Patient presents with   Follow-up    F/u for HTN Cholesterol diabetes     HISTORY OF PRESENT ILLNESS: This is a 81 y.o. male here for 29-month follow-up of hypertension, diabetes, and dyslipidemia. Also complaining of runny nose and sneezing intermittently for the past 4 months. Overall doing well. No other complaints or medical concerns today.  HPI   Prior to Admission medications   Medication Sig Start Date End Date Taking? Authorizing Provider  doxazosin (CARDURA) 2 MG tablet TAKE 1 & 1/2 (ONE & ONE-HALF) TABLETS BY MOUTH ONCE DAILY 04/24/22  Yes Georgina Quint, MD  metFORMIN (GLUCOPHAGE) 1000 MG tablet TAKE 1 TABLET BY MOUTH TWICE DAILY WITH A MEAL 03/22/22  Yes Lakara Weiland, Eilleen Kempf, MD  Multiple Vitamin (MULTIVITAMIN) capsule Take 1 capsule by mouth daily. Reported on 01/15/2016   Yes [provider]  rosuvastatin (CRESTOR) 10 MG tablet Take 1 tablet by mouth once daily 07/24/21  Yes Alvie Speltz, Eilleen Kempf, MD    Allergies  Allergen Reactions   Lisinopril Cough   Terazosin Hcl Other (See Comments)    Severe orthostatic hypotension resulting in falls    Patient Active Problem List   Diagnosis Date Noted   Dyslipidemia associated with type 2 diabetes mellitus (HCC) 05/27/2020   Dyslipidemia 11/01/2014   Hypertension associated with diabetes (HCC) 07/02/2014   Diabetes mellitus with no complication (HCC) 07/02/2014   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   Benign prostatic hyperplasia (BPH) with urinary urgency 03/17/2013   BPH with obstruction/lower urinary tract symptoms 03/17/2013    Past Medical History:  Diagnosis Date   Acute appendicitis s/p alp appy 11/27/2014 11/26/2014   BPH (benign prostatic hyperplasia)    Hyperlipidemia    Hypertension    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    Past Surgical History:  Procedure Laterality Date   LAPAROSCOPIC APPENDECTOMY N/A 11/27/2014   Procedure: APPENDECTOMY  LAPAROSCOPIC ;  Surgeon: Karie Soda, MD;  Location: [facility];  Service: General;  Laterality: N/A;    Social History   Socioeconomic History   Marital status: Married    Spouse name: Not on file   Number of children: 5   Years of education: Not on file   Highest education level: Not on file  Occupational History   Occupation: Retired  Tobacco Use   Smoking status: Never   Smokeless tobacco: Never  Vaping Use   Vaping Use: Never used  Substance and Sexual Activity   Alcohol use: Yes    Comment: occ   Drug use: No   Sexual activity: Not on file  Other Topics Concern   Not on file  Social History Narrative   Originally from [place]  [place].   Language of English okay   Social Determinants of Health   Financial Resource Strain: Not on file  Food Insecurity: Not on file  Transportation Needs: Not on file  Physical Activity: Not on file  Stress: Not on file  Social Connections: Not on file  Intimate Partner Violence: Not on file    Family History  Problem Relation Age of Onset   Throat cancer Brother    Asthma Son      Review of Systems  Constitutional: Negative.  Negative for chills and fever.  HENT: Negative.  Negative for congestion and sore throat.   Respiratory: Negative.  Negative for cough and shortness of breath.   Cardiovascular: Negative.  Negative for chest pain and palpitations.  Gastrointestinal:  Negative for abdominal pain, diarrhea, nausea and vomiting.  Skin: Negative.  Negative for rash.  Neurological: Negative.  Negative for dizziness and headaches.  All other systems reviewed and are negative.  Today's Vitals   04/28/22 1429 04/28/22 1503  BP: (!) 168/80 130/70  Pulse: 84   Temp: 97.9 F (36.6 C)   TempSrc: Oral   SpO2: 94%   Weight: 124 lb 3.2 oz (56.3 kg)   Height: 5\' 5"  (1.651 m)    Body mass index is 20.67 kg/m.   Physical Exam Vitals reviewed.  Constitutional:      Appearance: Normal appearance.  HENT:      Head: Normocephalic.     Mouth/Throat:     Mouth: Mucous membranes are moist.     Pharynx: Oropharynx is clear.  Eyes:     Extraocular Movements: Extraocular movements intact.     Pupils: Pupils are equal, round, and reactive to light.  Cardiovascular:     Rate and Rhythm: Normal rate and regular rhythm.     Pulses: Normal pulses.     Heart sounds: Normal heart sounds.  Pulmonary:     Effort: Pulmonary effort is normal.     Breath sounds: Normal breath sounds.  Abdominal:     Palpations: Abdomen is soft.     Tenderness: There is no abdominal tenderness.  Musculoskeletal:     Cervical back: No tenderness.  Lymphadenopathy:     Cervical: No cervical adenopathy.  Skin:    General: Skin is warm and dry.     Capillary Refill: Capillary refill takes less than 2 seconds.  Neurological:     General: No focal deficit present.     Mental Status: He is alert and oriented to person, place, and time.  Psychiatric:        Mood and Affect: Mood normal.        Behavior: Behavior normal.   Results for orders placed or performed in visit on 04/28/22 (from the past 24 hour(s))  POCT glycosylated hemoglobin (Hb A1C)     Status: Abnormal   Collection Time: 04/28/22  3:00 PM  Result Value Ref Range   Hemoglobin A1C 7.3 (A) 4.0 - 5.6 %   HbA1c POC (<> result, manual entry) 7.3 4.0 - 5.6 %   HbA1c, POC (prediabetic range) 7.3 (A) 5.7 - 6.4 %   HbA1c, POC (controlled diabetic range) 7.3 (A) 0.0 - 7.0 %     ASSESSMENT & PLAN: A total of 47 minutes was spent with the patient and counseling/coordination of care regarding preparing for this visit, review of most recent office visit notes, review of most recent blood work results including today's interpretation of hemoglobin A1c, review of multiple chronic medical conditions and their management, review of all medications, education on nutrition, prognosis, documentation, need for follow-up.  Problem List Items Addressed This Visit        Cardiovascular and Mediastinum   Hypertension associated with diabetes (HCC) - Primary    Well-controlled hypertension.  Elevated blood pressure reading in the office but normal readings at home. Continue Cardura 2 mg 1-1/2 tablet daily. Well-controlled diabetes with hemoglobin A1c of 7.3. Continue metformin 1000 mg twice a day. Cardiovascular risks associated with hypertension and diabetes discussed. Diet and patient discussed. Follow-up in 6 months.        Relevant Orders   POCT glycosylated hemoglobin (Hb A1C) (Completed)   Comprehensive metabolic panel   Urine Microalbumin w/creat. ratio   CBC with Differential/Platelet  Endocrine   Dyslipidemia associated with type 2 diabetes mellitus (HCC)    Stable.  Diet and nutrition discussed. Continue rosuvastatin 10 mg daily.       Relevant Orders   Lipid panel   CBC with Differential/Platelet     Genitourinary   BPH with obstruction/lower urinary tract symptoms    Well-controlled symptoms.  Continue Cardura 2 mg 1-1/2 tablets daily.      Other Visit Diagnoses     Allergic rhinitis, unspecified seasonality, unspecified trigger       Relevant Medications   azelastine (ASTELIN) 0.1 % nasal spray      Patient Instructions  Health Maintenance After Age 81 After age 60, you are at a higher risk for certain long-term diseases and infections as well as injuries from falls. Falls are a major cause of broken bones and head injuries in people who are older than age 1. Getting regular preventive care can help to keep you healthy and well. Preventive care includes getting regular testing and making lifestyle changes as recommended by your health care provider. Talk with your health care provider about: Which screenings and tests you should have. A screening is a test that checks for a disease when you have no symptoms. A diet and exercise plan that is right for you. What should I know about screenings and tests to prevent  falls? Screening and testing are the best ways to find a health problem early. Early diagnosis and treatment give you the best chance of managing medical conditions that are common after age 59. Certain conditions and lifestyle choices may make you more likely to have a fall. Your health care provider may recommend: Regular vision checks. Poor vision and conditions such as cataracts can make you more likely to have a fall. If you wear glasses, make sure to get your prescription updated if your vision changes. Medicine review. Work with your health care provider to regularly review all of the medicines you are taking, including over-the-counter medicines. Ask your health care provider about any side effects that may make you more likely to have a fall. Tell your health care provider if any medicines that you take make you feel dizzy or sleepy. Strength and balance checks. Your health care provider may recommend certain tests to check your strength and balance while standing, walking, or changing positions. Foot health exam. Foot pain and numbness, as well as not wearing proper footwear, can make you more likely to have a fall. Screenings, including: Osteoporosis screening. Osteoporosis is a condition that causes the bones to get weaker and break more easily. Blood pressure screening. Blood pressure changes and medicines to control blood pressure can make you feel dizzy. Depression screening. You may be more likely to have a fall if you have a fear of falling, feel depressed, or feel unable to do activities that you used to do. Alcohol use screening. Using too much alcohol can affect your balance and may make you more likely to have a fall. Follow these instructions at home: Lifestyle Do not drink alcohol if: Your health care provider tells you not to drink. If you drink alcohol: Limit how much you have to: 0-1 drink a day for women. 0-2 drinks a day for men. Know how much alcohol is in your drink.  In the [place] one drink equals one 12 oz bottle of beer (355 mL), one 5 oz glass of wine (148 mL), or one 1 oz glass of hard liquor (44 mL). Do not use any  products that contain nicotine or tobacco. These products include cigarettes, chewing tobacco, and vaping devices, such as e-cigarettes. If you need help quitting, ask your health care provider. Activity  Follow a regular exercise program to stay fit. This will help you maintain your balance. Ask your health care provider what types of exercise are appropriate for you. If you need a cane or walker, use it as recommended by your health care provider. Wear supportive shoes that have nonskid soles. Safety  Remove any tripping hazards, such as rugs, cords, and clutter. Install safety equipment such as grab bars in bathrooms and safety rails on stairs. Keep rooms and walkways well-lit. General instructions Talk with your health care provider about your risks for falling. Tell your health care provider if: You fall. Be sure to tell your health care provider about all falls, even ones that seem minor. You feel dizzy, tiredness (fatigue), or off-balance. Take over-the-counter and prescription medicines only as told by your health care provider. These include supplements. Eat a healthy diet and maintain a healthy weight. A healthy diet includes low-fat dairy products, low-fat (lean) meats, and fiber from whole grains, beans, and lots of fruits and vegetables. Stay current with your vaccines. Schedule regular health, dental, and eye exams. Summary Having a healthy lifestyle and getting preventive care can help to protect your health and wellness after age 36. Screening and testing are the best way to find a health problem early and help you avoid having a fall. Early diagnosis and treatment give you the best chance for managing medical conditions that are more common for people who are older than age 89. Falls are a major cause of broken bones and  head injuries in people who are older than age 73. Take precautions to prevent a fall at home. Work with your health care provider to learn what changes you can make to improve your health and wellness and to prevent falls. This information is not intended to replace advice given to you by your health care provider. Make sure you discuss any questions you have with your health care provider. Document Revised: 01/13/2021 Document Reviewed: 01/13/2021 Elsevier Patient Education  2023 Elsevier Inc.    Edwina Barth, MD [facility]

## 2022-06-23 ENCOUNTER — Other Ambulatory Visit: Payer: Self-pay | Admitting: Emergency Medicine

## 2022-07-09 ENCOUNTER — Other Ambulatory Visit: Payer: Self-pay | Admitting: Emergency Medicine

## 2022-07-09 DIAGNOSIS — E1169 Type 2 diabetes mellitus with other specified complication: Secondary | ICD-10-CM

## 2022-07-27 ENCOUNTER — Other Ambulatory Visit: Payer: Self-pay | Admitting: Emergency Medicine

## 2022-07-27 DIAGNOSIS — R3915 Urgency of urination: Secondary | ICD-10-CM

## 2022-09-17 ENCOUNTER — Other Ambulatory Visit: Payer: Self-pay | Admitting: Emergency Medicine

## 2022-09-22 ENCOUNTER — Other Ambulatory Visit: Payer: Self-pay | Admitting: Emergency Medicine

## 2022-09-22 DIAGNOSIS — N401 Enlarged prostate with lower urinary tract symptoms: Secondary | ICD-10-CM

## 2022-09-22 NOTE — Telephone Encounter (Signed)
Thanks

## 2022-09-26 ENCOUNTER — Encounter (HOSPITAL_COMMUNITY): Payer: Self-pay

## 2022-09-26 ENCOUNTER — Ambulatory Visit (HOSPITAL_COMMUNITY)
Admission: EM | Admit: 2022-09-26 | Discharge: 2022-09-26 | Disposition: A | Discharge status: Home / Self Care | Payer: Medicare Other | Attending: Physician Assistant | Admitting: Physician Assistant

## 2022-09-26 DIAGNOSIS — M25552 Pain in left hip: Secondary | ICD-10-CM

## 2022-09-26 DIAGNOSIS — M7062 Trochanteric bursitis, left hip: Secondary | ICD-10-CM

## 2022-09-26 MED ORDER — PREDNISONE 10 MG PO TABS: 10.0000 mg | ORAL_TABLET | Freq: Three times a day (TID) | ORAL | 0 refills | Status: AC

## 2022-09-26 MED ORDER — IBUPROFEN 600 MG PO TABS: 600.0000 mg | ORAL_TABLET | Freq: Three times a day (TID) | ORAL | 0 refills | Status: AC | PRN

## 2022-09-26 NOTE — ED Triage Notes (Signed)
Pt states that 4 days ago his left hip started hurting. He states that he hasn't fell. Hasn't taken any meds for this. Verified through interpreter

## 2022-09-26 NOTE — Discharge Instructions (Signed)
Advised to use ice therapy, 10 minutes on 20 minutes off, 3-4 times throughout the evening and also alternating with heat for the next several days to help relieve discomfort and pain.  Advised take the ibuprofen 600 mg every 8 hours with food to help relieve the pain. Advised take prednisone 10 mg 3 times a day for 3 days only as this will help reduce the acute inflammatory process.  Advised follow-up PCP or return to urgent care if symptoms fail to improve.

## 2022-09-26 NOTE — ED Provider Notes (Signed)
[facility]    CSN: [id number] Arrival date & time: 09/26/22  1440      History   Chief Complaint Chief Complaint  Patient presents with   Hip Pain    HPI Erik Aguirre is a 82 y.o. male.   82 year old male presents with left hip pain.  History is being taken through interpretive services with [place] being the language.  Patient indicates 4 days ago he started having left hip pain and tenderness that is worse when he walks.  Patient indicates that he has not traumatized the hip, following, injured the hip that he recalls.  Patient indicates that he did take some Aleve but without relief from the discomfort.  Patient indicates he is not having any weakness, numbness or tingling to the left leg.  Patient indicates he has not done any activity that aggravates the left hip that he recalls.   Hip Pain    Past Medical History:  Diagnosis Date   Acute appendicitis s/p alp appy 11/27/2014 11/26/2014   BPH (benign prostatic hyperplasia)    Hyperlipidemia    Hypertension    Segmental ileitis ([place]) 11/03/2014    Patient Active Problem List   Diagnosis Date Noted   Dyslipidemia associated with type 2 diabetes mellitus ([place]) 05/27/2020   Dyslipidemia 11/01/2014   Hypertension associated with diabetes ([place]) 07/02/2014   Diabetes mellitus with no complication ([place]) 99/24/2683   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   Benign prostatic hyperplasia (BPH) with urinary urgency 03/17/2013   BPH with obstruction/lower urinary tract symptoms 03/17/2013    Past Surgical History:  Procedure Laterality Date   HERNIA REPAIR     LAPAROSCOPIC APPENDECTOMY N/A 11/27/2014   Procedure: APPENDECTOMY LAPAROSCOPIC ;  Surgeon: Michael Boston, MD;  Location: [facility];  Service: General;  Laterality: N/A;       Home Medications    Prior to Admission medications   Medication Sig Start Date End Date Taking? Authorizing Provider  azelastine (ASTELIN) 0.1 % nasal spray Place 1 spray into both  nostrils 2 (two) times daily. Use in each nostril as directed 04/28/22  Yes Sagardia, Ines Bloomer, MD  ibuprofen (ADVIL) 600 MG tablet Take 1 tablet (600 mg total) by mouth every 8 (eight) hours as needed. 09/26/22  Yes Nyoka Lint, PA-C  metFORMIN (GLUCOPHAGE) 1000 MG tablet TAKE 1 TABLET BY MOUTH TWICE DAILY WITH A MEAL 09/17/22  Yes Sagardia, Ines Bloomer, MD  Multiple Vitamin (MULTIVITAMIN) capsule Take 1 capsule by mouth daily. Reported on 01/15/2016   Yes [provider]  predniSONE (DELTASONE) 10 MG tablet Take 1 tablet (10 mg total) by mouth 3 (three) times daily. 09/26/22  Yes Nyoka Lint, PA-C  rosuvastatin (CRESTOR) 10 MG tablet Take 1 tablet by mouth once daily 07/09/22  Yes Sagardia, Ines Bloomer, MD  doxazosin (CARDURA) 2 MG tablet TAKE 1 & 1/2 (ONE & ONE-HALF) TABLETS BY MOUTH ONCE DAILY 09/22/22   Horald Pollen, MD    Family History Family History  Problem Relation Age of Onset   Throat cancer Brother    Asthma Son     Social History Social History   Tobacco Use   Smoking status: Never   Smokeless tobacco: Never  Vaping Use   Vaping Use: Never used  Substance Use Topics   Alcohol use: Yes    Comment: occ   Drug use: No     Allergies   Lisinopril and Terazosin hcl   Review of Systems Review of Systems  Musculoskeletal:  Positive for  gait problem (left hip pain).     Physical Exam Triage Vital Signs ED Triage Vitals  Enc Vitals Group     BP 09/26/22 1555 (!) 171/85     Pulse Rate 09/26/22 1555 65     Resp 09/26/22 1555 14     Temp 09/26/22 1555 97.9 F (36.6 C)     Temp Source 09/26/22 1555 Oral     SpO2 09/26/22 1555 95 %     Weight --      Height --      Head Circumference --      Peak Flow --      Pain Score 09/26/22 1553 3     Pain Loc --      Pain Edu? --      Excl. in GC? --    No data found.  Updated Vital Signs BP (!) 171/85 (BP Location: Left Arm)   Pulse 65   Temp 97.9 F (36.6 C) (Oral)   Resp 14   SpO2 95%    Visual Acuity Right Eye Distance:   Left Eye Distance:   Bilateral Distance:    Right Eye Near:   Left Eye Near:    Bilateral Near:     Physical Exam Constitutional:      Appearance: Normal appearance.  Musculoskeletal:       Legs:     Comments: Left hip: Pain is palpated along the superior and posterior aspect of the hip at the trochanteric area.  Range of motion is normal without pain on external rotation.  There is mild pain on internal rotation with stressing.  There is no crepitus with range of motion or limitation. The external skin is clear without any unusual redness or rash.  Neurological:     Mental Status: He is alert.      UC Treatments / Results  Labs (all labs ordered are listed, but only abnormal results are displayed) Labs Reviewed - No data to display  EKG   Radiology No results found.  Procedures Procedures (including critical care time)  Medications Ordered in UC Medications - No data to display  Initial Impression / Assessment and Plan / UC Course  I have reviewed the triage vital signs and the nursing notes.  Pertinent labs & imaging results that were available during my care of the patient were reviewed by me and considered in my medical decision making (see chart for details).    Plan: The diagnosis will be treated with the following: 1.  The left hip pain: A.  Ibuprofen 600 mg every 8 hours with food to help relieve the pain. 2.  The trochanteric bursitis of left hip: A.  Prednisone 10 mg 3 times a day for 3 days only to help relieve acute inflammation. B.  Advised alternating ice and heat therapy 3-4 times a day 10 minutes each session. 3.  Advised follow-up PCP or return to urgent care if symptoms fail to improve. Final Clinical Impressions(s) / UC Diagnoses   Final diagnoses:  Left hip pain  Trochanteric bursitis of left hip     Discharge Instructions      Advised to use ice therapy, 10 minutes on 20 minutes off, 3-4  times throughout the evening and also alternating with heat for the next several days to help relieve discomfort and pain.  Advised take the ibuprofen 600 mg every 8 hours with food to help relieve the pain. Advised take prednisone 10 mg 3 times a day for 3 days  only as this will help reduce the acute inflammatory process.  Advised follow-up PCP or return to urgent care if symptoms fail to improve.    ED Prescriptions     Medication Sig Dispense Auth. Provider   ibuprofen (ADVIL) 600 MG tablet Take 1 tablet (600 mg total) by mouth every 8 (eight) hours as needed. 30 tablet Nyoka Lint, PA-C   predniSONE (DELTASONE) 10 MG tablet Take 1 tablet (10 mg total) by mouth 3 (three) times daily. 10 tablet Nyoka Lint, PA-C      PDMP not reviewed this encounter.   Nyoka Lint, PA-C 09/26/22 1626

## 2022-10-05 ENCOUNTER — Telehealth: Payer: Self-pay

## 2022-10-05 NOTE — Telephone Encounter (Signed)
Left message for patient to call back to schedule Medicare Annual Wellness Visit.  No hx of AWV eligible as of 09/07/09  Please schedule at anytime with [facility] if patient calls the office back.    30 minute appointment.  Any questions, please call me at [phone]

## 2022-11-11 ENCOUNTER — Encounter: Payer: Self-pay | Admitting: Emergency Medicine

## 2022-11-11 ENCOUNTER — Ambulatory Visit (INDEPENDENT_AMBULATORY_CARE_PROVIDER_SITE_OTHER): Payer: Medicare Other

## 2022-11-11 ENCOUNTER — Ambulatory Visit (INDEPENDENT_AMBULATORY_CARE_PROVIDER_SITE_OTHER): Payer: Medicare Other | Admitting: Emergency Medicine

## 2022-11-11 VITALS — BP 124/76 | HR 67 | Temp 98.4°F | Ht 65.0 in | Wt 126.4 lb

## 2022-11-11 DIAGNOSIS — E1169 Type 2 diabetes mellitus with other specified complication: Secondary | ICD-10-CM | POA: Diagnosis not present

## 2022-11-11 DIAGNOSIS — R051 Acute cough: Secondary | ICD-10-CM | POA: Diagnosis not present

## 2022-11-11 DIAGNOSIS — N401 Enlarged prostate with lower urinary tract symptoms: Secondary | ICD-10-CM

## 2022-11-11 DIAGNOSIS — N138 Other obstructive and reflux uropathy: Secondary | ICD-10-CM

## 2022-11-11 DIAGNOSIS — E1159 Type 2 diabetes mellitus with other circulatory complications: Secondary | ICD-10-CM | POA: Diagnosis not present

## 2022-11-11 DIAGNOSIS — I152 Hypertension secondary to endocrine disorders: Secondary | ICD-10-CM

## 2022-11-11 DIAGNOSIS — R059 Cough, unspecified: Secondary | ICD-10-CM | POA: Diagnosis not present

## 2022-11-11 DIAGNOSIS — E785 Hyperlipidemia, unspecified: Secondary | ICD-10-CM

## 2022-11-11 LAB — POCT GLYCOSYLATED HEMOGLOBIN (HGB A1C): Hemoglobin A1C: 7.7 % — AB (ref 4.0–5.6)

## 2022-11-11 NOTE — Assessment & Plan Note (Signed)
Well-controlled hypertension. BP Readings from Last 3 Encounters:  11/11/22 124/76  09/26/22 (!) 171/85  04/28/22 130/70  Taking Cardura 3 mg daily for BPH which is also helping control blood pressure. Well-controlled diabetes with hemoglobin A1c at 7.7.  Acceptable number for his age. Continue metformin 1000 mg twice a day Cardiovascular risks associated with hypertension and diabetes discussed Diet and nutrition discussed.

## 2022-11-11 NOTE — Patient Instructions (Signed)
B?nh ti?u ???ng v dinh d??ng, ng??i l?n Diabetes Mellitus and Nutrition, Adult Khi qu v? b? ti?u ???ng hay b?nh ti?u ???ng, ?i?u r?t quan tr?ng l ph?i c thi quen ?n u?ng t?t cho s?c kh?e b?i v nh?ng g qu v? ?n v u?ng ?nh h??ng l?n ??n n?ng ?? ???ng huy?t (glucose) c?a qu v?. ?n th?c ph?m t?t cho s?c kh?e v?i s? l??ng v?a ph?i, vo cng cc th?i ?i?m m?i ngy, c th? gip qu v?: Qu?n l ???ng huy?t. Gi?m nguy c? b? b?nh tim. C?i thi?n huy?t p. ??t ???c ho?c duy tr m??c cn n?ng c l?i cho s?c kh?e. ?i?u g c th? ?nh h??ng ??n k? ho?ch ?n u?ng c?a ti? M?i ng??i b? ti?u ???ng khc nhau v m?i ng??i ??u c nhu c?u v? ch??ng trnh ?n u?ng khc nhau. Chuyn gia ch?m [place] s?c kh?e c?a qu v? c th? [place] qu v? trao ??i v?i chuyn gia dinh d??ng ?? xy d?ng ch??ng trnh ?n u?ng t?t nh?t cho qu v?. Ch??ng trnh ?n u?ng c?a qu v? c th? thay ??i ty thu?c vo cc y?u t? nh?: L??ng calo quy? vi? c?n. Cc lo?i thu?c m quy? vi? du?ng. Cn n??ng cu?a quy? vi?. N?ng ?? ???ng huy?t, huy?t p v cholesterol c?a qu v?. M?c ?? ho?t ??ng c?a qu v?. Cc tnh tr?ng s?c kh?e khc m qu v? c, ch?ng h?n nh? b?nh tim ho?c b?nh th?n. Carbohydrate ?nh h??ng nh? th? no ??n ti? Carbohydrate, hay cn g?i l carb, ?nh h??ng ??n l??ng ???ng huy?t c?a quy? vi? nhi?u h?n b?t k? lo?i th?c ph?m no khc. ?n carb lm t?ng l??ng glucose trong mu. Bi?t l??ng carb m qu v? c th? ?n m?t cch an ton trong m?i b?a ?n c vai tr quan tr?ng. L??ng carbohydrate ny khc nhau v?i m?i ng??i. Chuyn gia dinh d??ng c?a qu v? c th? gip tnh ton l??ng carb m qu v? c?n ph?i ?n vo m?i b?a ?n chnh v m?i b?a ?n nh?. R??u ?nh h??ng ??n ti nh? th? no? R??u c th? gy gi?m ???ng huy?t (h? ???ng huy?t), ??c bi?t l n?u qu v? s? d?ng insulin ho?c u?ng m?t s? lo?i thu?c nh?t ??nh ?i?u tr? ti?u ???ng. H? ???ng huy?t c th? l m?t tnh tr?ng ?e d?a tnh ma?ng. Cc tri?u ch?ng c?a h? ???ng huy?t, ch?ng h?n bu?n ng?, chng  m?t v l l?n, t??ng t? cc tri?u ch?ng c?a vi?c u?ng qu nhi?u r??u. Khng u?ng r??u n?u: Chuyn gia ch?m [place] s?c kh?e khuyn qu v? khng u?ng r??u. Qu v? c [place], c th? c [place], ho?c c k? ho?ch c [place]. N?u qu v? u?ng r??u: Gi?i h?n l??ng r??u qu v? u?ng ? m?c: 0-1 ly/ngy ??i v?i n? gi?i. 0-2 ly/ngy ??i v?i nam gi?i. Bi?t m?t ly c bao nhiu r??u. ? M?, m?t ly t??ng ???ng v?i m?t chai bia 12 ao-x? (355 mL), m?t ly r??u vang 5 ao-x? (148 mL), ho?c m?t ly r??u m?nh 1 ao-x? (44 mL). Lun b ?? n??c b?ng n??c, soda dnh cho ng??i ?n [place], ho?c tr ? khng ???ng. Orlene Plum nh? r?ng soda thng th???ng, n??c tri cy v ca?c ?? u?ng h?n h??p khc c th? ch?a nhi?u ???ng v ph?i ???c tnh l carb. C nh?ng l?i khuyn no ?? tun th? k? ho?ch ny?  ??c nhn th?c ph?m B?t ??u b?ng vi?c ki?m tra kch c? kh?u ph?n trn nhn Thng tin dinh d??ng c?a th?c  ph?m v ?? u?ng ?ng gi. S? calo v l??ng carb, ch?t bo v cc ch?t dinh d??ng khc ghi trn nhn d?a trn m?t kh?u ph?n c?a mn ?. Nhi?u mn ch?a nhi?u h?n m?t kh?u ph?n trn m?i bao b. Ki?m tra t?ng s? gram (g) carb trong m?t kh?u ph?n. Ki?m tra s? gram ch?t bo bo ha v ch?t bo chuy?n ha trong m?t kh?u ph?n. Ch?n th?c ph?m c t ho?c khng c nh?ng ch?t bo ny. Ki?m tra s? miligam (mg) mu?i (natri) trong m?t kh?u ph?n. H?u h?t m?i ng??i ??u nn gi?i h?n t?ng natri tiu thu? ?? m??c d??i 2.300 mg m?i ngy. Lun ki?m tra thng tin dinh d??ng c?a th?c ph?m ghi trn nhn l "t bo" hay "khng bo". Nh?ng th?c ph?m ny c th? c b? sung ???ng ho?c carb tinh luy?n cao h?n v c?n ph?i trnh. Hy trao ??i v?i chuyn gia dinh d??ng c?a qu v? ?? xc ??nh m?c tiu hng ngy c?a qu v? ??i v?i cc ch?t dinh d??ng li?t k trn nhn. Mua s?m Trnh mua nh?ng th?c ph?m ?ng h?p, lm s?n ho?c ch? bi?n s?n. Nh?ng th?c ph?m ny c xu h??ng c nhi?u ch?t bo, natri, b? sung ???ng. Mua s?m quanh ra ngoi c?a c?a hng t?p ha. ?y l n?i qu v? s? d?  tm th?y tri cy v rau c? t??i, nhi?u lo?i h?t, th?t t??i v s?n ph?m s?a t??i. N?u n??ng S? d?ng ca?c ph??ng php n?u ?n nhi?t ?? th?p, ch?ng h?n nh? n??ng, thay v ph??ng php n?u nhi?t ?? cao nh? chin ng?p d?u. N?u ?n b?ng cch s? d?ng cc lo?i d?u t?t cho s?c kh?e, ch?ng h?n nh? d?u  liu, d?u h?t c?i d?u ho?c d?u h??ng d??ng. Hessie Diener n?u v?i b?, kem, ho?c th?t nhi?u m?. Ln k? ho?ch cho b?a ?n ?n cc b?a ?n chnh v cc b?a ?n nh? ??u ??n, t?t nh?t l vo cng m?t th?i ?i?m m?i ngy. Trnh nhi?n ?n trong th?i [employer]. ?n th?c ?n giu ch?t x? nh? cc lo?i tri cy t??i, rau c?, ??u v ng? c?c nguyn cm. ?n 4-6 ao-x? (oz) (112-168 g) protein n?c m?i ngy, ch?ng h?n nh? th?t n?c, th?t g, c, tr?ng, ho?c ??u ph?. M?t ao-x? (oz) (28 g) protein n?c b?ng: 1 ao-x? (oz) (28 g) th?t, th?t g ho?c c. 1 qu? tr?ng. 1?4 c?c (62 g) ??u ph?. ?n m?t s? th?c ph?m ch?a cc ch?t bo lnh m?nh m?i ngy, ch?ng h?n nh? qu? b?, qu? h?ch, cc lo?i h?t v c. Ti nn ?n nh?ng th?c ph?m no? Tri cy Qu? m?ng. To. CamSander Nephew? ?o. Qu? m?. Qu? m?n. [place]. Qu? ?u ??Sander Nephew? l?u. Qu? kiwi. Qu? anh ?o. Marlou Starks c? Toney Reil, bao g?m c?i xo?n, rau bina, kale, c?i c?u v?ng, c?i r? c?i b? xanh v c?i b?p. C? c?i ???ng. Sp l?. Bng c?i xanh. C r?t. ??u xanh. C chua. ?t ng?t. Hnh ty. D?a chu?t. C?i bruxen. Ng? c?c Ng? c?c nguyn ca?m nh? bnh m nguyn ca?m ho??c nguyn h?t, bnh quy gin, bnh m ng, ng? c?c v m ?ng. B?t y?n m?ch khng ???ng. H?t dim m?ch (quinoa). G?o l?c ho?c g?o khng xt. Th?t v cc protein khc H?i s?n. Th?t gia c?m khng da. Mi?ng n?c th?t gia c?m v th?t b. ??u ph?. Qu? h?ch. Cc lo?i h?t. S?a Cc s?n ph?m s??a khng co? ho??c co? i?t ch?t be?o, ch?ng h?n nh? s?a, s??a  chua va? pho mt. Nh?ng th?c ph?m li?t k ? trn c th? khng ph?i l m?t danh m?c ??y ?? cc th?c ph?m v ?? u?ng m qu v? c th? ?n v u?ng. Lin h? v?i chuyn gia dinh d??ng ?? c thm thng tin. Ti nn trnh  nh?ng th?c ph?m no? Tri cy Tri cy ?ng h?p xi-r. Marlou Starks c? Marlou Starks c? ?ng h?p. Rau ?ng l?nh v?i b? ho?c n??c s?t kem. Ng? c?c Cc s?n ph?m lm t? b?t m tr?ng v b?t m ???c tinh ch? nh? bnh m, m ?ng, ?? ?n nh? v ng? c?c. Trnh t?t c? cc th?c ph?m ch? bi?n s?n. Th?t v cc protein khc Cc la?t thi?t m??. Th?t gia c?m c da. Th?t chin/rn ho?c t?m b?t rn. Th?t ch? bi?n s?n. Trnh cc ch?t bo bo ha. S?a S?a chua nguyn ch?t bo, pho mt ho?c s?a. ?? u?ng ?? u?ng c ???ng, ch?ng h?n nh? soda ho?c tr ?Marland Kitchen Nh?ng th?c ph?m li?t k ? trn c th? khng ph?i l m?t danh m?c ??y ?? cc lo?i th?c ph?m v ?? u?ng m qu v? nn trnh. Lin h? v?i chuyn gia dinh d??ng ?? c thm thng tin. Ca?c cu h?i c?n ??a ra v?i chuyn gia ch?m [place] s?c kh?e Ti c c?n g?p chuyn gia gio d?c v ch?m [place] b?nh ti?u ???ng c ch?ng nh?n khng? Ti c c?n g?p chuyn gia dinh d??ng khng? Ti co? th? go?i cho s? na?o n?u ti co? th?c m?c? Khi no l th?i ?i?m ki?m tra ???ng huy?t t?t nh?t? N?i ?? tm thm thng tin: American Diabetes Association (Hi?p h?i Ti?u ????ng [place]?): [email or web address] Academy of Nutrition and Dietetics ([place] d??ng v ?n u?ng): [employer] of Diabetes and Digestive and Kidney Diseases (Vi?n Ti?u ???ng v cc B?nh Tiu ha v B?nh Th?n Qu?c gia): [email or web address] Association of Diabetes Care & Education Specialists (Hi?p h?i Chuyn gia Gio d?c v Ch?m [place] B?nh ti?u ???ng): [email or web address] Tm t?t ?i?u r?t quan tr?ng l ph?i c thi quen ?n u?ng t?t cho s?c kh?e b?i v nh?ng g qu v? ?n v u?ng ?nh h??ng l?n ??n n?ng ?? ???ng huy?t (glucose) c?a qu v?. ?i?u quan tr?ng l s? d?ng m?t r??u cch th?n tr?ng. M?t k? ho?ch ?n u?ng t?t cho s?c kh?e s? gip qu v? ki?m sot ???ng huy?t v gi?m nguy c? m?c b?nh tim. Chuyn gia ch?m [place] s?c kh?e c?a qu v? c th? [place] qu v? trao ??i v?i chuyn gia dinh d??ng ?? xy d?ng ch??ng trnh ?n u?ng t?t nh?t cho qu  v?. Thng tin ny khng nh?m m?c ?ch thay th? cho l?i khuyn m chuyn gia ch?m [place] s?c kh?e ni v?i qu v?. Hy b?o ??m qu v? ph?i th?o lu?n b?t k? v?n ?? g m qu v? c v?i chuyn gia ch?m [place] s?c kh?e c?a qu v?. Document Revised: 01/23/2021 Document Reviewed: 01/23/2021 Elsevier Patient Education  [place].

## 2022-11-11 NOTE — Assessment & Plan Note (Signed)
Stable and well-controlled. Continue Cardura 3 mg daily

## 2022-11-11 NOTE — Assessment & Plan Note (Signed)
Most likely secondary to viral upper respiratory infection Slowly improving No signs of pneumonia Cough management discussed May continue over-the-counter Mucinex DM

## 2022-11-11 NOTE — Assessment & Plan Note (Signed)
Diet and nutrition discussed. Continue rosuvastatin 10 mg daily.

## 2022-11-11 NOTE — Progress Notes (Signed)
Erik Aguirre 82 y.o.   Chief Complaint  Patient presents with   Follow-up    F/u appt, patient had a cough for about 3 week     HISTORY OF PRESENT ILLNESS: This is a 82 y.o. male here for 56-monthfollow-up of diabetes and dyslipidemia and hypertension Also concerned about persistent cough for 3 weeks No other complaints or medical concerns today.  HPI   Prior to Admission medications   Medication Sig Start Date End Date Taking? Authorizing Provider  azelastine (ASTELIN) 0.1 % nasal spray Place 1 spray into both nostrils 2 (two) times daily. Use in each nostril as directed 04/28/22  Yes Bluma Buresh, MInes Bloomer MD  doxazosin (CARDURA) 2 MG tablet TAKE 1 & 1/2 (ONE & ONE-HALF) TABLETS BY MOUTH ONCE DAILY 09/22/22  Yes Abdo Denault, MInes Bloomer MD  ibuprofen (ADVIL) 600 MG tablet Take 1 tablet (600 mg total) by mouth every 8 (eight) hours as needed. 09/26/22  Yes JNyoka Lint PA-C  metFORMIN (GLUCOPHAGE) 1000 MG tablet TAKE 1 TABLET BY MOUTH TWICE DAILY WITH A MEAL 09/17/22  Yes Kayne Yuhas, MInes Bloomer MD  Multiple Vitamin (MULTIVITAMIN) capsule Take 1 capsule by mouth daily. Reported on 01/15/2016   Yes [provider]  predniSONE (DELTASONE) 10 MG tablet Take 1 tablet (10 mg total) by mouth 3 (three) times daily. 09/26/22  Yes JNyoka Lint PA-C  rosuvastatin (CRESTOR) 10 MG tablet Take 1 tablet by mouth once daily 07/09/22  Yes Kensley Lares, MInes Bloomer MD    Allergies  Allergen Reactions   Lisinopril Cough   Terazosin Hcl Other (See Comments)    Severe orthostatic hypotension resulting in falls    Patient Active Problem List   Diagnosis Date Noted   Acute cough 11/11/2022   Dyslipidemia associated with type 2 diabetes mellitus ([place] 05/27/2020   Dyslipidemia 11/01/2014   Hypert[place] with diabetes ([place] 07/02/2014   Diabetes mellitus with no complication ([place] 10000000  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   Benign prostatic hyperplasia (BPH) with urinary urgency  03/17/2013   BPH with obstruction/lower urinary tract symptoms 03/17/2013    Past Medical History:  Diagnosis Date   Acute appendicitis s/p alp appy 11/27/2014 11/26/2014   BPH (benign prostatic hyperplasia)    Hyperlipidemia    Hypertension    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    Past Surgical History:  Procedure Laterality Date   HERNIA REPAIR     LAPAROSCOPIC APPENDECTOMY N/A 11/27/2014   Procedure: APPENDECTOMY LAPAROSCOPIC ;  Surgeon: SMichael Boston MD;  Location: [facility];  Service: General;  Laterality: N/A;    Social History   Socioeconomic History   Marital status: Married    Spouse name: Not on file   Number of children: 5   Years of education: Not on file   Highest education level: Not on file  Occupational History   Occupation: Retired  Tobacco Use   Smoking status: Never   Smokeless tobacco: Never  Vaping Use   Vaping Use: Never used  Substance and Sexual Activity   Alcohol use: Yes    Comment: occ   Drug use: No   Sexual activity: Not Currently  Other Topics Concern   Not on file  Social History Narrative   [place]   Language of English okay   Social Determinants of Health   Financial Resource Strain: Not on file  Food Insecurity: Not on file  Transportation Needs: Not on file  Physical Activity: Not on file  Stress:  Not on file  Social Connections: Not on file  Intimate Partner Violence: Not on file    Family History  Problem Relation Age of Onset   Throat cancer Brother    Asthma Son      Review of Systems  Constitutional: Negative.   HENT: Negative.  Negative for congestion and sore throat.   Respiratory:  Positive for cough. Negative for hemoptysis, sputum production and shortness of breath.   Cardiovascular: Negative.  Negative for chest pain and palpitations.  Gastrointestinal: Negative.  Negative for abdominal pain, blood in stool, diarrhea, melena, nausea and vomiting.  Genitourinary:  Negative.  Negative for dysuria and hematuria.  Skin: Negative.  Negative for rash.  Neurological: Negative.  Negative for dizziness and headaches.  All other systems reviewed and are negative.   Today's Vitals   11/11/22 1443  BP: 124/76  Pulse: 67  Temp: 98.4 F (36.9 C)  TempSrc: Oral  SpO2: 97%  Weight: 126 lb 6 oz (57.3 kg)  Height: '5\' 5"'$  (1.651 m)   Body mass index is 21.03 kg/m.  Physical Exam Vitals reviewed.  Constitutional:      Appearance: Normal appearance.  HENT:     Head: Normocephalic.     Mouth/Throat:     Mouth: Mucous membranes are moist.     Pharynx: Oropharynx is clear.  Eyes:     Extraocular Movements: Extraocular movements intact.     Conjunctiva/sclera: Conjunctivae normal.     Pupils: Pupils are equal, round, and reactive to light.  Cardiovascular:     Rate and Rhythm: Normal rate and regular rhythm.     Pulses: Normal pulses.     Heart sounds: Normal heart sounds.  Pulmonary:     Effort: Pulmonary effort is normal.     Breath sounds: Normal breath sounds.  Abdominal:     Palpations: Abdomen is soft.     Tenderness: There is no abdominal tenderness.  Musculoskeletal:     Cervical back: No tenderness.  Lymphadenopathy:     Cervical: No cervical adenopathy.  Skin:    General: Skin is warm and dry.     Capillary Refill: Capillary refill takes less than 2 seconds.  Neurological:     General: No focal deficit present.     Mental Status: He is alert and oriented to person, place, and time.  Psychiatric:        Mood and Affect: Mood normal.        Behavior: Behavior normal.     DG Chest 2 View  Result Date: 11/11/2022 CLINICAL DATA:  Coughing for 3 weeks. EXAM: CHEST - 2 VIEW COMPARISON:  PA chest and left rib radiographs 12/08/2017 FINDINGS: Cardiac silhouette and mediastinal contours are within normal limits. Mild-to-moderate calcification within the aortic arch. Mild bilateral costophrenic angle linear scarring is unchanged. No acute  airspace opacity. No pleural effusion or pneumothorax. Mild multilevel degenerative disc changes of the thoracic spine. Interval healing of the previously seen lateral left rib fractures. IMPRESSION: No active cardiopulmonary disease. Electronically Signed   By: Yvonne Kendall M.D.   On: 11/11/2022 15:29   Results for orders placed or performed in visit on 11/11/22 (from the past 24 hour(s))  POCT HgB A1C     Status: Abnormal   Collection Time: 11/11/22  3:37 PM  Result Value Ref Range   Hemoglobin A1C 7.7 (A) 4.0 - 5.6 %   HbA1c POC (<> result, manual entry)     HbA1c, POC (prediabetic range)  HbA1c, POC (controlled diabetic range)      ASSESSMENT & PLAN: A total of 48 minutes was spent with the patient and counseling/coordination of care regarding preparing for this visit, review of most recent office visit notes, review of most recent blood work results including interpretation of today's hemoglobin A1c, review of today's chest x-ray report, review of all medications, education on nutrition, cardiovascular risk associated with hypertension, diabetes, dyslipidemia, prognosis, documentation and need for follow-up.  Problem List Items Addressed This Visit       Cardiovascular and Mediastinum   Hypertension associated with diabetes ([place]) - Primary    Well-controlled hypertension. BP Readings from Last 3 Encounters:  11/11/22 124/76  09/26/22 (!) 171/85  04/28/22 130/70  Taking Cardura 3 mg daily for BPH which is also helping control blood pressure. Well-controlled diabetes with hemoglobin A1c at 7.7.  Acceptable number for his age. Continue metformin 1000 mg twice a day Cardiovascular risks associated with hypertension and diabetes discussed Diet and nutrition discussed.       Relevant Orders   POCT HgB A1C (Completed)     Endocrine   Dyslipidemia associated with type 2 diabetes mellitus ([place])    Diet and nutrition discussed. Continue rosuvastatin 10 mg daily.          Genitourinary   BPH with obstruction/lower urinary tract symptoms    Stable and well-controlled. Continue Cardura 3 mg daily        Other   Acute cough    Most likely secondary to viral upper respiratory infection Slowly improving No signs of pneumonia Cough management discussed May continue over-the-counter Mucinex DM      Relevant Orders   DG Chest 2 View (Completed)   Patient Instructions  B?nh ti?u ???ng v dinh d??ng, ng??i l?n Diabetes Mellitus and Nutrition, Adult Khi qu v? b? ti?u ???ng hay b?nh ti?u ???ng, ?i?u r?t quan tr?ng l ph?i c thi quen ?n u?ng t?t cho s?c kh?e b?i v nh?ng g qu v? ?n v u?ng ?nh h??ng l?n ??n n?ng ?? ???ng huy?t (glucose) c?a qu v?. ?n th?c ph?m t?t cho s?c kh?e v?i s? l??ng v?a ph?i, vo cng cc th?i ?i?m m?i ngy, c th? gip qu v?: Qu?n l ???ng huy?t. Gi?m nguy c? b? b?nh tim. C?i thi?n huy?t p. ??t ???c ho?c duy tr m??c cn n?ng c l?i cho s?c kh?e. ?i?u g c th? ?nh h??ng ??n k? ho?ch ?n u?ng c?a ti? M?i ng??i b? ti?u ???ng khc nhau v m?i ng??i ??u c nhu c?u v? ch??ng trnh ?n u?ng khc nhau. Chuyn gia ch?m [place] s?c kh?e c?a qu v? c th? [place] qu v? trao ??i v?i chuyn gia dinh d??ng ?? xy d?ng ch??ng trnh ?n u?ng t?t nh?t cho qu v?. Ch??ng trnh ?n u?ng c?a qu v? c th? thay ??i ty thu?c vo cc y?u t? nh?: L??ng calo quy? vi? c?n. Cc lo?i thu?c m quy? vi? du?ng. Cn n??ng cu?a quy? vi?. N?ng ?? ???ng huy?t, huy?t p v cholesterol c?a qu v?. M?c ?? ho?t ??ng c?a qu v?. Cc tnh tr?ng s?c kh?e khc m qu v? c, ch?ng h?n nh? b?nh tim ho?c b?nh th?n. Carbohydrate ?nh h??ng nh? th? no ??n ti? Carbohydrate, hay cn g?i l carb, ?nh h??ng ??n l??ng ???ng huy?t c?a quy? vi? nhi?u h?n b?t k? lo?i th?c ph?m no khc. ?n carb lm t?ng l??ng glucose trong mu. Bi?t l??ng carb m qu v? c th? ?n m?t cch an ton trong m?i  b?a ?n c vai tr quan tr?ng. L??ng carbohydrate ny khc nhau v?i m?i ng??i. Chuyn gia dinh  d??ng c?a qu v? c th? gip tnh ton l??ng carb m qu v? c?n ph?i ?n vo m?i b?a ?n chnh v m?i b?a ?n nh?. R??u ?nh h??ng ??n ti nh? th? no? R??u c th? gy gi?m ???ng huy?t (h? ???ng huy?t), ??c bi?t l n?u qu v? s? d?ng insulin ho?c u?ng m?t s? lo?i thu?c nh?t ??nh ?i?u tr? ti?u ???ng. H? ???ng huy?t c th? l m?t tnh tr?ng ?e d?a tnh ma?ng. Cc tri?u ch?ng c?a h? ???ng huy?t, ch?ng h?n bu?n ng?, chng m?t v l l?n, t??ng t? cc tri?u ch?ng c?a vi?c u?ng qu nhi?u r??u. Khng u?ng r??u n?u: Chuyn gia ch?m [place] s?c kh?e khuyn qu v? khng u?ng r??u. Qu v? c [place] c th? c [place], ho?c c k? ho?ch c [place]. N?u qu v? u?ng r??u: Gi?i h?n l??ng r??u qu v? u?ng ? m?c: 0-1 ly/ngy ??i v?i n? gi?i. 0-2 ly/ngy ??i v?i nam gi?i. Bi?t m?t ly c bao nhiu r??u. ? M?, m?t ly t??ng ???ng v?i m?t chai bia 12 ao-x? (355 mL), m?t ly r??u vang 5 ao-x? (148 mL), ho?c m?t ly r??u m?nh 1 ao-x? (44 mL). Lun b ?? n??c b?ng n??c, soda dnh cho ng??i ?n king, ho?c tr ? khng ???ng. Orlene Plum nh? r?ng soda thng th???ng, n??c tri cy v ca?c ?? u?ng h?n h??p khc c th? ch?a nhi?u ???ng v ph?i ???c tnh l carb. C nh?ng l?i khuyn no ?? tun th? k? ho?ch ny?  ??c nhn th?c ph?m B?t ??u b?ng vi?c ki?m tra kch c? kh?u ph?n trn nhn Thng tin dinh d??ng c?a th?c ph?m v ?? u?ng ?ng gi. S? calo v l??ng carb, ch?t bo v cc ch?t dinh d??ng khc ghi trn nhn d?a trn m?t kh?u ph?n c?a mn ?. Nhi?u mn ch?a nhi?u h?n m?t kh?u ph?n trn m?i bao b. Ki?m tra t?ng s? gram (g) carb trong m?t kh?u ph?n. Ki?m tra s? gram ch?t bo bo ha v ch?t bo chuy?n ha trong m?t kh?u ph?n. Ch?n th?c ph?m c t ho?c khng c nh?ng ch?t bo ny. Ki?m tra s? miligam (mg) mu?i (natri) trong m?t kh?u ph?n. H?u h?t m?i ng??i ??u nn gi?i h?n t?ng natri tiu thu? ?? m??c d??i 2.300 mg m?i ngy. Lun ki?m tra thng tin dinh d??ng c?a th?c ph?m ghi trn nhn l "t bo" hay "khng bo". Nh?ng th?c ph?m ny c th? c  b? sung ???ng ho?c carb tinh luy?n cao h?n v c?n ph?i trnh. Hy trao ??i v?i chuyn gia dinh d??ng c?a qu v? ?? xc ??nh m?c tiu hng ngy c?a qu v? ??i v?i cc ch?t dinh d??ng li?t k trn nhn. Mua s?m Trnh mua nh?ng th?c ph?m ?ng h?p, lm s?n ho?c ch? bi?n s?n. Nh?ng th?c ph?m ny c xu h??ng c nhi?u ch?t bo, natri, b? sung ???ng. Mua s?m quanh ra ngoi c?a c?a hng t?p ha. ?y l n?i qu v? s? d? tm th?y tri cy v rau c? t??i, nhi?u lo?i h?t, th?t t??i v s?n ph?m s?a t??i. N?u n??ng S? d?ng ca?c ph??ng php n?u ?n nhi?t ?? th?p, ch?ng h?n nh? n??ng, thay v ph??ng php n?u nhi?t ?? cao nh? chin ng?p d?u. N?u ?n b?ng cch s? d?ng cc lo?i d?u t?t cho s?c kh?e, ch?ng h?n nh? d?u  liu, d?u h?t c?i d?u ho?c d?u h??ng d??ng.  Hessie Diener n?u v?i b?, kem, ho?c th?t nhi?u m?. Ln k? ho?ch cho b?a ?n ?n cc b?a ?n chnh v cc b?a ?n nh? ??u ??n, t?t nh?t l vo cng m?t th?i ?i?m m?i ngy. Trnh nhi?n ?n trong th?i [employer]. ?n th?c ?n giu ch?t x? nh? cc lo?i tri cy t??i, rau c?, ??u v ng? c?c nguyn cm. ?n 4-6 ao-x? (oz) (112-168 g) protein n?c m?i ngy, ch?ng h?n nh? th?t n?c, th?t g, c, tr?ng, ho?c ??u ph?. M?t ao-x? (oz) (28 g) protein n?c b?ng: 1 ao-x? (oz) (28 g) th?t, th?t g ho?c c. 1 qu? tr?ng. 1?4 c?c (62 g) ??u ph?. ?n m?t s? th?c ph?m ch?a cc ch?t bo lnh m?nh m?i ngy, ch?ng h?n nh? qu? b?, qu? h?ch, cc lo?i h?t v c. Ti nn ?n nh?ng th?c ph?m no? Tri cy Qu? m?ng. To. CamSander Nephew? ?o. Qu? m?. Qu? m?n. [place]. Qu? ?u ??Sander Nephew? l?u. Qu? kiwi. Qu? anh ?o. Marlou Starks c? Toney Reil, bao g?m c?i xo?n, rau bina, kale, c?i c?u v?ng, c?i r? c?i b? xanh v c?i b?p. C? c?i ???ng. Sp l?. Bng c?i xanh. C r?t. ??u xanh. C chua. ?t ng?t. Hnh ty. D?a chu?t. C?i bruxen. Ng? c?c Ng? c?c nguyn ca?m nh? bnh m nguyn ca?m ho??c nguyn h?t, bnh quy gin, bnh m ng, ng? c?c v m ?ng. B?t y?n m?ch khng ???ng. H?t dim m?ch (quinoa). G?o l?c ho?c g?o khng xt. Th?t v cc  protein khc H?i s?n. Th?t gia c?m khng da. Mi?ng n?c th?t gia c?m v th?t b. ??u ph?. Qu? h?ch. Cc lo?i h?t. S?a Cc s?n ph?m s??a khng co? ho??c co? i?t ch?t be?o, ch?ng h?n nh? s?a, s??a chua va? pho mt. Nh?ng th?c ph?m li?t k ? trn c th? khng ph?i l m?t danh m?c ??y ?? cc th?c ph?m v ?? u?ng m qu v? c th? ?n v u?ng. Lin h? v?i chuyn gia dinh d??ng ?? c thm thng tin. Ti nn trnh nh?ng th?c ph?m no? Tri cy Tri cy ?ng h?p xi-r. Marlou Starks c? Marlou Starks c? ?ng h?p. Rau ?ng l?nh v?i b? ho?c n??c s?t kem. Ng? c?c Cc s?n ph?m lm t? b?t m tr?ng v b?t m ???c tinh ch? nh? bnh m, m ?ng, ?? ?n nh? v ng? c?c. Trnh t?t c? cc th?c ph?m ch? bi?n s?n. Th?t v cc protein khc Cc la?t thi?t m??. Th?t gia c?m c da. Th?t chin/rn ho?c t?m b?t rn. Th?t ch? bi?n s?n. Trnh cc ch?t bo bo ha. S?a S?a chua nguyn ch?t bo, pho mt ho?c s?a. ?? u?ng ?? u?ng c ???ng, ch?ng h?n nh? soda ho?c tr ?Marland Kitchen Nh?ng th?c ph?m li?t k ? trn c th? khng ph?i l m?t danh m?c ??y ?? cc lo?i th?c ph?m v ?? u?ng m qu v? nn trnh. Lin h? v?i chuyn gia dinh d??ng ?? c thm thng tin. Ca?c cu h?i c?n ??a ra v?i chuyn gia ch?m [place] s?c kh?e Ti c c?n g?p chuyn gia gio d?c v ch?m [place] b?nh ti?u ???ng c ch?ng nh?n khng? Ti c c?n g?p chuyn gia dinh d??ng khng? Ti co? th? go?i cho s? na?o n?u ti co? th?c m?c? Khi no l th?i ?i?m ki?m tra ???ng huy?t t?t nh?t? N?i ?? tm thm thng tin: American Diabetes Association (Hi?p h?i Ti?u ????ng Ho[place] K?): diabetes.org Academy of Nutrition and Dietetics ([place] d??ng v ?n u?ng): eatright.[employer] of Diabetes  and Digestive and Kidney Diseases (Vi?n Ti?u ???ng v cc B?nh Tiu ha v B?nh Th?n Qu?c gia): [employer]is Association of Diabetes Care & Education Specialists (Hi?p h?i Chuyn gia Gio d?c v Ch?m Carpenter B?nh ti?u ???ng): diabeteseducator.org Tm t?t ?i?u r?t quan tr?ng l ph?i c thi quen ?n u?ng t?t  cho s?c kh?e b?i v nh?ng g qu v? ?n v u?ng ?nh h??ng l?n ??n n?ng ?? ???ng huy?t (glucose) c?a qu v?. ?i?u quan tr?ng l s? d?ng m?t r??u cch th?n tr?ng. M?t k? ho?ch ?n u?ng t?t cho s?c kh?e s? gip qu v? ki?m sot ???ng huy?t v gi?m nguy c? m?c b?nh tim. Chuyn gia ch?m [place] s?c kh?e c?a qu v? c th? [place] qu v? trao ??i v?i chuyn gia dinh d??ng ?? xy d?ng ch??ng trnh ?n u?ng t?t nh?t cho qu v?. Thng tin ny khng nh?m m?c ?ch thay th? cho l?i khuyn m chuyn gia ch?m [place] s?c kh?e ni v?i qu v?. Hy b?o ??m qu v? ph?i th?o lu?n b?t k? v?n ?? g m qu v? c v?i chuyn gia ch?m [place] s?c kh?e c?a qu v?. Document Revised: 01/23/2021 Document Reviewed: 01/23/2021 Elsevier Patient Education  [place], MD [facility]

## 2022-12-01 ENCOUNTER — Other Ambulatory Visit: Payer: Self-pay | Admitting: Emergency Medicine

## 2022-12-01 DIAGNOSIS — N401 Enlarged prostate with lower urinary tract symptoms: Secondary | ICD-10-CM

## 2022-12-12 ENCOUNTER — Other Ambulatory Visit: Payer: Self-pay | Admitting: Emergency Medicine

## 2023-01-16 ENCOUNTER — Other Ambulatory Visit: Payer: Self-pay | Admitting: Emergency Medicine

## 2023-01-16 DIAGNOSIS — N401 Enlarged prostate with lower urinary tract symptoms: Secondary | ICD-10-CM

## 2023-01-27 ENCOUNTER — Other Ambulatory Visit: Payer: Self-pay | Admitting: Emergency Medicine

## 2023-01-27 DIAGNOSIS — N401 Enlarged prostate with lower urinary tract symptoms: Secondary | ICD-10-CM

## 2023-01-28 ENCOUNTER — Telehealth: Payer: Self-pay | Admitting: Emergency Medicine

## 2023-01-28 NOTE — Telephone Encounter (Signed)
Handicap placard receive, placed in provider box , awaiting signature

## 2023-01-28 NOTE — Telephone Encounter (Signed)
Patient dropped off document Handicap Placard, to be filled out by provider. Patient requested to send it via Call Patient to pick up within 7-days. Document is located in providers tray at front office.Please advise at Mobile [phone] (mobile)

## 2023-02-03 NOTE — Telephone Encounter (Signed)
Called patient to inform him that his handicap placard is ready for pick up. He will come by the office. The form will be upfront

## 2023-02-04 NOTE — Telephone Encounter (Signed)
Pt has picked up forms

## 2023-03-12 ENCOUNTER — Other Ambulatory Visit: Payer: Self-pay | Admitting: Emergency Medicine

## 2023-03-13 ENCOUNTER — Other Ambulatory Visit: Payer: Self-pay | Admitting: Emergency Medicine

## 2023-03-15 ENCOUNTER — Other Ambulatory Visit: Payer: Self-pay | Admitting: Emergency Medicine

## 2023-03-15 DIAGNOSIS — N401 Enlarged prostate with lower urinary tract symptoms: Secondary | ICD-10-CM

## 2023-03-31 ENCOUNTER — Other Ambulatory Visit: Payer: Self-pay | Admitting: Emergency Medicine

## 2023-03-31 DIAGNOSIS — E1169 Type 2 diabetes mellitus with other specified complication: Secondary | ICD-10-CM

## 2023-04-27 ENCOUNTER — Ambulatory Visit (INDEPENDENT_AMBULATORY_CARE_PROVIDER_SITE_OTHER): Payer: Medicare Other | Admitting: Emergency Medicine

## 2023-04-27 ENCOUNTER — Encounter: Payer: Self-pay | Admitting: Emergency Medicine

## 2023-04-27 VITALS — BP 122/78 | HR 78 | Temp 98.1°F | Ht 65.0 in | Wt 124.4 lb

## 2023-04-27 DIAGNOSIS — N138 Other obstructive and reflux uropathy: Secondary | ICD-10-CM

## 2023-04-27 DIAGNOSIS — E1169 Type 2 diabetes mellitus with other specified complication: Secondary | ICD-10-CM | POA: Diagnosis not present

## 2023-04-27 DIAGNOSIS — I152 Hypertension secondary to endocrine disorders: Secondary | ICD-10-CM | POA: Diagnosis not present

## 2023-04-27 DIAGNOSIS — E785 Hyperlipidemia, unspecified: Secondary | ICD-10-CM | POA: Diagnosis not present

## 2023-04-27 DIAGNOSIS — N401 Enlarged prostate with lower urinary tract symptoms: Secondary | ICD-10-CM

## 2023-04-27 DIAGNOSIS — E1159 Type 2 diabetes mellitus with other circulatory complications: Secondary | ICD-10-CM

## 2023-04-27 DIAGNOSIS — Z7984 Long term (current) use of oral hypoglycemic drugs: Secondary | ICD-10-CM

## 2023-04-27 DIAGNOSIS — R3915 Urgency of urination: Secondary | ICD-10-CM | POA: Diagnosis not present

## 2023-04-27 LAB — LIPID PANEL
Cholesterol: 107 mg/dL (ref 0–200)
HDL: 47 mg/dL (ref >39.00)
LDL Cholesterol: 32 mg/dL (ref 0–99)
NonHDL: 59.62
Total CHOL/HDL Ratio: 2
Triglycerides: 138 mg/dL (ref 0.0–149.0)
VLDL: 27.6 mg/dL (ref 0.0–40.0)

## 2023-04-27 LAB — COMPREHENSIVE METABOLIC PANEL
ALT: 16 U/L (ref 0–53)
AST: 19 U/L (ref 0–37)
Albumin: 3.8 g/dL (ref 3.5–5.2)
Alkaline Phosphatase: 47 U/L (ref 39–117)
BUN: 27 mg/dL — ABNORMAL HIGH (ref 6–23)
CO2: 31 mEq/L (ref 19–32)
Calcium: 9.1 mg/dL (ref 8.4–10.5)
Chloride: 102 mEq/L (ref 96–112)
Creatinine, Ser: 1.16 mg/dL (ref 0.40–1.50)
GFR: 58.79 mL/min — ABNORMAL LOW (ref >60.00)
Glucose, Bld: 258 mg/dL — ABNORMAL HIGH (ref 70–99)
Potassium: 4.3 mEq/L (ref 3.5–5.1)
Sodium: 139 mEq/L (ref 135–145)
Total Bilirubin: 0.4 mg/dL (ref 0.2–1.2)
Total Protein: 6.5 g/dL (ref 6.0–8.3)

## 2023-04-27 LAB — POCT GLYCOSYLATED HEMOGLOBIN (HGB A1C): Hemoglobin A1C: 7.2 % — AB (ref 4.0–5.6)

## 2023-04-27 LAB — CBC WITH DIFFERENTIAL/PLATELET
Basophils Absolute: 0 10*3/uL (ref 0.0–0.1)
Basophils Relative: 0.2 % (ref 0.0–3.0)
Eosinophils Absolute: 0.2 10*3/uL (ref 0.0–0.7)
Eosinophils Relative: 2.7 % (ref 0.0–5.0)
HCT: 40.2 % (ref 39.0–52.0)
Hemoglobin: 13 g/dL (ref 13.0–17.0)
Lymphocytes Relative: 32.9 % (ref 12.0–46.0)
Lymphs Abs: 2.5 10*3/uL (ref 0.7–4.0)
MCHC: 32.2 g/dL (ref 30.0–36.0)
MCV: 94.3 fl (ref 78.0–100.0)
Monocytes Absolute: 0.5 10*3/uL (ref 0.1–1.0)
Monocytes Relative: 6.5 % (ref 3.0–12.0)
Neutro Abs: 4.4 10*3/uL (ref 1.4–7.7)
Neutrophils Relative %: 57.7 % (ref 43.0–77.0)
Platelets: 237 10*3/uL (ref 150.0–400.0)
RBC: 4.27 Mil/uL (ref 4.22–5.81)
RDW: 14.1 % (ref 11.5–15.5)
WBC: 7.6 10*3/uL (ref 4.0–10.5)

## 2023-04-27 LAB — VITAMIN B12: Vitamin B-12: 957 pg/mL — ABNORMAL HIGH (ref 211–911)

## 2023-04-27 LAB — MICROALBUMIN / CREATININE URINE RATIO
Creatinine,U: 53.9 mg/dL
Microalb Creat Ratio: 1.3 mg/g (ref 0.0–30.0)
Microalb, Ur: <0.7 mg/dL (ref 0.0–1.9)

## 2023-04-27 MED ORDER — MIRABEGRON ER 50 MG PO TB24: 50.0000 mg | ORAL_TABLET | Freq: Every day | ORAL | 3 refills | Status: AC

## 2023-04-27 NOTE — Assessment & Plan Note (Signed)
BP Readings from Last 3 Encounters:  04/27/23 122/78  11/11/22 124/76  09/26/22 (!) 171/85  Well-controlled hypertension but hypotensive episodes in the morning most likely related to Cardura Recommend to decrease dose to 2 mg daily Well-controlled diabetes with hemoglobin A1c of 7.2 Continue metformin 500 mg twice a day Diet and nutrition discussed

## 2023-04-27 NOTE — Patient Instructions (Addendum)
Decrease dose of doxazosin to 1 tablet at bedtime Start Myrbetriq 50 mg daily to treat symptoms of overactive bladder Continue metformin and rosuvastatin Get blood work today Follow-up in 6 months  Health Maintenance After Age 82 After age 25, you are at a higher risk for certain long-term diseases and infections as well as injuries from falls. Falls are a major cause of broken bones and head injuries in people who are older than age 53. Getting regular preventive care can help to keep you healthy and well. Preventive care includes getting regular testing and making lifestyle changes as recommended by your health care provider. Talk with your health care provider about: Which screenings and tests you should have. A screening is a test that checks for a disease when you have no symptoms. A diet and exercise plan that is right for you. What should I know about screenings and tests to prevent falls? Screening and testing are the best ways to find a health problem early. Early diagnosis and treatment give you the best chance of managing medical conditions that are common after age 42. Certain conditions and lifestyle choices may make you more likely to have a fall. Your health care provider may recommend: Regular vision checks. Poor vision and conditions such as cataracts can make you more likely to have a fall. If you wear glasses, make sure to get your prescription updated if your vision changes. Medicine review. Work with your health care provider to regularly review all of the medicines you are taking, including over-the-counter medicines. Ask your health care provider about any side effects that may make you more likely to have a fall. Tell your health care provider if any medicines that you take make you feel dizzy or sleepy. Strength and balance checks. Your health care provider may recommend certain tests to check your strength and balance while standing, walking, or changing positions. Foot  health exam. Foot pain and numbness, as well as not wearing proper footwear, can make you more likely to have a fall. Screenings, including: Osteoporosis screening. Osteoporosis is a condition that causes the bones to get weaker and break more easily. Blood pressure screening. Blood pressure changes and medicines to control blood pressure can make you feel dizzy. Depression screening. You may be more likely to have a fall if you have a fear of falling, feel depressed, or feel unable to do activities that you used to do. Alcohol use screening. Using too much alcohol can affect your balance and may make you more likely to have a fall. Follow these instructions at home: Lifestyle Do not drink alcohol if: Your health care provider tells you not to drink. If you drink alcohol: Limit how much you have to: 0-1 drink a day for women. 0-2 drinks a day for men. Know how much alcohol is in your drink. In the [place] one drink equals one 12 oz bottle of beer (355 mL), one 5 oz glass of wine (148 mL), or one 1 oz glass of hard liquor (44 mL). Do not use any products that contain nicotine or tobacco. These products include cigarettes, chewing tobacco, and vaping devices, such as e-cigarettes. If you need help quitting, ask your health care provider. Activity  Follow a regular exercise program to stay fit. This will help you maintain your balance. Ask your health care provider what types of exercise are appropriate for you. If you need a cane or walker, use it as recommended by your health care provider. Wear supportive shoes that  have nonskid soles. Safety  Remove any tripping hazards, such as rugs, cords, and clutter. Install safety equipment such as grab bars in bathrooms and safety rails on stairs. Keep rooms and walkways well-lit. General instructions Talk with your health care provider about your risks for falling. Tell your health care provider if: You fall. Be sure to tell your health care  provider about all falls, even ones that seem minor. You feel dizzy, tiredness (fatigue), or off-balance. Take over-the-counter and prescription medicines only as told by your health care provider. These include supplements. Eat a healthy diet and maintain a healthy weight. A healthy diet includes low-fat dairy products, low-fat (lean) meats, and fiber from whole grains, beans, and lots of fruits and vegetables. Stay current with your vaccines. Schedule regular health, dental, and eye exams. Summary Having a healthy lifestyle and getting preventive care can help to protect your health and wellness after age 53. Screening and testing are the best way to find a health problem early and help you avoid having a fall. Early diagnosis and treatment give you the best chance for managing medical conditions that are more common for people who are older than age 15. Falls are a major cause of broken bones and head injuries in people who are older than age 91. Take precautions to prevent a fall at home. Work with your health care provider to learn what changes you can make to improve your health and wellness and to prevent falls. This information is not intended to replace advice given to you by your health care provider. Make sure you discuss any questions you have with your health care provider. Document Revised: 01/13/2021 Document Reviewed: 01/13/2021 Elsevier Patient Education  2024 [employer].

## 2023-04-27 NOTE — Assessment & Plan Note (Signed)
Need to decrease dose of Cardura to 2 mg at bedtime Recommend to start Myrbetriq 50 mg daily for overactive bladder symptoms which are affecting his quality of life

## 2023-04-27 NOTE — Assessment & Plan Note (Signed)
Stable chronic conditions Well-controlled diabetes with hemoglobin A1c of 7.2 Continue rosuvastatin 10 mg daily

## 2023-04-27 NOTE — Progress Notes (Signed)
Erik Aguirre 82 y.o.   Chief Complaint  Patient presents with   Follow-up    F/u for HTN and DM. Pt states in the mornings is b/p is low  but does get better in the afternoon frequent urination during the night at least 5-6 times  ( Pt is HOH )    HISTORY OF PRESENT ILLNESS: This is a 82 y.o. male here for follow-up of multiple chronic medical conditions including diabetes and hypertension Today we are using [place] interpreter, Tri. Wakes up in the morning with very low blood pressure making him feel lightheaded and dizzy Also has a lot of urinary frequency during the night No other complaints or medical concerns today. BP Readings from Last 3 Encounters:  11/11/22 124/76  09/26/22 (!) 171/85  04/28/22 130/70   Lab Results  Component Value Date   HGBA1C 7.7 (A) 11/11/2022     HPI   Prior to Admission medications   Medication Sig Start Date End Date Taking? Authorizing Provider  azelastine (ASTELIN) 0.1 % nasal spray Place 1 spray into both nostrils 2 (two) times daily. Use in each nostril as directed 04/28/22  Yes Diego Ulbricht, Eilleen Kempf, MD  doxazosin (CARDURA) 2 MG tablet TAKE 1 & 1/2 (ONE & ONE-HALF) TABLETS BY MOUTH ONCE DAILY 03/15/23  Yes Keiran Sias, Eilleen Kempf, MD  ibuprofen (ADVIL) 600 MG tablet Take 1 tablet (600 mg total) by mouth every 8 (eight) hours as needed. 09/26/22  Yes Ellsworth Lennox, PA-C  metFORMIN (GLUCOPHAGE) 1000 MG tablet TAKE 1 TABLET BY MOUTH TWICE DAILY WITH A MEAL 03/13/23  Yes Thersea Manfredonia, Eilleen Kempf, MD  Multiple Vitamin (MULTIVITAMIN) capsule Take 1 capsule by mouth daily. Reported on 01/15/2016   Yes [provider]  predniSONE (DELTASONE) 10 MG tablet Take 1 tablet (10 mg total) by mouth 3 (three) times daily. 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   Benign prostatic hyperplasia (BPH) with urinary urgency 03/17/2013   BPH with obstruction/lower urinary tract symptoms 03/17/2013    Past Medical History:  Diagnosis Date   Acute appendicitis s/p alp appy 11/27/2014 11/26/2014   BPH (benign prostatic hyperplasia)    Hyperlipidemia    Hypertension    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    Past Surgical History:  Procedure Laterality Date   HERNIA REPAIR     LAPAROSCOPIC APPENDECTOMY N/A 11/27/2014   Procedure: APPENDECTOMY LAPAROSCOPIC ;  Surgeon: Karie Soda, MD;  Location: [facility];  Service: General;  Laterality: N/A;    Social History   Socioeconomic History   Marital status: Married    Spouse name: Not on file   Number of children: 5   Years of education: Not on file   Highest education level: Not on file  Occupational History   Occupation: Retired  Tobacco Use   Smoking status: Never   Smokeless tobacco: Never  Vaping Use   Vaping status: Never Used  Substance and  Sexual Activity   Alcohol use: Yes    Comment: occ   Drug use: No   Sexual activity: Not Currently  Other Topics Concern   Not on file  Social History Narrative   Originally from [place].   Language of English okay   Social Determinants of Health   Financial Resource Strain: Not on file  Food Insecurity: Not on file  Transportation Needs: Not on file  Physical Activity: Not on file  Stress: Not on file  Social Connections: Not on file  Intimate Partner Violence: Not on file    Family History  Problem Relation Age of Onset   Throat cancer  Brother    Asthma Son      Review of Systems  Constitutional: Negative.   HENT: Negative.  Negative for congestion and sore throat.   Respiratory: Negative.  Negative for cough and shortness of breath.   Cardiovascular: Negative.  Negative for chest pain and palpitations.  Gastrointestinal:  Negative for abdominal pain, nausea and vomiting.  Genitourinary:  Positive for frequency.  Skin: Negative.  Negative for rash.  Neurological: Negative.  Negative for dizziness and headaches.  All other systems reviewed and are negative.   Today's Vitals   04/27/23 1424  BP: 122/78  Pulse: 78  Temp: 98.1 F (36.7 C)  TempSrc: Oral  Weight: 124 lb 6.4 oz (56.4 kg)  Height: 5\' 5"  (1.651 m)   Body mass index is 20.7 kg/m.   Physical Exam Vitals reviewed.  Constitutional:      Appearance: Normal appearance.  HENT:     Head: Normocephalic.     Mouth/Throat:     Mouth: Mucous membranes are moist.     Pharynx: Oropharynx is clear.  Eyes:     Extraocular Movements: Extraocular movements intact.     Conjunctiva/sclera: Conjunctivae normal.     Pupils: Pupils are equal, round, and reactive to light.  Cardiovascular:     Rate and Rhythm: Normal rate and regular rhythm.     Pulses: Normal pulses.     Heart sounds: Normal heart sounds.  Pulmonary:     Effort: Pulmonary effort is normal.     Breath sounds: Normal breath sounds.  Musculoskeletal:     Cervical back: No tenderness.  Lymphadenopathy:     Cervical: No cervical adenopathy.  Skin:    General: Skin is warm and dry.     Capillary Refill: Capillary refill takes less than 2 seconds.  Neurological:     General: No focal deficit present.     Mental Status: He is alert and oriented to person, place, and time.  Psychiatric:        Mood and Affect: Mood normal.        Behavior: Behavior normal.    Results for orders placed or performed in visit on 04/27/23 (from the past 24 hour(s))  POCT glycosylated hemoglobin (Hb A1C)      Status: Abnormal   Collection Time: 04/27/23  3:05 PM  Result Value Ref Range   Hemoglobin A1C 7.2 (A) 4.0 - 5.6 %   HbA1c POC (<> result, manual entry)     HbA1c, POC (prediabetic range)     HbA1c, POC (controlled diabetic range)       ASSESSMENT & PLAN: A total of 44 minutes was spent with the patient and counseling/coordination of care regarding preparing for this visit, review of most recent office visit notes, review of multiple chronic medical conditions under management, review of  all medications, review of most recent blood work results including interpretation of today's hemoglobin A1c, cardiovascular risk associated with hypertension and diabetes, education on nutrition, prognosis, documentation and need for follow-up.  Problem List Items Addressed This Visit       Cardiovascular and Mediastinum   Hypertension associated with diabetes (HCC) - Primary    BP Readings from Last 3 Encounters:  04/27/23 122/78  11/11/22 124/76  09/26/22 (!) 171/85  Well-controlled hypertension but hypotensive episodes in the morning most likely related to Cardura Recommend to decrease dose to 2 mg daily Well-controlled diabetes with hemoglobin A1c of 7.2 Continue metformin 500 mg twice a day Diet and nutrition discussed       Relevant Orders   Urine Microalbumin w/creat. ratio   CBC with Differential/Platelet   Comprehensive metabolic panel   Lipid panel   POCT glycosylated hemoglobin (Hb A1C) (Completed)   Vitamin B12     Endocrine   Dyslipidemia associated with type 2 diabetes mellitus (HCC)    Stable chronic conditions Well-controlled diabetes with hemoglobin A1c of 7.2 Continue rosuvastatin 10 mg daily       Relevant Orders   Urine Microalbumin w/creat. ratio   CBC with Differential/Platelet   Comprehensive metabolic panel   Lipid panel   POCT glycosylated hemoglobin (Hb A1C) (Completed)     Genitourinary   BPH with obstruction/lower urinary tract symptoms    Need to  decrease dose of Cardura to 2 mg at bedtime Recommend to start Myrbetriq 50 mg daily for overactive bladder symptoms which are affecting his quality of life        Other   Benign prostatic hyperplasia (BPH) with urinary urgency   Relevant Orders   Urine Microalbumin w/creat. ratio   CBC with Differential/Platelet   Comprehensive metabolic panel   Lipid panel   POCT glycosylated hemoglobin (Hb A1C) (Completed)   Patient Instructions  Decrease dose of doxazosin to 1 tablet at bedtime Start Myrbetriq 50 mg daily to treat symptoms of overactive bladder Continue metformin and rosuvastatin Get blood work today Follow-up in 6 months  Health Maintenance After Age 63 After age 71, you are at a higher risk for certain long-term diseases and infections as well as injuries from falls. Falls are a major cause of broken bones and head injuries in people who are older than age 59. Getting regular preventive care can help to keep you healthy and well. Preventive care includes getting regular testing and making lifestyle changes as recommended by your health care provider. Talk with your health care provider about: Which screenings and tests you should have. A screening is a test that checks for a disease when you have no symptoms. A diet and exercise plan that is right for you. What should I know about screenings and tests to prevent falls? Screening and testing are the best ways to find a health problem early. Early diagnosis and treatment give you the best chance of managing medical conditions that are common after age 90. Certain conditions and lifestyle choices may make you more likely to have a fall. Your health care provider may recommend: Regular vision checks. Poor vision and conditions such as cataracts can make you more likely to have a fall. If you wear glasses, make sure to get your prescription updated if your vision changes. Medicine review. Work with your health care provider to regularly  review all of the medicines you are taking, including over-the-counter medicines. Ask your health care provider about any side effects that may  make you more likely to have a fall. Tell your health care provider if any medicines that you take make you feel dizzy or sleepy. Strength and balance checks. Your health care provider may recommend certain tests to check your strength and balance while standing, walking, or changing positions. Foot health exam. Foot pain and numbness, as well as not wearing proper footwear, can make you more likely to have a fall. Screenings, including: Osteoporosis screening. Osteoporosis is a condition that causes the bones to get weaker and break more easily. Blood pressure screening. Blood pressure changes and medicines to control blood pressure can make you feel dizzy. Depression screening. You may be more likely to have a fall if you have a fear of falling, feel depressed, or feel unable to do activities that you used to do. Alcohol use screening. Using too much alcohol can affect your balance and may make you more likely to have a fall. Follow these instructions at home: Lifestyle Do not drink alcohol if: Your health care provider tells you not to drink. If you drink alcohol: Limit how much you have to: 0-1 drink a day for women. 0-2 drinks a day for men. Know how much alcohol is in your drink. In the [place] one drink equals one 12 oz bottle of beer (355 mL), one 5 oz glass of wine (148 mL), or one 1 oz glass of hard liquor (44 mL). Do not use any products that contain nicotine or tobacco. These products include cigarettes, chewing tobacco, and vaping devices, such as e-cigarettes. If you need help quitting, ask your health care provider. Activity  Follow a regular exercise program to stay fit. This will help you maintain your balance. Ask your health care provider what types of exercise are appropriate for you. If you need a cane or walker, use it as recommended  by your health care provider. Wear supportive shoes that have nonskid soles. Safety  Remove any tripping hazards, such as rugs, cords, and clutter. Install safety equipment such as grab bars in bathrooms and safety rails on stairs. Keep rooms and walkways well-lit. General instructions Talk with your health care provider about your risks for falling. Tell your health care provider if: You fall. Be sure to tell your health care provider about all falls, even ones that seem minor. You feel dizzy, tiredness (fatigue), or off-balance. Take over-the-counter and prescription medicines only as told by your health care provider. These include supplements. Eat a healthy diet and maintain a healthy weight. A healthy diet includes low-fat dairy products, low-fat (lean) meats, and fiber from whole grains, beans, and lots of fruits and vegetables. Stay current with your vaccines. Schedule regular health, dental, and eye exams. Summary Having a healthy lifestyle and getting preventive care can help to protect your health and wellness after age 55. Screening and testing are the best way to find a health problem early and help you avoid having a fall. Early diagnosis and treatment give you the best chance for managing medical conditions that are more common for people who are older than age 81. Falls are a major cause of broken bones and head injuries in people who are older than age 25. Take precautions to prevent a fall at home. Work with your health care provider to learn what changes you can make to improve your health and wellness and to prevent falls. This information is not intended to replace advice given to you by your health care provider. Make sure you discuss any questions you  have with your health care provider. Document Revised: 01/13/2021 Document Reviewed: 01/13/2021 Elsevier Patient Education  2024 Elsevier Inc.     Edwina Barth, MD [facility]

## 2023-06-02 DIAGNOSIS — Z961 Presence of intraocular lens: Secondary | ICD-10-CM | POA: Diagnosis not present

## 2023-06-02 DIAGNOSIS — H25811 Combined forms of age-related cataract, right eye: Secondary | ICD-10-CM | POA: Diagnosis not present

## 2023-06-08 DIAGNOSIS — E119 Type 2 diabetes mellitus without complications: Secondary | ICD-10-CM | POA: Diagnosis not present

## 2023-06-16 DIAGNOSIS — H2511 Age-related nuclear cataract, right eye: Secondary | ICD-10-CM | POA: Diagnosis not present

## 2023-06-18 DIAGNOSIS — H25811 Combined forms of age-related cataract, right eye: Secondary | ICD-10-CM | POA: Diagnosis not present

## 2023-06-25 ENCOUNTER — Other Ambulatory Visit: Payer: Self-pay | Admitting: Emergency Medicine

## 2023-06-25 DIAGNOSIS — E1169 Type 2 diabetes mellitus with other specified complication: Secondary | ICD-10-CM

## 2023-07-09 DIAGNOSIS — E119 Type 2 diabetes mellitus without complications: Secondary | ICD-10-CM | POA: Diagnosis not present

## 2023-07-10 ENCOUNTER — Other Ambulatory Visit: Payer: Self-pay | Admitting: Emergency Medicine

## 2023-07-10 DIAGNOSIS — N401 Enlarged prostate with lower urinary tract symptoms: Secondary | ICD-10-CM

## 2023-08-30 ENCOUNTER — Other Ambulatory Visit: Payer: Self-pay | Admitting: Emergency Medicine

## 2023-09-05 ENCOUNTER — Other Ambulatory Visit: Payer: Self-pay | Admitting: Emergency Medicine

## 2023-09-05 DIAGNOSIS — N401 Enlarged prostate with lower urinary tract symptoms: Secondary | ICD-10-CM

## 2023-09-20 ENCOUNTER — Other Ambulatory Visit: Payer: Self-pay | Admitting: Emergency Medicine

## 2023-09-20 DIAGNOSIS — E1169 Type 2 diabetes mellitus with other specified complication: Secondary | ICD-10-CM

## 2023-11-05 ENCOUNTER — Other Ambulatory Visit: Payer: Self-pay | Admitting: Emergency Medicine

## 2023-11-05 DIAGNOSIS — N401 Enlarged prostate with lower urinary tract symptoms: Secondary | ICD-10-CM

## 2023-11-12 ENCOUNTER — Other Ambulatory Visit (HOSPITAL_COMMUNITY): Payer: Self-pay

## 2023-11-15 ENCOUNTER — Other Ambulatory Visit (HOSPITAL_COMMUNITY): Payer: Self-pay

## 2023-11-15 ENCOUNTER — Telehealth: Payer: Self-pay

## 2023-11-15 NOTE — Telephone Encounter (Signed)
 Pharmacy Patient Advocate Encounter   Received notification from Fax that prior authorization for Doxazosin 2mg  tabs is required/requested.   Insurance verification completed.   The patient is insured through [employer] .   Per test claim: PA required; PA submitted to above mentioned insurance via CoverMyMeds Key/confirmation #/EOC [id number] Status is pending

## 2023-11-15 NOTE — Telephone Encounter (Signed)
 Pharmacy Patient Advocate Encounter  Received notification from [employer] that Prior Authorization for Doxazosin 2mg  has been APPROVED from 10/16/23 to 11/14/24   PA #/Case ID/Reference #:  [id number]

## 2023-12-30 ENCOUNTER — Other Ambulatory Visit: Payer: Self-pay | Admitting: Emergency Medicine

## 2023-12-30 DIAGNOSIS — R3915 Urgency of urination: Secondary | ICD-10-CM

## 2024-01-24 LAB — HM DIABETES EYE EXAM

## 2024-01-28 ENCOUNTER — Telehealth: Payer: Self-pay

## 2024-01-28 NOTE — Telephone Encounter (Signed)
 This patient is appearing on a report for being at risk of failing the adherence measure for cholesterol (statin) medications this calendar year.   Medication: rosuvastatin  10 mg Last fill date: 09/20/23 for 90 day supply  Good adherence with previous fills. Overdue for next refill as it was due 12/19/23. Utilized interpreter services to leave voicemail for patient to return my call at their convenience.   Abelina Abide, PharmD PGY1 Pharmacy Resident 01/28/2024 4:25 PM

## 2024-03-24 ENCOUNTER — Ambulatory Visit: Payer: Medicare (Managed Care)

## 2024-03-24 VITALS — BP 130/70 | HR 67 | Ht 65.0 in | Wt 124.4 lb

## 2024-03-24 DIAGNOSIS — Z0000 Encounter for general adult medical examination without abnormal findings: Secondary | ICD-10-CM | POA: Diagnosis not present

## 2024-03-24 NOTE — Progress Notes (Signed)
 Subjective:   Erik Aguirre is a 83 y.o. who presents for a Medicare Wellness preventive visit.  As a reminder, Annual Wellness Visits don't include a physical exam, and some assessments may be limited, especially if this visit is performed virtually. We may recommend an in-person follow-up visit with your provider if needed.  Visit Complete: In person  Persons Participating in Visit: Patient assisted by interpreter.  AWV Questionnaire: No: Patient Medicare AWV questionnaire was not completed prior to this visit.  Cardiac Risk Factors include: hypertension;advanced age (>16men, >40 women);dyslipidemia;diabetes mellitus;Other (see comment), Risk factor comments: BPH,     Objective:    Today's Vitals   03/24/24 1433  Weight: 124 lb 6.4 oz (56.4 kg)  Height: 5' 5 (1.651 m)   Body mass index is 20.7 kg/m.     03/24/2024    2:43 PM 11/26/2014    9:23 PM 11/03/2014   11:39 PM 11/03/2014    5:54 PM  Advanced Directives  Does Patient Have a Medical Advance Directive? No No  No  No   Would patient like information on creating a medical advance directive? No - Patient declined No - patient declined information  No - patient declined information       Data saved with a previous flowsheet row definition    Current Medications (verified) Outpatient Encounter Medications as of 03/24/2024  Medication Sig   azelastine  (ASTELIN ) 0.1 % nasal spray Place 1 spray into both nostrils 2 (two) times daily. Use in each nostril as directed   doxazosin  (CARDURA ) 2 MG tablet TAKE 1 & 1/2 (ONE & ONE-HALF) TABLETS BY MOUTH ONCE DAILY   ibuprofen  (ADVIL ) 600 MG tablet Take 1 tablet (600 mg total) by mouth every 8 (eight) hours as needed.   metFORMIN  (GLUCOPHAGE ) 1000 MG tablet TAKE 1 TABLET BY MOUTH TWICE DAILY WITH A MEAL   mirabegron  ER (MYRBETRIQ ) 50 MG TB24 tablet Take 1 tablet (50 mg total) by mouth daily.   Multiple Vitamin (MULTIVITAMIN) capsule Take 1 capsule by mouth daily. Reported on 01/15/2016    predniSONE  (DELTASONE ) 10 MG tablet Take 1 tablet (10 mg total) by mouth 3 (three) times daily.   rosuvastatin  (CRESTOR ) 10 MG tablet Take 1 tablet by mouth once daily   No facility-administered encounter medications on file as of 03/24/2024.    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   Past Surgical History:  Procedure Laterality Date   HERNIA REPAIR     LAPAROSCOPIC APPENDECTOMY N/A 11/27/2014   Procedure: APPENDECTOMY LAPAROSCOPIC ;  Surgeon: Elspeth Schultze, MD;  Location: [facility];  Service: General;  Laterality: N/A;   Family History  Problem Relation Age of Onset   Throat cancer Brother    Asthma Son    Social History   Socioeconomic History   Marital status: Married    Spouse name: Not on file   Number of children: 5   Years of education: Not on file   Highest education level: Not on file  Occupational History   Occupation: Retired  Tobacco Use   Smoking status: Never   Smokeless tobacco: Never  Vaping Use   Vaping status: Never Used  Substance and Sexual Activity   Alcohol use: Yes    Comment: occ   Drug use: No  Sexual activity: Not Currently  Other Topics Concern   Not on file  Social History Narrative   Originally from [place].   Language of English okay   Social Drivers of Health   Financial Resource Strain: Low Risk  (03/24/2024)   Overall Financial Resource Strain (CARDIA)    Difficulty of Paying Living Expenses: Not very hard  Food Insecurity: No Food Insecurity (03/24/2024)   Hunger Vital Sign    Worried About Running Out of Food in the Last Year: Never true    Ran Out of Food in the Last Year: Never true  Transportation Needs: No Transportation Needs (03/24/2024)   PRAPARE - Scientist, research (physical sciences) (Medical): No    Lack of Transportation (Non-Medical): No  Physical Activity: Sufficiently Active (03/24/2024)   Exercise Vital Sign    Days of Exercise per Week: 7 days    Minutes of Exercise per Session: 30 min  Stress: No Stress Concern Present (03/24/2024)   [employer] of Occupational Health - Occupational Stress Questionnaire    Feeling of Stress: Only a little  Social Connections: Moderately Integrated (03/24/2024)   Social Connection and Isolation Panel    Frequency of Communication with Friends and Family: More than three times a week    Frequency of Social Gatherings with Friends and Family: Twice a week    Attends Religious Services: More than 4 times per year    Active Member of [employer] or Organizations: No    Attends Engineer, structural: Never    Marital Status: Married    Tobacco Counseling Counseling given: Not Answered    Clinical Intake:  Pre-visit preparation completed: Yes  Pain : No/denies pain     BMI - recorded: 20.7 Nutritional Status: BMI of 19-24  Normal Nutritional Risks: None Diabetes: Yes CBG done?: No (checked 2 days ago) Did pt. bring in CBG monitor from home?: No  Lab Results  Component Value Date   HGBA1C 7.2 (A) 04/27/2023   HGBA1C 7.7 (A) 11/11/2022   HGBA1C 7.3 (A) 04/28/2022   HGBA1C 7.3 04/28/2022   HGBA1C 7.3 (A) 04/28/2022   HGBA1C 7.3 (A) 04/28/2022     How often do you need to have someone help you when you read instructions, pamphlets, or other written materials from your doctor or pharmacy?: 3 - Sometimes  Interpreter Needed?: Yes Interpreter Name: Erik Aguirre Patient Declined Interpreter : No  Information entered by :: Ambera Fedele, RMA   Activities of Daily Living     03/24/2024    2:08 PM  In your present state of health, do you have any difficulty performing the following activities:  Hearing? 1  Comment Has hearing aides  Vision? 0  Difficulty concentrating or making decisions? 0   Walking or climbing stairs? 0  Dressing or bathing? 0  Doing errands, shopping? 0  Preparing Food and eating ? N  Using the Toilet? N  In the past six months, have you accidently leaked urine? Y  Comment sometimes  Do you have problems with loss of bowel control? N  Managing your Medications? N  Managing your Finances? N  Housekeeping or managing your Housekeeping? N    Patient Care Team: Purcell Emil Schanz, MD as PCP - General (Internal Medicine) Sheldon Standing, MD as Consulting Physician (General Surgery) Rollin Dover, MD as Consulting Physician (Gastroenterology)  I have updated your Care Teams any recent Medical Services you may have received from other providers in the past  year.     Assessment:   This is a routine wellness examination for Erik Aguirre.  Hearing/Vision screen Hearing Screening - Comments:: Has hearing aides Vision Screening - Comments:: Denies vision issues.    Goals Addressed   None    Depression Screen     03/24/2024    2:47 PM 04/27/2023    2:34 PM 11/11/2022    2:45 PM 04/28/2022    2:29 PM 11/03/2021    2:42 PM 11/25/2020    2:02 PM 05/27/2020    2:00 PM  PHQ 2/9 Scores  PHQ - 2 Score 0 0 0 0 0 0 0  PHQ- 9 Score 0          Fall Risk     03/24/2024    2:43 PM 04/27/2023    2:34 PM 11/11/2022    2:45 PM 04/28/2022    2:29 PM 11/03/2021    2:43 PM  Fall Risk   Falls in the past year? 0 0 0 0 1  Number falls in past yr: 0 0 0 0 0  Injury with Fall? 0 0 0 0 1  Risk for fall due to :  No Fall Risks No Fall Risks    Follow up Falls evaluation completed;Falls prevention discussed  Falls evaluation completed      MEDICARE RISK AT HOME:  Medicare Risk at Home Any stairs in or around the home?: No If so, are there any without handrails?: No Home free of loose throw rugs in walkways, pet beds, electrical cords, etc?: Yes Adequate lighting in your home to reduce risk of falls?: Yes Life alert?: No Use of a cane, walker or w/c?: No Grab bars in the  bathroom?: No Shower chair or bench in shower?: No Elevated toilet seat or a handicapped toilet?: No  TIMED UP AND GO:  Was the test performed?  Yes  Length of time to ambulate 10 feet: 15 sec Gait slow and steady without use of assistive device  Cognitive Function: Unable: Due to language barrier, hearing or vision limitations or other         Immunizations Immunization History  Administered Date(s) Administered   Fluad Quad(high Dose 65+) 05/30/2019, 05/27/2020, 05/28/2021, 08/07/2022   Influenza Split 06/07/2013   Influenza,inj,Quad PF,6+ Mos 07/02/2014, 06/21/2015, 09/15/2016, 05/28/2017   Influenza,trivalent, recombinat, inj, PF 06/27/2013   Pneumococcal Conjugate-13 07/02/2014   Pneumococcal Polysaccharide-23 05/28/2017    Screening Tests Health Maintenance  Topic Date Due   DTaP/Tdap/Td (1 - Tdap) Never done   Zoster Vaccines- Shingrix (1 of 2) Never done   Diabetic kidney evaluation - Urine ACR  05/27/2021   FOOT EXAM  11/25/2021   COVID-19 Vaccine (1 - 2024-25 season) Never done   HEMOGLOBIN A1C  10/28/2023   INFLUENZA VACCINE  04/07/2024   Diabetic kidney evaluation - eGFR measurement  04/26/2024   OPHTHALMOLOGY EXAM  01/23/2025   Medicare Annual Wellness (AWV)  03/24/2025   Pneumococcal Vaccine: 50+ Years  Completed   Hepatitis B Vaccines  Aged Out   HPV VACCINES  Aged Out   Meningococcal B Vaccine  Aged Out    Health Maintenance  Health Maintenance Due  Topic Date Due   DTaP/Tdap/Td (1 - Tdap) Never done   Zoster Vaccines- Shingrix (1 of 2) Never done   Diabetic kidney evaluation - Urine ACR  05/27/2021   FOOT EXAM  11/25/2021   COVID-19 Vaccine (1 - 2024-25 season) Never done   HEMOGLOBIN A1C  10/28/2023   Health Maintenance Items  Addressed: Diabetic Foot Exam recommended, Labs Ordered: UACR and a A1C check, See Nurse Notes at the end of this note  Additional Screening:  Vision Screening: Recommended annual ophthalmology exams for early  detection of glaucoma and other disorders of the eye. Would you like a referral to an eye doctor? No    Dental Screening: Recommended annual dental exams for proper oral hygiene  Community Resource Referral / Chronic Care Management: CRR required this visit?  No   CCM required this visit?  No   Plan:    I have personally reviewed and noted the following in the patient's chart:   Medical and social history Use of alcohol, tobacco or illicit drugs  Current medications and supplements including opioid prescriptions. Patient is not currently taking opioid prescriptions. Functional ability and status Nutritional status Physical activity Advanced directives List of other physicians Hospitalizations, surgeries, and ER visits in previous 12 months Vitals Screenings to include cognitive, depression, and falls Referrals and appointments  In addition, I have reviewed and discussed with patient certain preventive protocols, quality metrics, and best practice recommendations. A written personalized care plan for preventive services as well as general preventive health recommendations were provided to patient.   Reyanne Hussar L Rani Idler, CMA   03/24/2024   After Visit Summary: (In Person-Printed) AVS printed and given to the patient  Notes: Patient is due for a foot exam and a A1C check and UACR, which can be done during his next office visit.  He stated that he has had a shingles vaccine and a tdap, however it is not documented in [place]  Patient scheduled an office visit for a CPE today.  He had no other concerns to address today.

## 2024-03-24 NOTE — Patient Instructions (Addendum)
 Mr. Erik Aguirre , Thank you for taking time out of your busy schedule to complete your Annual Wellness Visit with me. I enjoyed our conversation and look forward to speaking with you again next year. I, as well as your care team,  appreciate your ongoing commitment to your health goals. Please review the following plan we discussed and let me know if I can assist you in the future. Your Game plan/ To Do List    Follow up Visits: Next Medicare AWV with our clinical staff: 03/27/2025.   Have you seen your provider in the last 6 months (3 months if uncontrolled diabetes)? No Next Office Visit with your provider: 05/01/2024.  Clinician Recommendations:  Aim for 30 minutes of exercise or brisk walking, 6-8 glasses of water, and 5 servings of fruits and vegetables each day. You are due for a foot exam, a A1C check and a kidney evaluation and those can be done at your next office visit.  Keep yp the good work.      This is a list of the screening recommended for you and due dates:  Health Maintenance  Topic Date Due   Medicare Annual Wellness Visit  Never done   DTaP/Tdap/Td vaccine (1 - Tdap) Never done   Zoster (Shingles) Vaccine (1 of 2) Never done   Yearly kidney health urinalysis for diabetes  05/27/2021   Complete foot exam   11/25/2021   COVID-19 Vaccine (1 - 2024-25 season) Never done   Hemoglobin A1C  10/28/2023   Flu Shot  04/07/2024   Yearly kidney function blood test for diabetes  04/26/2024   Eye exam for diabetics  01/23/2025   Pneumococcal Vaccine for age over 34  Completed   Hepatitis B Vaccine  Aged Out   HPV Vaccine  Aged Out   Meningitis B Vaccine  Aged Out    Advanced directives: (Declined) Advance directive discussed with you today. Even though you declined this today, please call our office should you change your mind, and we can give you the proper paperwork for you to fill out. Advance Care Planning is important because it:  [x]  Makes sure you receive the medical care that  is consistent with your values, goals, and preferences  [x]  It provides guidance to your family and loved ones and reduces their decisional burden about whether or not they are making the right decisions based on your wishes.  Follow the link provided in your after visit summary or read over the paperwork we have mailed to you to help you started getting your Advance Directives in place. If you need assistance in completing these, please reach out to us  so that we can help you!  See attachments for Preventive Care and Fall Prevention Tips.

## 2024-04-17 LAB — LAB REPORT - SCANNED
Microalb Creat Ratio: 30
eGFR: 83

## 2024-05-02 ENCOUNTER — Encounter: Payer: Self-pay | Admitting: Emergency Medicine

## 2024-05-02 ENCOUNTER — Ambulatory Visit: Payer: Medicare (Managed Care) | Admitting: Emergency Medicine

## 2024-05-02 VITALS — BP 118/72 | HR 73 | Temp 97.8°F | Ht 67.0 in | Wt 125.0 lb

## 2024-05-02 DIAGNOSIS — Z1329 Encounter for screening for other suspected endocrine disorder: Secondary | ICD-10-CM

## 2024-05-02 DIAGNOSIS — Z130 Encounter for screening for diseases of the blood and blood-forming organs and certain disorders involving the immune mechanism: Secondary | ICD-10-CM

## 2024-05-02 DIAGNOSIS — I152 Hypertension secondary to endocrine disorders: Secondary | ICD-10-CM

## 2024-05-02 DIAGNOSIS — E785 Hyperlipidemia, unspecified: Secondary | ICD-10-CM

## 2024-05-02 DIAGNOSIS — N401 Enlarged prostate with lower urinary tract symptoms: Secondary | ICD-10-CM | POA: Diagnosis not present

## 2024-05-02 DIAGNOSIS — E1159 Type 2 diabetes mellitus with other circulatory complications: Secondary | ICD-10-CM | POA: Diagnosis not present

## 2024-05-02 DIAGNOSIS — Z0001 Encounter for general adult medical examination with abnormal findings: Secondary | ICD-10-CM

## 2024-05-02 DIAGNOSIS — Z7984 Long term (current) use of oral hypoglycemic drugs: Secondary | ICD-10-CM | POA: Diagnosis not present

## 2024-05-02 DIAGNOSIS — Z13228 Encounter for screening for other metabolic disorders: Secondary | ICD-10-CM | POA: Diagnosis not present

## 2024-05-02 DIAGNOSIS — E1169 Type 2 diabetes mellitus with other specified complication: Secondary | ICD-10-CM

## 2024-05-02 DIAGNOSIS — Z125 Encounter for screening for malignant neoplasm of prostate: Secondary | ICD-10-CM | POA: Diagnosis not present

## 2024-05-02 DIAGNOSIS — N138 Other obstructive and reflux uropathy: Secondary | ICD-10-CM | POA: Diagnosis not present

## 2024-05-02 LAB — CBC WITH DIFFERENTIAL/PLATELET
Basophils Absolute: 0 K/uL (ref 0.0–0.1)
Basophils Relative: 0.1 % (ref 0.0–3.0)
Eosinophils Absolute: 0.2 K/uL (ref 0.0–0.7)
Eosinophils Relative: 2.3 % (ref 0.0–5.0)
HCT: 38.1 % — ABNORMAL LOW (ref 39.0–52.0)
Hemoglobin: 12.6 g/dL — ABNORMAL LOW (ref 13.0–17.0)
Lymphocytes Relative: 30.7 % (ref 12.0–46.0)
Lymphs Abs: 2.4 K/uL (ref 0.7–4.0)
MCHC: 33.1 g/dL (ref 30.0–36.0)
MCV: 92.4 fl (ref 78.0–100.0)
Monocytes Absolute: 0.6 K/uL (ref 0.1–1.0)
Monocytes Relative: 7.9 % (ref 3.0–12.0)
Neutro Abs: 4.7 K/uL (ref 1.4–7.7)
Neutrophils Relative %: 59 % (ref 43.0–77.0)
Platelets: 221 K/uL (ref 150.0–400.0)
RBC: 4.12 Mil/uL — ABNORMAL LOW (ref 4.22–5.81)
RDW: 14.1 % (ref 11.5–15.5)
WBC: 7.9 K/uL (ref 4.0–10.5)

## 2024-05-02 LAB — COMPREHENSIVE METABOLIC PANEL WITH GFR
ALT: 18 U/L (ref 0–53)
AST: 19 U/L (ref 0–37)
Albumin: 3.8 g/dL (ref 3.5–5.2)
Alkaline Phosphatase: 40 U/L (ref 39–117)
BUN: 25 mg/dL — ABNORMAL HIGH (ref 6–23)
CO2: 26 meq/L (ref 19–32)
Calcium: 8.8 mg/dL (ref 8.4–10.5)
Chloride: 102 meq/L (ref 96–112)
Creatinine, Ser: 1.09 mg/dL (ref 0.40–1.50)
GFR: 62.9 mL/min (ref >60.00)
Glucose, Bld: 243 mg/dL — ABNORMAL HIGH (ref 70–99)
Potassium: 4.3 meq/L (ref 3.5–5.1)
Sodium: 139 meq/L (ref 135–145)
Total Bilirubin: 0.5 mg/dL (ref 0.2–1.2)
Total Protein: 6.1 g/dL (ref 6.0–8.3)

## 2024-05-02 LAB — PSA: PSA: 4.95 ng/mL — ABNORMAL HIGH (ref 0.10–4.00)

## 2024-05-02 LAB — HEMOGLOBIN A1C: Hgb A1c MFr Bld: 7.8 % — ABNORMAL HIGH (ref 4.6–6.5)

## 2024-05-02 LAB — LIPID PANEL
Cholesterol: 96 mg/dL (ref 0–200)
HDL: 49.9 mg/dL (ref >39.00)
LDL Cholesterol: 23 mg/dL (ref 0–99)
NonHDL: 46.2
Total CHOL/HDL Ratio: 2
Triglycerides: 115 mg/dL (ref 0.0–149.0)
VLDL: 23 mg/dL (ref 0.0–40.0)

## 2024-05-02 NOTE — Assessment & Plan Note (Signed)
Stable chronic conditions Well-controlled diabetes with hemoglobin A1c of 7.2 Continue rosuvastatin 10 mg daily

## 2024-05-02 NOTE — Patient Instructions (Signed)
 Health Maintenance, Male  Adopting a healthy lifestyle and getting preventive care are important in promoting health and wellness. Ask your health care provider about:  The right schedule for you to have regular tests and exams.  Things you can do on your own to prevent diseases and keep yourself healthy.  What should I know about diet, weight, and exercise?  Eat a healthy diet    Eat a diet that includes plenty of vegetables, fruits, low-fat dairy products, and lean protein.  Do not eat a lot of foods that are high in solid fats, added sugars, or sodium.  Maintain a healthy weight  Body mass index (BMI) is a measurement that can be used to identify possible weight problems. It estimates body fat based on height and weight. Your health care provider can help determine your BMI and help you achieve or maintain a healthy weight.  Get regular exercise  Get regular exercise. This is one of the most important things you can do for your health. Most adults should:  Exercise for at least 150 minutes each week. The exercise should increase your heart rate and make you sweat (moderate-intensity exercise).  Do strengthening exercises at least twice a week. This is in addition to the moderate-intensity exercise.  Spend less time sitting. Even light physical activity can be beneficial.  Watch cholesterol and blood lipids  Have your blood tested for lipids and cholesterol at 83 years of age, then have this test every 5 years.  You may need to have your cholesterol levels checked more often if:  Your lipid or cholesterol levels are high.  You are older than 83 years of age.  You are at high risk for heart disease.  What should I know about cancer screening?  Many types of cancers can be detected early and may often be prevented. Depending on your health history and family history, you may need to have cancer screening at various ages. This may include screening for:  Colorectal cancer.  Prostate cancer.  Skin cancer.  Lung  cancer.  What should I know about heart disease, diabetes, and high blood pressure?  Blood pressure and heart disease  High blood pressure causes heart disease and increases the risk of stroke. This is more likely to develop in people who have high blood pressure readings or are overweight.  Talk with your health care provider about your target blood pressure readings.  Have your blood pressure checked:  Every 3-5 years if you are 24-52 years of age.  Every year if you are 3 years old or older.  If you are between the ages of 60 and 72 and are a current or former smoker, ask your health care provider if you should have a one-time screening for abdominal aortic aneurysm (AAA).  Diabetes  Have regular diabetes screenings. This checks your fasting blood sugar level. Have the screening done:  Once every three years after age 66 if you are at a normal weight and have a low risk for diabetes.  More often and at a younger age if you are overweight or have a high risk for diabetes.  What should I know about preventing infection?  Hepatitis B  If you have a higher risk for hepatitis B, you should be screened for this virus. Talk with your health care provider to find out if you are at risk for hepatitis B infection.  Hepatitis C  Blood testing is recommended for:  Everyone born from [birth year] through 1965.  Anyone  with known risk factors for hepatitis C.  Sexually transmitted infections (STIs)  You should be screened each year for STIs, including gonorrhea and chlamydia, if:  You are sexually active and are younger than 83 years of age.  You are older than 83 years of age and your health care provider tells you that you are at risk for this type of infection.  Your sexual activity has changed since you were last screened, and you are at increased risk for chlamydia or gonorrhea. Ask your health care provider if you are at risk.  Ask your health care provider about whether you are at high risk for HIV. Your health care provider  may recommend a prescription medicine to help prevent HIV infection. If you choose to take medicine to prevent HIV, you should first get tested for HIV. You should then be tested every 3 months for as long as you are taking the medicine.  Follow these instructions at home:  Alcohol use  Do not drink alcohol if your health care provider tells you not to drink.  If you drink alcohol:  Limit how much you have to 0-2 drinks a day.  Know how much alcohol is in your drink. In the [place] one drink equals one 12 oz bottle of beer (355 mL), one 5 oz glass of wine (148 mL), or one 1 oz glass of hard liquor (44 mL).  Lifestyle  Do not use any products that contain nicotine or tobacco. These products include cigarettes, chewing tobacco, and vaping devices, such as e-cigarettes. If you need help quitting, ask your health care provider.  Do not use street drugs.  Do not share needles.  Ask your health care provider for help if you need support or information about quitting drugs.  General instructions  Schedule regular health, dental, and eye exams.  Stay current with your vaccines.  Tell your health care provider if:  You often feel depressed.  You have ever been abused or do not feel safe at home.  Summary  Adopting a healthy lifestyle and getting preventive care are important in promoting health and wellness.  Follow your health care provider's instructions about healthy diet, exercising, and getting tested or screened for diseases.  Follow your health care provider's instructions on monitoring your cholesterol and blood pressure.  This information is not intended to replace advice given to you by your health care provider. Make sure you discuss any questions you have with your health care provider.  Document Revised: 01/13/2021 Document Reviewed: 01/13/2021  Elsevier Patient Education  2024 [employer].

## 2024-05-02 NOTE — Assessment & Plan Note (Addendum)
 BP Readings from Last 3 Encounters:  05/02/24 118/72  03/24/24 130/70  04/27/23 122/78   Lab Results  Component Value Date   HGBA1C 7.2 (A) 04/27/2023   Well-controlled hypertension Continues Cardura  2 mg in the morning Well-controlled diabetes Continues metformin  1000 mg twice a day Cardiovascular risks associated with hypertension and diabetes discussed

## 2024-05-02 NOTE — Progress Notes (Signed)
 Erik Aguirre 83 y.o.   Chief Complaint  Patient presents with   Annual Exam    Patient here for physical. Patient mentions having some spots on his right leg  and some on his left leg for about 2 months. Patient would like to have his prostate checked     HISTORY OF PRESENT ILLNESS: This is a 83 y.o. male here for annual exam and follow-up on chronic medical conditions Has history of enlarged prostate and lower urinary tract symptoms.  On medications.  Still having some symptoms. Also mentions spots on his right leg.  No pain or itching No other complaints or medical concerns today.  HPI   Prior to Admission medications   Medication Sig Start Date End Date Taking? Authorizing Provider  doxazosin  (CARDURA ) 2 MG tablet TAKE 1 & 1/2 (ONE & ONE-HALF) TABLETS BY MOUTH ONCE DAILY 12/30/23  Yes Alleigh Mollica, Emil Schanz, MD  ibuprofen  (ADVIL ) 600 MG tablet Take 1 tablet (600 mg total) by mouth every 8 (eight) hours as needed. 09/26/22  Yes Lynwood Lenis, PA-C  metFORMIN  (GLUCOPHAGE ) 1000 MG tablet TAKE 1 TABLET BY MOUTH TWICE DAILY WITH A MEAL 08/30/23  Yes [place], Emil Schanz, MD  mirabegron  ER (MYRBETRIQ ) 50 MG TB24 tablet Take 1 tablet (50 mg total) by mouth daily. 04/27/23  Yes SagardiaEmil Schanz, MD  Multiple Vitamin (MULTIVITAMIN) capsule Take 1 capsule by mouth daily. Reported on 01/15/2016   Yes [provider]  rosuvastatin  (CRESTOR ) 10 MG tablet Take 1 tablet by mouth once daily 09/20/23  Yes Naomia Lenderman, [place], MD  azelastine  (ASTELIN ) 0.1 % nasal spray Place 1 spray into both nostrils 2 (two) times daily. Use in each nostril as directed Patient not taking: Reported on 05/02/2024 04/28/22   [place] Emil Schanz, MD  predniSONE  (DELTASONE ) 10 MG tablet Take 1 tablet (10 mg total) by mouth 3 (three) times daily. Patient not taking: Reported on 05/02/2024 09/26/22   Lynwood Lenis, PA-C    Allergies  Allergen Reactions   Lisinopril Cough   Terazosin  Hcl Other (See Comments)     Severe orthostatic hypotension resulting in falls    Patient Active Problem List   Diagnosis Date Noted   Acute cough 11/11/2022   Dyslipidemia associated with type 2 diabetes mellitus (HCC) 05/27/2020   Dyslipidemia 11/01/2014   Hypertension associated with diabetes (HCC) 07/02/2014   Diabetes mellitus with no complication (HCC) 07/02/2014   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   Benign prostatic hyperplasia (BPH) with urinary urgency 03/17/2013   BPH with obstruction/lower urinary tract symptoms 03/17/2013    Past Medical History:  Diagnosis Date   Acute appendicitis s/p alp appy 11/27/2014 11/26/2014   BPH (benign prostatic hyperplasia)    Hyperlipidemia    Hypertension    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    Past Surgical History:  Procedure Laterality Date   HERNIA REPAIR     LAPAROSCOPIC APPENDECTOMY N/A 11/27/2014   Procedure: APPENDECTOMY LAPAROSCOPIC ;  Surgeon: Elspeth Schultze, MD;  Location: [facility];  Service: General;  Laterality: N/A;    Social History   Socioeconomic History   Marital status: Married    Spouse name: Not on file   Number of children: 5   Years of education: Not on file   Highest education level: Not on file  Occupational History   Occupation: Retired  Tobacco Use   Smoking status: Never   Smokeless tobacco: Never  Vaping Use   Vaping status: Never Used  Substance and Sexual Activity   Alcohol  use: Yes    Comment: occ   Drug use: No   Sexual activity: Not Currently  Other Topics Concern   Not on file  Social History Narrative   Originally from [place].   Language of English okay   Social Drivers of Health   Financial Resource Strain: Low Risk  (03/24/2024)   Overall Financial Resource Strain (CARDIA)    Difficulty of Paying Living Expenses: Not very hard  Food Insecurity: No Food Insecurity (03/24/2024)   Hunger Vital Sign    Worried About Running Out of Food in the Last Year: Never true    Ran Out of Food in  the Last Year: Never true  Transportation Needs: No Transportation Needs (03/24/2024)   PRAPARE - Administrator, Civil Service (Medical): No    Lack of Transportation (Non-Medical): No  Physical Activity: Sufficiently Active (03/24/2024)   Exercise Vital Sign    Days of Exercise per Week: 7 days    Minutes of Exercise per Session: 30 min  Stress: No Stress Concern Present (03/24/2024)   [employer] of Occupational Health - Occupational Stress Questionnaire    Feeling of Stress: Only a little  Social Connections: Moderately Integrated (03/24/2024)   Social Connection and Isolation Panel    Frequency of Communication with Friends and Family: More than three times a week    Frequency of Social Gatherings with Friends and Family: Twice a week    Attends Religious Services: More than 4 times per year    Active Member of [employer] or Organizations: No    Attends Banker Meetings: Never    Marital Status: Married  Catering manager Violence: Not At Risk (03/24/2024)   Humiliation, Afraid, Rape, and Kick questionnaire    Fear of Current or Ex-Partner: No    Emotionally Abused: No    Physically Abused: No    Sexually Abused: No    Family History  Problem Relation Age of Onset   Throat cancer Brother    Asthma Son      Review of Systems  Constitutional: Negative.  Negative for chills and fever.  HENT: Negative.  Negative for congestion and sore throat.   Respiratory: Negative.  Negative for cough and shortness of breath.   Cardiovascular: Negative.  Negative for chest pain and palpitations.  Gastrointestinal:  Negative for abdominal pain, diarrhea, nausea and vomiting.  Genitourinary:  Positive for frequency. Negative for dysuria.       Nocturia  Skin: Negative.  Negative for rash.  Neurological: Negative.  Negative for dizziness and headaches.  All other systems reviewed and are negative.   Vitals:   05/02/24 1405  BP: 118/72  Pulse: 73  Temp: 97.8 F  (36.6 C)  SpO2: 95%    Physical Exam Vitals reviewed.  Constitutional:      Appearance: Normal appearance.  HENT:     Head: Normocephalic.     Mouth/Throat:     Mouth: Mucous membranes are moist.     Pharynx: Oropharynx is clear.  Eyes:     Extraocular Movements: Extraocular movements intact.     Conjunctiva/sclera: Conjunctivae normal.     Pupils: Pupils are equal, round, and reactive to light.  Cardiovascular:     Rate and Rhythm: Normal rate and regular rhythm.     Pulses: Normal pulses.     Heart sounds: Normal heart sounds.  Pulmonary:     Effort: Pulmonary effort is normal.     Breath sounds:  Normal breath sounds.  Abdominal:     Palpations: Abdomen is soft.     Tenderness: There is no abdominal tenderness.  Musculoskeletal:     Cervical back: No tenderness.     Right lower leg: No edema.     Left lower leg: No edema.  Lymphadenopathy:     Cervical: No cervical adenopathy.  Skin:    General: Skin is warm and dry.     Capillary Refill: Capillary refill takes less than 2 seconds.     Comments: Dark brown round flat spot to right lower extremity.  Nontender.  Benign appearance.  Neurological:     General: No focal deficit present.     Mental Status: He is alert and oriented to person, place, and time.  Psychiatric:        Mood and Affect: Mood normal.        Behavior: Behavior normal.      ASSESSMENT & PLAN: Problem List Items Addressed This Visit       Cardiovascular and Mediastinum   Hypertension associated with diabetes (HCC)   BP Readings from Last 3 Encounters:  05/02/24 118/72  03/24/24 130/70  04/27/23 122/78   Lab Results  Component Value Date   HGBA1C 7.2 (A) 04/27/2023   Well-controlled hypertension Continues Cardura  2 mg in the morning Well-controlled diabetes Continues metformin  1000 mg twice a day Cardiovascular risks associated with hypertension and diabetes discussed      Relevant Orders   Comprehensive metabolic panel with GFR    Hemoglobin A1c     Endocrine   Dyslipidemia associated with type 2 diabetes mellitus (HCC)   Stable chronic conditions Well-controlled diabetes with hemoglobin A1c of 7.2 Continue rosuvastatin  10 mg daily      Relevant Orders   Comprehensive metabolic panel with GFR   Hemoglobin A1c   Lipid panel     Genitourinary   BPH with obstruction/lower urinary tract symptoms   Symptoms still active Continues Myrbetriq  50 mg daily Needs follow-up with urologist Continues doxazosin  2 mg daily      Other Visit Diagnoses       Encounter for general adult medical examination with abnormal findings    -  Primary   Relevant Orders   PSA   CBC with Differential/Platelet   Comprehensive metabolic panel with GFR   Hemoglobin A1c   Lipid panel     Screening for deficiency anemia       Relevant Orders   CBC with Differential/Platelet     Screening for endocrine, metabolic and immunity disorder       Relevant Orders   Comprehensive metabolic panel with GFR     Screening for prostate cancer       Relevant Orders   PSA      Modifiable risk factors discussed with patient. Anticipatory guidance according to age provided. The following topics were also discussed: Social Determinants of Health Smoking.  Non-smoker Diet and nutrition Benefits of exercise Cancer screening  Vaccinations review and recommendations Cardiovascular risk assessment and need for blood work Mental health including depression and anxiety Fall and accident prevention  Patient Instructions  Health Maintenance, Male Adopting a healthy lifestyle and getting preventive care are important in promoting health and wellness. Ask your health care provider about: The right schedule for you to have regular tests and exams. Things you can do on your own to prevent diseases and keep yourself healthy. What should I know about diet, weight, and exercise? Eat a healthy diet  Eat  a diet that includes plenty of vegetables,  fruits, low-fat dairy products, and lean protein. Do not eat a lot of foods that are high in solid fats, added sugars, or sodium. Maintain a healthy weight Body mass index (BMI) is a measurement that can be used to identify possible weight problems. It estimates body fat based on height and weight. Your health care provider can help determine your BMI and help you achieve or maintain a healthy weight. Get regular exercise Get regular exercise. This is one of the most important things you can do for your health. Most adults should: Exercise for at least 150 minutes each week. The exercise should increase your heart rate and make you sweat (moderate-intensity exercise). Do strengthening exercises at least twice a week. This is in addition to the moderate-intensity exercise. Spend less time sitting. Even light physical activity can be beneficial. Watch cholesterol and blood lipids Have your blood tested for lipids and cholesterol at 83 years of age, then have this test every 5 years. You may need to have your cholesterol levels checked more often if: Your lipid or cholesterol levels are high. You are older than 83 years of age. You are at high risk for heart disease. What should I know about cancer screening? Many types of cancers can be detected early and may often be prevented. Depending on your health history and family history, you may need to have cancer screening at various ages. This may include screening for: Colorectal cancer. Prostate cancer. Skin cancer. Lung cancer. What should I know about heart disease, diabetes, and high blood pressure? Blood pressure and heart disease High blood pressure causes heart disease and increases the risk of stroke. This is more likely to develop in people who have high blood pressure readings or are overweight. Talk with your health care provider about your target blood pressure readings. Have your blood pressure checked: Every 3-5 years if you are  69-53 years of age. Every year if you are 52 years old or older. If you are between the ages of 72 and 12 and are a current or former smoker, ask your health care provider if you should have a one-time screening for abdominal aortic aneurysm (AAA). Diabetes Have regular diabetes screenings. This checks your fasting blood sugar level. Have the screening done: Once every three years after age 46 if you are at a normal weight and have a low risk for diabetes. More often and at a younger age if you are overweight or have a high risk for diabetes. What should I know about preventing infection? Hepatitis B If you have a higher risk for hepatitis B, you should be screened for this virus. Talk with your health care provider to find out if you are at risk for hepatitis B infection. Hepatitis C Blood testing is recommended for: Everyone born from [birth year] through 1965. Anyone with known risk factors for hepatitis C. Sexually transmitted infections (STIs) You should be screened each year for STIs, including gonorrhea and chlamydia, if: You are sexually active and are younger than 83 years of age. You are older than 83 years of age and your health care provider tells you that you are at risk for this type of infection. Your sexual activity has changed since you were last screened, and you are at increased risk for chlamydia or gonorrhea. Ask your health care provider if you are at risk. Ask your health care provider about whether you are at high risk for HIV. Your health care provider  may recommend a prescription medicine to help prevent HIV infection. If you choose to take medicine to prevent HIV, you should first get tested for HIV. You should then be tested every 3 months for as long as you are taking the medicine. Follow these instructions at home: Alcohol use Do not drink alcohol if your health care provider tells you not to drink. If you drink alcohol: Limit how much you have to 0-2 drinks a day. Know  how much alcohol is in your drink. In the [place] one drink equals one 12 oz bottle of beer (355 mL), one 5 oz glass of wine (148 mL), or one 1 oz glass of hard liquor (44 mL). Lifestyle Do not use any products that contain nicotine or tobacco. These products include cigarettes, chewing tobacco, and vaping devices, such as e-cigarettes. If you need help quitting, ask your health care provider. Do not use street drugs. Do not share needles. Ask your health care provider for help if you need support or information about quitting drugs. General instructions Schedule regular health, dental, and eye exams. Stay current with your vaccines. Tell your health care provider if: You often feel depressed. You have ever been abused or do not feel safe at home. Summary Adopting a healthy lifestyle and getting preventive care are important in promoting health and wellness. Follow your health care provider's instructions about healthy diet, exercising, and getting tested or screened for diseases. Follow your health care provider's instructions on monitoring your cholesterol and blood pressure. This information is not intended to replace advice given to you by your health care provider. Make sure you discuss any questions you have with your health care provider. Document Revised: 01/13/2021 Document Reviewed: 01/13/2021 Elsevier Patient Education  2024 Elsevier Inc.     Emil Schaumann, MD [facility]

## 2024-05-02 NOTE — Assessment & Plan Note (Signed)
 Symptoms still active Continues Myrbetriq  50 mg daily Needs follow-up with urologist Continues doxazosin  2 mg daily

## 2024-05-03 ENCOUNTER — Ambulatory Visit: Payer: Self-pay | Admitting: Emergency Medicine

## 2024-05-05 ENCOUNTER — Other Ambulatory Visit (HOSPITAL_COMMUNITY): Payer: Self-pay

## 2024-07-19 ENCOUNTER — Encounter: Payer: Self-pay | Admitting: Pharmacist

## 2024-07-19 NOTE — Progress Notes (Signed)
 Pharmacy Quality Measure Review  This patient is appearing on a report for being at risk of failing the adherence measure for cholesterol (statin) medications this calendar year.   Medication: rosuvastatin  Last fill date: 06/04/24 for 90 day supply  Pt currently up to date on rosuvastatin  refill, however will fail MAC this year due to late refills earlier this year. Insurance report was not up to date. No action needed at this time.   Darrelyn Drum, PharmD, BCPS, CPP Clinical Pharmacist Practitioner [facility] [phone]

## 2024-08-13 ENCOUNTER — Other Ambulatory Visit: Payer: Self-pay | Admitting: Emergency Medicine

## 2024-09-01 ENCOUNTER — Other Ambulatory Visit: Payer: Self-pay | Admitting: Emergency Medicine

## 2024-09-01 DIAGNOSIS — N401 Enlarged prostate with lower urinary tract symptoms: Secondary | ICD-10-CM

## 2025-03-27 ENCOUNTER — Ambulatory Visit: Payer: Medicare (Managed Care)
